# Patient Record
Sex: Female | Born: 1965 | Race: Black or African American | Hispanic: No | Marital: Married | Smoking: Never smoker
Health system: Southern US, Community
[De-identification: ages and names within clinical notes are randomized; demographics above are authoritative.]

## PROBLEM LIST (undated history)

## (undated) DIAGNOSIS — D219 Benign neoplasm of connective and other soft tissue, unspecified: Secondary | ICD-10-CM

## (undated) DIAGNOSIS — E559 Vitamin D deficiency, unspecified: Secondary | ICD-10-CM

## (undated) DIAGNOSIS — E669 Obesity, unspecified: Secondary | ICD-10-CM

## (undated) DIAGNOSIS — E039 Hypothyroidism, unspecified: Secondary | ICD-10-CM

## (undated) DIAGNOSIS — E049 Nontoxic goiter, unspecified: Secondary | ICD-10-CM

## (undated) DIAGNOSIS — G43909 Migraine, unspecified, not intractable, without status migrainosus: Secondary | ICD-10-CM

## (undated) DIAGNOSIS — R112 Nausea with vomiting, unspecified: Secondary | ICD-10-CM

## (undated) DIAGNOSIS — I10 Essential (primary) hypertension: Secondary | ICD-10-CM

## (undated) DIAGNOSIS — R51 Headache: Secondary | ICD-10-CM

## (undated) DIAGNOSIS — Z9889 Other specified postprocedural states: Secondary | ICD-10-CM

## (undated) HISTORY — DX: Nontoxic goiter, unspecified: E04.9

## (undated) HISTORY — PX: THYROIDECTOMY: SHX17

## (undated) HISTORY — PX: ABDOMINAL HYSTERECTOMY: SHX81

## (undated) HISTORY — DX: Migraine, unspecified, not intractable, without status migrainosus: G43.909

## (undated) HISTORY — DX: Essential (primary) hypertension: I10

## (undated) HISTORY — PX: DILATION AND CURETTAGE OF UTERUS: SHX78

## (undated) HISTORY — DX: Obesity, unspecified: E66.9

## (undated) HISTORY — DX: Benign neoplasm of connective and other soft tissue, unspecified: D21.9

## (undated) HISTORY — DX: Vitamin D deficiency, unspecified: E55.9

## (undated) HISTORY — DX: Headache: R51

---

## 1998-12-31 ENCOUNTER — Other Ambulatory Visit: Admission: RE | Admit: 1998-12-31 | Discharge: 1998-12-31 | Payer: Self-pay | Admitting: Obstetrics & Gynecology

## 2000-10-08 ENCOUNTER — Other Ambulatory Visit: Admission: RE | Admit: 2000-10-08 | Discharge: 2000-10-08 | Payer: Self-pay | Admitting: Obstetrics and Gynecology

## 2001-03-08 ENCOUNTER — Encounter: Admission: RE | Admit: 2001-03-08 | Discharge: 2001-03-08 | Payer: Self-pay | Admitting: Internal Medicine

## 2001-03-08 ENCOUNTER — Encounter: Payer: Self-pay | Admitting: Internal Medicine

## 2002-07-29 ENCOUNTER — Ambulatory Visit (HOSPITAL_COMMUNITY): Admission: RE | Admit: 2002-07-29 | Discharge: 2002-07-29 | Payer: Self-pay | Admitting: Obstetrics and Gynecology

## 2002-07-29 ENCOUNTER — Encounter (INDEPENDENT_AMBULATORY_CARE_PROVIDER_SITE_OTHER): Payer: Self-pay | Admitting: *Deleted

## 2002-08-08 ENCOUNTER — Other Ambulatory Visit: Admission: RE | Admit: 2002-08-08 | Discharge: 2002-08-08 | Payer: Self-pay | Admitting: Obstetrics and Gynecology

## 2003-10-18 ENCOUNTER — Other Ambulatory Visit: Admission: RE | Admit: 2003-10-18 | Discharge: 2003-10-18 | Payer: Self-pay | Admitting: Gynecology

## 2004-09-04 ENCOUNTER — Inpatient Hospital Stay (HOSPITAL_COMMUNITY): Admission: AD | Admit: 2004-09-04 | Discharge: 2004-09-04 | Payer: Self-pay | Admitting: Gynecology

## 2004-10-17 ENCOUNTER — Encounter (INDEPENDENT_AMBULATORY_CARE_PROVIDER_SITE_OTHER): Payer: Self-pay | Admitting: Specialist

## 2004-10-17 ENCOUNTER — Ambulatory Visit (HOSPITAL_BASED_OUTPATIENT_CLINIC_OR_DEPARTMENT_OTHER): Admission: RE | Admit: 2004-10-17 | Discharge: 2004-10-17 | Payer: Self-pay | Admitting: Gynecology

## 2004-10-17 ENCOUNTER — Ambulatory Visit (HOSPITAL_COMMUNITY): Admission: RE | Admit: 2004-10-17 | Discharge: 2004-10-17 | Payer: Self-pay | Admitting: Gynecology

## 2007-04-20 ENCOUNTER — Ambulatory Visit: Payer: Self-pay | Admitting: Internal Medicine

## 2007-04-20 LAB — CONVERTED CEMR LAB
AST: 17 units/L (ref 0–37)
Basophils Relative: 0.9 % (ref 0.0–1.0)
CO2: 31 meq/L (ref 19–32)
Creatinine, Ser: 0.7 mg/dL (ref 0.4–1.2)
Eosinophils Relative: 2 % (ref 0.0–5.0)
Glucose, Bld: 80 mg/dL (ref 70–99)
Lymphocytes Relative: 27.8 % (ref 12.0–46.0)
MCV: 83 fL (ref 78.0–100.0)
Monocytes Absolute: 0.7 10*3/uL (ref 0.2–0.7)
Monocytes Relative: 10.9 % (ref 3.0–11.0)
Neutrophils Relative %: 58.4 % (ref 43.0–77.0)
Potassium: 4 meq/L (ref 3.5–5.1)
RDW: 12.6 % (ref 11.5–14.6)
Sodium: 141 meq/L (ref 135–145)
Total Bilirubin: 0.7 mg/dL (ref 0.3–1.2)
Total Protein: 6.3 g/dL (ref 6.0–8.3)

## 2007-06-14 DIAGNOSIS — R51 Headache: Secondary | ICD-10-CM

## 2007-06-14 DIAGNOSIS — R519 Headache, unspecified: Secondary | ICD-10-CM | POA: Insufficient documentation

## 2007-06-14 DIAGNOSIS — I10 Essential (primary) hypertension: Secondary | ICD-10-CM

## 2007-06-14 HISTORY — DX: Headache: R51

## 2007-06-14 HISTORY — DX: Essential (primary) hypertension: I10

## 2008-02-02 ENCOUNTER — Encounter: Admission: RE | Admit: 2008-02-02 | Discharge: 2008-02-02 | Payer: Self-pay | Admitting: Internal Medicine

## 2008-05-11 ENCOUNTER — Telehealth: Payer: Self-pay | Admitting: Internal Medicine

## 2008-12-22 ENCOUNTER — Emergency Department (HOSPITAL_COMMUNITY): Admission: EM | Admit: 2008-12-22 | Discharge: 2008-12-22 | Payer: Self-pay | Admitting: Internal Medicine

## 2010-03-14 ENCOUNTER — Ambulatory Visit: Payer: Self-pay | Admitting: Internal Medicine

## 2010-03-14 DIAGNOSIS — E669 Obesity, unspecified: Secondary | ICD-10-CM

## 2010-03-14 HISTORY — DX: Obesity, unspecified: E66.9

## 2010-03-14 LAB — CONVERTED CEMR LAB
ALT: 18 units/L (ref 0–35)
BUN: 16 mg/dL (ref 6–23)
Basophils Relative: 0.3 % (ref 0.0–3.0)
Bilirubin, Direct: 0.2 mg/dL (ref 0.0–0.3)
CO2: 24 meq/L (ref 19–32)
Chloride: 106 meq/L (ref 96–112)
Cholesterol: 132 mg/dL (ref 0–200)
Creatinine, Ser: 0.7 mg/dL (ref 0.4–1.2)
Eosinophils Absolute: 0.2 10*3/uL (ref 0.0–0.7)
Eosinophils Relative: 1.9 % (ref 0.0–5.0)
HCT: 40.8 % (ref 36.0–46.0)
Lymphs Abs: 2.4 10*3/uL (ref 0.7–4.0)
MCHC: 34 g/dL (ref 30.0–36.0)
MCV: 82.9 fL (ref 78.0–100.0)
Monocytes Absolute: 0.7 10*3/uL (ref 0.1–1.0)
Neutrophils Relative %: 60.8 % (ref 43.0–77.0)
Platelets: 280 10*3/uL (ref 150.0–400.0)
Potassium: 5 meq/L (ref 3.5–5.1)
RBC: 4.93 M/uL (ref 3.87–5.11)
TSH: 1.45 microintl units/mL (ref 0.35–5.50)
Total Protein: 6.9 g/dL (ref 6.0–8.3)
Triglycerides: 68 mg/dL (ref 0.0–149.0)

## 2010-03-28 ENCOUNTER — Telehealth: Payer: Self-pay

## 2010-04-12 ENCOUNTER — Telehealth: Payer: Self-pay

## 2010-04-18 ENCOUNTER — Ambulatory Visit: Payer: Self-pay | Admitting: Internal Medicine

## 2010-11-10 ENCOUNTER — Encounter: Payer: Self-pay | Admitting: Internal Medicine

## 2010-11-21 NOTE — Assessment & Plan Note (Signed)
Summary: cpx-will fast//ccm   Vital Signs:  Patient profile:   45 year old female Height:      63.75 inches Weight:      239 pounds BMI:     41.50 Temp:     98.4 degrees F oral BP sitting:   120 / 80  (right arm) Cuff size:   regular  Vitals Entered By: Duard Brady LPN (Mar 14, 2010 8:54 AM) CC: cpx - has gyn , c/o migraines , wants to discuss wt loss Is Patient Diabetic? No   CC:  cpx - has gyn , c/o migraines , and wants to discuss wt loss.  History of Present Illness:  45 year old patient who is seen today for a health  maintenance examination.  She has a long history of treated hypertension.  She presently is well controlled on metoprolol 50 mg daily.  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 Her main concern is frequent migraine headaches.  These have intensified of late and has missed several days of work.  She has been on preventive medications in the past.  Presently, she is using anti-inflammatories, and Excedrin Migraine that no longer are providing much benefit  Preventive Screening-Counseling & Management  Alcohol-Tobacco     Smoking Status: never  Allergies (verified): No Known Drug Allergies  Past History:  Past Medical History: Headache-migraine Hypertension exogenous obesity  Past Surgical History: Reviewed history from 06/14/2007 and no changes required. G2 P2 A0 D&C x 2  Family History: Reviewed history and no changes required. father in good health mother history of hypertension, type 2 diabetes two brothers, one sister positive for hypertension and afor  Social History: Reviewed history and no changes required. works as a Lawyer at [employer] Status:  never  Review of Systems       The patient complains of weight gain and headaches.  The patient denies anorexia, fever, weight loss, vision loss, decreased hearing, hoarseness, chest pain, syncope, dyspnea on exertion,  peripheral edema, prolonged cough, hemoptysis, abdominal pain, melena, hematochezia, severe indigestion/heartburn, hematuria, incontinence, genital sores, muscle weakness, suspicious skin lesions, transient blindness, difficulty walking, depression, unusual weight change, abnormal bleeding, enlarged lymph nodes, angioedema, and breast masses.    Physical Exam  General:  overweight-appearing.  124/80overweight-appearing.   Head:  Normocephalic and atraumatic without obvious abnormalities. No apparent alopecia or balding. Eyes:  No corneal or conjunctival inflammation noted. EOMI. Perrla. Funduscopic exam benign, without hemorrhages, exudates or papilledema. Vision grossly normal. Ears:  External ear exam shows no significant lesions or deformities.  Otoscopic examination reveals clear canals, tympanic membranes are intact bilaterally without bulging, retraction, inflammation or discharge. Hearing is grossly normal bilaterally. Nose:  External nasal examination shows no deformity or inflammation. Nasal mucosa are pink and moist without lesions or exudates. Mouth:  Oral mucosa and oropharynx without lesions or exudates.  Teeth in good repair. Neck:  thyromegaly Chest Wall:  No deformities, masses, or tenderness noted. Lungs:  Normal respiratory effort, chest expands symmetrically. Lungs are clear to auscultation, no crackles or wheezes. Heart:  Normal rate and regular rhythm. S1 and S2 normal without gallop, murmur, click, rub or other extra sounds. Abdomen:  Bowel sounds positive,abdomen soft and non-tender without masses, organomegaly or hernias noted. Msk:  No deformity or scoliosis noted of thoracic or lumbar spine.   Pulses:  R and L carotid,radial,femoral,dorsalis pedis and posterior tibial pulses are full and equal bilaterally Extremities:  No clubbing, cyanosis, edema,  or deformity noted with normal full range of motion of all joints.   Neurologic:  alert & oriented X3, cranial nerves II-XII  intact, strength normal in all extremities, sensation intact to light touch, and gait normal.  alert & oriented X3, cranial nerves II-XII intact, strength normal in all extremities, sensation intact to light touch, and gait normal.   Skin:  Intact without suspicious lesions or rashes Cervical Nodes:  No lymphadenopathy noted Axillary Nodes:  No palpable lymphadenopathy Inguinal Nodes:  No significant adenopathy Psych:  Cognition and judgment appear intact. Alert and cooperative with normal attention span and concentration. No apparent delusions, illusions, hallucinations   Impression & Recommendations:  Problem # 1:  HYPERTENSION (ICD-401.9)  Her updated medication list for this problem includes:    Metoprolol Tartrate 50 Mg Tabs (Metoprolol tartrate) .Marland Kitchen... Take 1 tablet by mouth once a day  Her updated medication list for this problem includes:    Metoprolol Tartrate 50 Mg Tabs (Metoprolol tartrate) .Marland Kitchen... Take 1 tablet by mouth once a day  Problem # 2:  HEADACHE (ICD-784.0)  Her updated medication list for this problem includes:    Metoprolol Tartrate 50 Mg Tabs (Metoprolol tartrate) .Marland Kitchen... Take 1 tablet by mouth once a day    Sumatriptan Succinate 50 Mg Tabs (Sumatriptan succinate) ..... One tablet as needed for migraine headaches  Her updated medication list for this problem includes:    Metoprolol Tartrate 50 Mg Tabs (Metoprolol tartrate) .Marland Kitchen... Take 1 tablet by mouth once a day    Sumatriptan Succinate 50 Mg Tabs (Sumatriptan succinate) ..... One tablet as needed for migraine headaches  Problem # 3:  EXOGENOUS OBESITY (ICD-278.00)  Complete Medication List: 1)  Metoprolol Tartrate 50 Mg Tabs (Metoprolol tartrate) .... Take 1 tablet by mouth once a day 2)  Topiramate 50 Mg Tabs (Topiramate) .... One at bedtime for 7 days, then one twice daily for 7 days, then one in the morning and two at bedtime for 7 days 3)  Topiramate 100 Mg Tabs (Topiramate) .... One twice daily 4)   Sumatriptan Succinate 50 Mg Tabs (Sumatriptan succinate) .... One tablet as needed for migraine headaches  Other Orders: Venipuncture (16109) TLB-Lipid Panel (80061-LIPID) TLB-BMP (Basic Metabolic Panel-BMET) (80048-METABOL) TLB-CBC Platelet - w/Differential (85025-CBCD) TLB-Hepatic/Liver Function Pnl (80076-HEPATIC) TLB-TSH (Thyroid Stimulating Hormone) (60454-UJW)  Patient Instructions: 1)  Please schedule a follow-up appointment in 6 weeks 2)  Limit your Sodium (Salt) to less than 2 grams a day(slightly less than 1/2 a teaspoon) to prevent fluid retention, swelling, or worsening of symptoms. 3)  It is important that you exercise regularly at least 20 minutes 5 times a week. If you develop chest pain, have severe difficulty breathing, or feel very tired , stop exercising immediately and seek medical attention. 4)  You need to lose weight. Consider a lower calorie diet and regular exercise.  Prescriptions: SUMATRIPTAN SUCCINATE 50 MG TABS (SUMATRIPTAN SUCCINATE) one tablet as needed for migraine headaches  #6 x 6   Entered and Authorized by:   Gordy Savers  MD   Signed by:   Gordy Savers  MD on 03/14/2010   Method used:   Electronically to        [facility]* (retail)       [address]       [address] Shipping/mailing       [place], [place]  [address]       Ph: [phone]       Fax:  [phone]   RxID:   [id number] TOPIRAMATE 100 MG TABS (TOPIRAMATE) one twice daily  #180 x 2   Entered and Authorized by:   Gordy Savers  MD   Signed by:   Gordy Savers  MD on 03/14/2010   Method used:   Electronically to        [facility]* (retail)       [address]       [address] Shipping/mailing       [place], [place]  [address]       Ph: [phone]       Fax: [phone]   RxID:   [id number] TOPIRAMATE 50 MG TABS (TOPIRAMATE) one at bedtime for 7 days, then one twice daily for 7 days, then one in the  morning and two at bedtime for 7 days  #42 x 0   Entered and Authorized by:   Gordy Savers  MD   Signed by:   Gordy Savers  MD on 03/14/2010   Method used:   Electronically to        [facility]* (retail)       [address]       [address] Shipping/mailing       [place], [place]  [address]       Ph: [phone]       Fax: [phone]   RxID:   [id number] METOPROLOL TARTRATE 50 MG  TABS (METOPROLOL TARTRATE) Take 1 tablet by mouth once a day  #90 x 4   Entered and Authorized by:   Gordy Savers  MD   Signed by:   Gordy Savers  MD on 03/14/2010   Method used:   Electronically to        [facility]* (retail)       [address]       [address] Shipping/mailing       [place], [place]  [address]       Ph: [phone]       Fax: [phone]   RxID:   [id number]

## 2010-11-21 NOTE — Assessment & Plan Note (Signed)
Summary: 6 week fup//ccm   Vital Signs:  Patient profile:   45 year old female Weight:      240 pounds Temp:     97.7 degrees F oral BP sitting:   110 / 80  (right arm) Cuff size:   regular  Vitals Entered By: Duard Brady LPN (April 18, 2010 8:34 AM) CC: 6 wk rov - better Is Patient Diabetic? No   CC:  6 wk rov - better.  History of Present Illness: 45 year old patient who is seen today for follow-up of her migraine headaches.  she was placed on Topamax last visit and has up titrated her medication to 100 mg b.i.d. she has treated hypertension, which has been stable.  This is well controlled on metoprolol.  Headaches are much improved.  They occur just twice a week now rather than 4 to 5 times weekly and are much less severe  Preventive Screening-Counseling & Management  Alcohol-Tobacco     Smoking Status: never  Allergies (verified): No Known Drug Allergies  Past History:  Past Medical History: Reviewed history from 03/14/2010 and no changes required. Headache-migraine Hypertension exogenous obesity  Review of Systems       The patient complains of headaches.  The patient denies anorexia, fever, weight loss, weight gain, vision loss, decreased hearing, hoarseness, chest pain, syncope, dyspnea on exertion, peripheral edema, prolonged cough, hemoptysis, abdominal pain, melena, hematochezia, severe indigestion/heartburn, hematuria, incontinence, genital sores, muscle weakness, suspicious skin lesions, transient blindness, difficulty walking, depression, unusual weight change, abnormal bleeding, enlarged lymph nodes, angioedema, and breast masses.    Physical Exam  General:  overweight-appearing.  110/70overweight-appearing.   Neck:  No deformities, masses, or tenderness noted. Lungs:  Normal respiratory effort, chest expands symmetrically. Lungs are clear to auscultation, no crackles or wheezes. Heart:  Normal rate and regular rhythm. S1 and S2 normal without gallop,  murmur, click, rub or other extra sounds.   Impression & Recommendations:  Problem # 1:  HYPERTENSION (ICD-401.9)  Her updated medication list for this problem includes:    Metoprolol Tartrate 50 Mg Tabs (Metoprolol tartrate) .Marland Kitchen... Take 1 tablet by mouth once a day  Her updated medication list for this problem includes:    Metoprolol Tartrate 50 Mg Tabs (Metoprolol tartrate) .Marland Kitchen... Take 1 tablet by mouth once a day  Problem # 2:  HEADACHE (ICD-784.0)  Her updated medication list for this problem includes:    Metoprolol Tartrate 50 Mg Tabs (Metoprolol tartrate) .Marland Kitchen... Take 1 tablet by mouth once a day    Sumatriptan Succinate 50 Mg Tabs (Sumatriptan succinate) ..... One tablet as needed for migraine headaches  Her updated medication list for this problem includes:    Metoprolol Tartrate 50 Mg Tabs (Metoprolol tartrate) .Marland Kitchen... Take 1 tablet by mouth once a day    Sumatriptan Succinate 50 Mg Tabs (Sumatriptan succinate) ..... One tablet as needed for migraine headaches  Complete Medication List: 1)  Metoprolol Tartrate 50 Mg Tabs (Metoprolol tartrate) .... Take 1 tablet by mouth once a day 2)  Topiramate 50 Mg Tabs (Topiramate) .... One at bedtime for 7 days, then one twice daily for 7 days, then one in the morning and two at bedtime for 7 days 3)  Topiramate 100 Mg Tabs (Topiramate) .... One twice daily 4)  Sumatriptan Succinate 50 Mg Tabs (Sumatriptan succinate) .... One tablet as needed for migraine headaches  Patient Instructions: 1)  Please schedule a follow-up appointment in 3 months. 2)  Limit your Sodium (Salt) to less than 2  grams a day(slightly less than 1/2 a teaspoon) to prevent fluid retention, swelling, or worsening of symptoms. 3)  It is important that you exercise regularly at least 20 minutes 5 times a week. If you develop chest pain, have severe difficulty breathing, or feel very tired , stop exercising immediately and seek medical attention. 4)  You need to lose weight.  Consider a lower calorie diet and regular exercise.

## 2010-11-21 NOTE — Progress Notes (Signed)
Summary: ppwk ready for pick up  Phone Note Outgoing Call   Call placed by: Duard Brady LPN,  March 28, 8118 9:24 AM Call placed to: Patient Summary of Call: FMLA ppwk ready for pick up - ans mach. KIK Initial call taken by: Duard Brady LPN,  March 28, 1477 9:24 AM

## 2010-11-21 NOTE — Progress Notes (Signed)
Summary: lab results needed  Phone Note Call from Patient Call back at [phone]   Caller: Patient---voicemail Reason for Call: Talk to Nurse, Lab or Test Results Summary of Call: need lab results. Initial call taken by: Warnell Forester,  April 12, 2010 1:02 PM  Follow-up for Phone Call        Informed pt Dr Kirtland Bouchard out of the office, 5/26 labs no comment.  She will get a call early next with results when he returns Follow-up by: Sid Falcon LPN,  April 12, 2010 3:31 PM  Additional Follow-up for Phone Call Additional follow up Details #1::        called pt about labs - all WNL   KIK Additional Follow-up by: Duard Brady LPN,  April 15, 2010 9:08 AM

## 2011-01-30 LAB — CBC
Platelets: 280 10*3/uL (ref 150–400)
RBC: 5.51 MIL/uL — ABNORMAL HIGH (ref 3.87–5.11)
WBC: 9.1 10*3/uL (ref 4.0–10.5)

## 2011-01-30 LAB — BASIC METABOLIC PANEL
BUN: 10 mg/dL (ref 6–23)
Calcium: 9.3 mg/dL (ref 8.4–10.5)
Chloride: 102 mEq/L (ref 96–112)
Creatinine, Ser: 0.84 mg/dL (ref 0.4–1.2)
GFR calc Af Amer: >60 mL/min (ref >60)
GFR calc non Af Amer: >60 mL/min (ref >60)

## 2011-01-30 LAB — DIFFERENTIAL
Eosinophils Absolute: 0 10*3/uL (ref 0.0–0.7)
Lymphocytes Relative: 21 % (ref 12–46)
Lymphs Abs: 1.9 10*3/uL (ref 0.7–4.0)
Monocytes Relative: 6 % (ref 3–12)
Neutro Abs: 6.6 10*3/uL (ref 1.7–7.7)
Neutrophils Relative %: 72 % (ref 43–77)

## 2011-01-30 LAB — POCT CARDIAC MARKERS
Myoglobin, poc: 149 ng/mL (ref 12–200)
Troponin i, poc: <0.05 ng/mL (ref 0.00–0.09)

## 2011-01-30 LAB — D-DIMER, QUANTITATIVE: D-Dimer, Quant: 0.83 ug/mL-FEU — ABNORMAL HIGH (ref 0.00–0.48)

## 2011-01-30 LAB — PREGNANCY, URINE: Preg Test, Ur: NEGATIVE

## 2011-03-04 NOTE — Assessment & Plan Note (Signed)
[name]                            [facility] OFFICE NOTE   [name], [name]                     MRN:          [id number]  DATE:04/20/2007                            DOB:          [birth year]    A 45 year old [place] American female seen today to establish with our  practice.  She has a history of gestational hypertension, and over the  past few months has been on metoprolol for blood pressure control.  She  presented to a local Urgent Care, and was prescribed the medication.  She presents with a several day history of increasing pedal edema, but  this is actually improved, and has since resolved over the past 2 days.  She has history of migraine headache since 16, and hypertension for 12  years.  She is gravida 2 para 2 abortus zero.  Status post C-section x2.  She has also had D and C in 2003 and 2005.   REVIEW OF SYSTEMS:  Otherwise negative.  She is followed at the [facility].  Her only other concern is a couple month history of some  bloating, constipation predominantly, but some loose diarrheal stool.  She does have migraines once or twice a week, but seem well controlled  with Excedrin Migraine and coffee.   FAMILY HISTORY:  Family in his early 14s in good health.  Mother age 18,  and has hypertension and diabetes.  Two brothers with hypertension.  One  sister as well.   EXAMINATION:  Revealed an overweight black female.  Weight 236.  Blood  pressure 120/76.  SKIN:  Negative.  Fundi, ears, nose, and throat clear.  NECK:  Revealed some mild thyroid enlargement, otherwise negative.  CHEST:  Clear.  CARDIOVASCULAR:  Normal heart sounds.  No murmurs.  ABDOMEN:  Obese, soft, and non-tender.  No organomegaly.  Bowel sounds  normal.  EXTREMITIES:  Negative.  No significant edema.  Peripheral pulses  intact.   IMPRESSION:  1. Hypertension.  2. History of pedal edema.  3. Exogenous obesity.  4. Mild function gastrointestinal  symptoms.   DISPOSITION:  1. Metoprolol will be discontinued, and the patient will be placed on      hydrochlorothiazide.  Low-salt diet discussed.  Laboratory profile      reviewed.  2. Recheck in 3 months.     Gordy Savers, MD  Electronically Signed   PFK/MedQ  DD: 04/20/2007  DT: 04/20/2007  Job #: [id number]

## 2011-03-07 NOTE — H&P (Signed)
NAMEAYDEN, [name]              ACCOUNT NO.:  [id number]   MEDICAL RECORD NO.:  [id number]          PATIENT TYPE:  AMB   LOCATION:  [facility]                         FACILITY:  [facility]   PHYSICIAN:  Ivor Costa. Farrel Gobble, M.D. DATE OF BIRTH:  1966/08/13   DATE OF ADMISSION:  10/17/2004  DATE OF DISCHARGE:                                HISTORY & PHYSICAL   PRINCIPAL DIAGNOSIS:  Dysfunctional bleeding.   HISTORY OF PRESENT ILLNESS:  The patient is a 45 year old G3, P2 who had  been on birth control pills for a number of years.  She discontinued them  earlier this year and presented to our office in September complaining of a  menses that had lasted approximately four weeks.  She had been on the patch  but, as mentioned, had discontinued it.  A TSH and prolactin was done at  that time and it was normal.  Because of the patient's obesity, she returned  back to the office for an endometrial biopsy which showed simple hyperplasia  without atypia.  Based on biopsy, the patient was placed on progestin with  the idea that she would use backup contraception as she does not want to go  back on a birth control pill.  She was put on Provera 10 mg for 10 days per  month with the idea that she would return back for a repeat biopsy.  The  patient unfortunately continued to have dysfunctional bleeding.  She was  seen in the emergency room in the beginning of November for similar very  heavy bleeding and was placed on Aygestin 5 mg t.i.d.  While on the t.i.d.  dose, the patient did stop bleeding; however, as she began to do her taper  she started bleeding and when she went back to a birth control pill she  started bleeding even heavier.  Of note, the patient had a D&C in 2003 for  some more bleeding, the pathology is not available at the time of this  dictation.  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  The patient has been retreated twice  now and has continued on the Aygestin until the procedure.  She is  presenting now for [facility] hysteroscopy for the dysfunctional bleeding.   PAST OBGYN HISTORY:  She had been contracepting with birth control pills, as  mentioned above.  She has had two C-sections and 1 miscarriage.  She has had  normal Pap Smears.   PAST MEDICAL HISTORY:  Negative.   SURGICAL HISTORY:  C-sections as mentioned above.   ALLERGIES:  She has NO KNOWN DRUG ALLERGIES.   SOCIAL HISTORY:  She is married.  No alcohol, tobacco or caffeine.  She does  not exercise on a formal basis.   FAMILY HISTORY:  Negative for gynecologic cancers.   ASSESSMENT:  Dysfunctional bleeding that failed conservative management with  hormones.  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  The patient will present for a dilation and curettage hysteroscopy  on the  morning of October 17, 2004.    Trac  THL/MEDQ  D:  10/16/2004  T:  10/16/2004  Job:  [id number]

## 2011-03-07 NOTE — Op Note (Signed)
   NAME:  [name], [name]                        ACCOUNT NO.:  [id number]   MEDICAL RECORD NO.:  [id number]                   PATIENT TYPE:  AMB   LOCATION:  [facility]                                  FACILITY:  [facility]   PHYSICIAN:  Maxie Better, MD              DATE OF BIRTH:  March 22, 1966   DATE OF PROCEDURE:  07/29/2002  DATE OF DISCHARGE:                                 OPERATIVE REPORT   PREOPERATIVE DIAGNOSIS:  Dysfunctional uterine bleeding.   PROCEDURE:  Dilatation and curettage.   POSTOPERATIVE DIAGNOSIS:  Dysfunctional uterine bleeding.   ANESTHESIA:  MAC, paracervical block.   INDICATIONS FOR PROCEDURE:  This is a 45 year old para 2 female who has had  ongoing bleeding since August of 2003 which is unresponsive to hormonal  therapy who now presents for surgical management.  Risks and benefits of  procedure have been explained to the patient.  Consent was signed.  The  patient was transferred to the operating room.   PROCEDURE:  Under adequate monitored anesthesia, the patient was placed in  the dorsal lithotomy position.  She was sterilely prepped and draped in the  usual fashion.  The bladder was catheterized for a small amount of urine.  Examination under anesthesia revealed a normal size, anteverted uterus.  There was a palpable subserosal 1-cm fibroid in the lower uterine segment on  the left.  No adnexal masses could be appreciated.  Bivalve speculum was  placed in the vagina.  Nesacaine 1% 21 cc was injected paracervically.  The  anterior lip of the cervix was grasped with a single-tooth tenaculum.  The  patient had had dark red blood in the vault.  The cervical os was dilated up  to 27 Pratt dilator and a medium sized curette was then used to curette the  uterine cavity, which was noted to be smooth.  Moderate amount of tissue was  obtained.  The cavity was then curetted further and then all instruments  were then removed from the vagina.  Specimen labeled  endometrial curetting  was sent to pathology.  Estimated blood loss was minimal.  Complications  were none.  The patient tolerated the procedure well, was transferred to the  recovery room in stable condition.                                                Maxie Better, MD    [place]/MEDQ  D:  07/29/2002  T:  07/29/2002  Job:  [id number]

## 2011-03-07 NOTE — Op Note (Signed)
[name], [name]              ACCOUNT NO.:  [id number]   MEDICAL RECORD NO.:  [id number]          PATIENT TYPE:  AMB   LOCATION:  [facility]                         [facility]ACILITY[facility]   PHYSICIAN:  Ivor Costa. Farrel Gobble, M.D. DATE OF BIRTH:  1966/01/19   DATE OF PROCEDURE:  10/17/2004  DATE OF DISCHARGE:                                 OPERATIVE REPORT   PREOPERATIVE DIAGNOSES:  1.  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  2.  Dysfunctional bleeding.   POSTOPERATIVE DIAGNOSES:  1.  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  2.  Dysfunctional bleeding.   PROCEDURE:  D&C hysteroscopy with directed biopsy.   SURGEON:  Ivor Costa. Farrel Gobble, M.D.   ANESTHESIA:  General anesthesia.   ESTIMATED BLOOD LOSS:  None.   I&O DEFICIT:  30%.   SORBITOL SOLUTION:  Zero.   FINDINGS:  Anteverted and large, sounding 7 cm hysteroscopically the lining  was shaggy without any gross defects, polyps or synechiae appreciated.   PATHOLOGY:  Endometrial curettings and directed biopsies.   DESCRIPTION OF PROCEDURE:  The patient was taken to the operating room and  general anesthesia was induced.  She was placed in the dorsal lithotomy  position, prepped and draped in usual sterile fashion.  Bimanual examination  was performed.  Orientation of the uterus was confirmed.  Sterile weighted  speculum was placed in the vagina.  The cervix was visualized to [name] with  single-tooth tenaculum and the cervix was injected with 12 mL of dilute  Pitressin solution.  The cervix was then gently dilated up to 35 [place]  after which the operator's hysteroscope was advanced through the cervix  toward the fundus.  The findings were as mentioned above.  The hysteroscope  was removed and sharp curettage was performed.  This was sent as a separate  specimen.  The hysteroscope was then replaced.  There was an area of concern  at approximately 10 o'clock  anteriorly that was directly biopsied.  Then  biopsies of the posterior, anterior and left side wall were also taken as  well as across the fundus.  The instruments were then removed.  The patient  tolerated the procedure well.  Sponge, lap and needle counts correct x2.  She was transferred to the PACU in stable condition.     Trac   THL/MEDQ  D:  10/17/2004  T:  10/17/2004  Job:  [id number]

## 2011-03-07 NOTE — Consult Note (Signed)
[name], [name]              ACCOUNT NO.:  [id number]   MEDICAL RECORD NO.:  [id number]          PATIENT TYPE:  MAT   LOCATION:  [facility]                          FACILITY:  [facility]   PHYSICIAN:  Ivor Costa. Farrel Gobble, M.D. DATE OF BIRTH:  05/02/1966   DATE OF CONSULTATION:  09/04/2004  DATE OF DISCHARGE:                                   CONSULTATION   CHIEF COMPLAINT:  Dysfunctional bleeding.   HISTORY OF PRESENT ILLNESS:  The patient is a G3, P2 who was supposed to be  seen in the office today for dysfunctional bleeding, however, had very heavy  bleeding at work and as I was unavailable the patient elected to come to  triage. The patient states that she was feeling very dizzy, short of breath  and was saturating her clothing. She was essentially pouring out blood  from her uterus.  She has had dysfunctional bleeding for approximately three  months now. Prior to this, the patient had been on oral contraceptives and  failed to withdraw bleed and was not happy with that.  She stopped the oral  contraceptives and then began having dysfunctional bleeding. Because of the  bleeding abnormality that she has been having on and off for the past three  months, the endometrial biopsy was performed in the office last week which  came back with endometrial hyperplasia without atypia. The patient initially  was placed on oral contraceptives but was switched over to Provera 10 mg  q.d.  She has taken eight of these tablets and has failed to respond and was  suppose to come in for a consultation at this point.   PAST OB/GYN HISTORY:  Significant for a C section x2 and a SAB.   PAST GYNEC0LOGICAL HISTORY:  The patient has no history of fibroids and  prior to this had no history of dysfunctional bleeding.  She is currently  contracepting with condoms.  Of note, she had a negative pregnancy test  prior to her endometrial biopsy last week.   PAST MEDICAL HISTORY:  Negative.   PAST SURGICAL HISTORY:  C  sections and D&C.   ALLERGIES:  Negative.   PHYSICAL EXAMINATION:  GENERAL:  She is well appearing in no acute distress.  VITAL SIGNS:  Her blood pressure lying is 111/69, sitting is 115/72,  standing is 110/64.  Pulse was 81, 92 and 88 respectively.  ABDOMEN:  Obese, soft and nontender.  PELVIC:  On speculum exam, there is dark, nonclotted blood coming from the  cervix and in the vagina when wiped away and the patient was asked to give a  gentle Valsalva. There was a thin trickle coming out of the os. Bimanual  exam there is no cervical motion tenderness, the uterus is slightly  enlarged, mobile and nontender as are the adnexa. The patient underwent a  GYN ultrasound with limited report at this point. Her endometrium is 10 mm,  no fibroids are appreciated. The ovaries are negative. CBC is pending at the  time of this dictation.   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  The patient was  offered to either be observed overnight with oral  Aygestin or to return home with oral Aygestin and to followup in the office  with [name] at least by telephone call tomorrow to let us know how her bleeding  has responded. She elects for the latter. She will take Aygestin 5mg  t.i.d.  until her bleeding slow to stop followed by b.i.d. for three days followed  by q.d.  for one week. She is aware that she may start bleeding at the end of this.  She will then take either progestin or birth control pills for protection of  her uterus and rebiopsy in three months.  The patient's prescription was  called in to the [facility] on [address].     Trac   THL/MEDQ  D:  09/04/2004  T:  09/05/2004  Job:  [id number]

## 2011-04-16 ENCOUNTER — Other Ambulatory Visit: Payer: Self-pay | Admitting: Internal Medicine

## 2011-05-27 ENCOUNTER — Other Ambulatory Visit: Payer: Self-pay | Admitting: Internal Medicine

## 2011-05-28 ENCOUNTER — Other Ambulatory Visit: Payer: Self-pay

## 2011-05-28 MED ORDER — TOPIRAMATE 100 MG PO TABS: 100.0000 mg | ORAL_TABLET | Freq: Two times a day (BID) | ORAL | Status: DC

## 2011-05-28 NOTE — Telephone Encounter (Signed)
Faxed back to [employer]

## 2011-08-13 ENCOUNTER — Encounter: Payer: Self-pay | Admitting: Internal Medicine

## 2011-08-14 ENCOUNTER — Ambulatory Visit (INDEPENDENT_AMBULATORY_CARE_PROVIDER_SITE_OTHER): Payer: 59 | Admitting: Internal Medicine

## 2011-08-14 ENCOUNTER — Encounter: Payer: Self-pay | Admitting: Internal Medicine

## 2011-08-14 VITALS — BP 128/82 | Temp 98.1°F | Wt 242.0 lb

## 2011-08-14 DIAGNOSIS — R51 Headache: Secondary | ICD-10-CM

## 2011-08-14 DIAGNOSIS — Z0000 Encounter for general adult medical examination without abnormal findings: Secondary | ICD-10-CM

## 2011-08-14 DIAGNOSIS — Z23 Encounter for immunization: Secondary | ICD-10-CM

## 2011-08-14 DIAGNOSIS — I10 Essential (primary) hypertension: Secondary | ICD-10-CM

## 2011-08-14 DIAGNOSIS — E669 Obesity, unspecified: Secondary | ICD-10-CM

## 2011-08-14 NOTE — Progress Notes (Signed)
  Subjective:    Patient ID: [name], female    DOB: 26-Oct-1965, 45 y.o.   MRN: [id number]  HPI  45 year old patient who is seen today for followup of her hypertension. She has migraine headaches. She is requesting a change in medication. She feels the Imitrex is no longer quite as effective. She remains on Topamax for migraine prevention. She is also on metoprolol for blood pressure control. Blood pressure has been nicely controlled. She continues to have migraines she states that these occur once or twice per week. She is now working the day shift rather than a night shift at [employer]. She feels this has helped her migraine syndrome. She still misses 4-6 working days per year and requests FMLA form completion. She has a history of exogenous obesity and is requesting a dietary referral  Wt Readings from Last 3 Encounters:  08/14/11 242 lb (109.77 kg)  04/18/10 240 lb (108.863 kg)  03/14/10 239 lb (108.41 kg)    Review of Systems  Constitutional: Negative.   HENT: Negative for hearing loss, congestion, sore throat, rhinorrhea, dental problem, sinus pressure and tinnitus.   Eyes: Negative for pain, discharge and visual disturbance.  Respiratory: Negative for cough and shortness of breath.   Cardiovascular: Negative for chest pain, palpitations and leg swelling.  Gastrointestinal: Negative for nausea, vomiting, abdominal pain, diarrhea, constipation, blood in stool and abdominal distention.  Genitourinary: Negative for dysuria, urgency, frequency, hematuria, flank pain, vaginal bleeding, vaginal discharge, difficulty urinating, vaginal pain and pelvic pain.  Musculoskeletal: Negative for joint swelling, arthralgias and gait problem.  Skin: Negative for rash.  Neurological: Positive for headaches. Negative for dizziness, syncope, speech difficulty, weakness and numbness.  Hematological: Negative for adenopathy.  Psychiatric/Behavioral: Negative for behavioral problems,  dysphoric mood and agitation. The patient is not nervous/anxious.        Objective:   Physical Exam  Constitutional: She is oriented to person, place, and time. She appears well-developed and well-nourished.       Weight 242. Blood pressure 120/82  HENT:  Head: Normocephalic.  Right Ear: External ear normal.  Left Ear: External ear normal.  Mouth/Throat: Oropharynx is clear and moist.  Eyes: Conjunctivae and EOM are normal. Pupils are equal, round, and reactive to light.  Neck: Normal range of motion. Neck supple. No thyromegaly present.  Cardiovascular: Normal rate, regular rhythm, normal heart sounds and intact distal pulses.   Pulmonary/Chest: Effort normal and breath sounds normal.  Abdominal: Soft. Bowel sounds are normal. She exhibits no mass. There is no tenderness.  Musculoskeletal: Normal range of motion.  Lymphadenopathy:    She has no cervical adenopathy.  Neurological: She is alert and oriented to person, place, and time.  Skin: Skin is warm and dry. No rash noted.  Psychiatric: She has a normal mood and affect. Her behavior is normal.          Assessment & Plan:   Hypertension well controlled Exogenous obesity. Will refer to dietary Migraine headaches. Will give samples of alternative tryptans. We'll continue Topamax prophylaxis  Low salt diet encouraged weight loss exercise regimen discussed FMLA forms completed

## 2011-08-14 NOTE — Patient Instructions (Signed)

## 2011-08-26 ENCOUNTER — Telehealth: Payer: Self-pay | Admitting: Internal Medicine

## 2011-08-26 NOTE — Telephone Encounter (Signed)
Please advise 

## 2011-08-26 NOTE — Telephone Encounter (Signed)
Pt called and said that the sample meds given for migraines is not strong enough. Pt is req a different med to be called in to [facility].

## 2011-08-27 MED ORDER — ELETRIPTAN HYDROBROMIDE 40 MG PO TABS: 40.0000 mg | ORAL_TABLET | ORAL | Status: DC | PRN

## 2011-08-27 NOTE — Telephone Encounter (Signed)
Attempt to call - VM - [id number] if questions new med faxed [facility]

## 2011-08-27 NOTE — Telephone Encounter (Signed)
relpax 40 mg  #6  RF 6

## 2011-09-25 ENCOUNTER — Other Ambulatory Visit: Payer: Self-pay | Admitting: Internal Medicine

## 2011-12-15 ENCOUNTER — Ambulatory Visit (INDEPENDENT_AMBULATORY_CARE_PROVIDER_SITE_OTHER): Payer: 59 | Admitting: Internal Medicine

## 2011-12-15 ENCOUNTER — Encounter: Payer: Self-pay | Admitting: Internal Medicine

## 2011-12-15 DIAGNOSIS — J069 Acute upper respiratory infection, unspecified: Secondary | ICD-10-CM

## 2011-12-15 DIAGNOSIS — I10 Essential (primary) hypertension: Secondary | ICD-10-CM

## 2011-12-15 MED ORDER — HYDROCODONE-HOMATROPINE 5-1.5 MG/5ML PO SYRP: 5.0000 mL | ORAL_SOLUTION | Freq: Four times a day (QID) | ORAL | Status: AC | PRN

## 2011-12-15 NOTE — Progress Notes (Signed)
  Subjective:    Patient ID: [name], female    DOB: 1966/03/08, 46 y.o.   MRN: [id number]  HPI  46 year old patient who presents with a three-day history of low-grade fever myalgias with low back pain she has had headache nonproductive cough rhinorrhea and a general sense of unwellness. She has been using ibuprofen and Alka-Seltzer plus.    Review of Systems  Constitutional: Positive for fever, activity change, appetite change and fatigue.  HENT: Positive for rhinorrhea and sneezing. Negative for hearing loss, congestion, sore throat, dental problem, sinus pressure and tinnitus.   Eyes: Negative for pain, discharge and visual disturbance.  Respiratory: Positive for cough. Negative for shortness of breath.   Cardiovascular: Negative for chest pain, palpitations and leg swelling.  Gastrointestinal: Negative for nausea, vomiting, abdominal pain, diarrhea, constipation, blood in stool and abdominal distention.  Genitourinary: Negative for dysuria, urgency, frequency, hematuria, flank pain, vaginal bleeding, vaginal discharge, difficulty urinating, vaginal pain and pelvic pain.  Musculoskeletal: Positive for myalgias and back pain. Negative for joint swelling, arthralgias and gait problem.  Skin: Negative for rash.  Neurological: Negative for dizziness, syncope, speech difficulty, weakness, numbness and headaches.  Hematological: Negative for adenopathy.  Psychiatric/Behavioral: Negative for behavioral problems, dysphoric mood and agitation. The patient is not nervous/anxious.        Objective:   Physical Exam  Constitutional: She is oriented to person, place, and time. She appears well-developed and well-nourished.       Overweight. No distress. Temperature 99.5  HENT:  Head: Normocephalic.  Right Ear: External ear normal.  Left Ear: External ear normal.  Mouth/Throat: Oropharynx is clear and moist.  Eyes: Conjunctivae and EOM are normal. Pupils are equal, round, and reactive to  light.  Neck: Normal range of motion. Neck supple. No thyromegaly present.  Cardiovascular: Normal rate, regular rhythm, normal heart sounds and intact distal pulses.   Pulmonary/Chest: Effort normal and breath sounds normal.  Abdominal: Soft. Bowel sounds are normal. She exhibits no mass. There is no tenderness.  Musculoskeletal: Normal range of motion.  Lymphadenopathy:    She has no cervical adenopathy.  Neurological: She is alert and oriented to person, place, and time.  Skin: Skin is warm and dry. No rash noted.  Psychiatric: She has a normal mood and affect. Her behavior is normal.          Assessment & Plan:   Viral URI with cough. We'll continue symptomatic treatment. We'll write a note February 28. Hypertension stable.

## 2011-12-15 NOTE — Patient Instructions (Signed)
Get plenty of rest, Drink lots of  clear liquids, and use Tylenol or ibuprofen for fever and discomfort.    Limit your sodium (Salt) intake  Return to work on February 28 is improved

## 2011-12-17 ENCOUNTER — Telehealth: Payer: Self-pay | Admitting: *Deleted

## 2011-12-17 NOTE — Telephone Encounter (Signed)
Pt needs [facility] note filled out and will drop off tomorrow.  Also, needed RX for congestion, and we advised Mucinex.

## 2011-12-18 NOTE — Telephone Encounter (Signed)
ok 

## 2012-09-20 ENCOUNTER — Ambulatory Visit (INDEPENDENT_AMBULATORY_CARE_PROVIDER_SITE_OTHER): Payer: 59 | Admitting: Internal Medicine

## 2012-09-20 ENCOUNTER — Encounter: Payer: Self-pay | Admitting: Internal Medicine

## 2012-09-20 VITALS — BP 136/80 | HR 98 | Temp 98.0°F | Resp 18 | Wt 242.0 lb

## 2012-09-20 DIAGNOSIS — I10 Essential (primary) hypertension: Secondary | ICD-10-CM

## 2012-09-20 DIAGNOSIS — M549 Dorsalgia, unspecified: Secondary | ICD-10-CM

## 2012-09-20 DIAGNOSIS — E669 Obesity, unspecified: Secondary | ICD-10-CM

## 2012-09-20 NOTE — Patient Instructions (Signed)
You  may move around, but avoid painful motions and activities.  Apply heat  to the sore area for 15 to 20 minutes 3 or 4 times daily for the next two to 3 days.  Aleve  2 twice daily    BUPAP  One every 4 hours as needed
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
## 2012-11-16 ENCOUNTER — Ambulatory Visit (INDEPENDENT_AMBULATORY_CARE_PROVIDER_SITE_OTHER): Payer: 59 | Admitting: Family Medicine

## 2012-11-16 ENCOUNTER — Encounter: Payer: Self-pay | Admitting: Family Medicine

## 2012-11-16 VITALS — BP 118/80 | HR 80 | Temp 99.0°F | Wt 229.0 lb

## 2012-11-16 DIAGNOSIS — M545 Low back pain: Secondary | ICD-10-CM

## 2012-11-16 NOTE — Patient Instructions (Signed)
-  heat for 15 minutes twice daily  -ibuprofen 400-600mg  or tylenol up to 1000mg  2-3 times daily as needed for pain  -topical sports creams as needed for pain  -home exercises daily: 1) started with circled exercises for the first 2 weeks 2) in two weeks add the other exercises   -follow up with your doctor in 1 month or sooner if concerns
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
## 2012-11-25 ENCOUNTER — Telehealth: Payer: Self-pay | Admitting: Internal Medicine

## 2012-11-25 NOTE — Telephone Encounter (Signed)
Patient Information:  Caller Name: Xiomara  Phone: [phone]  Patient: Madora, Barletta  Gender: Female  DOB: 02-10-1966  Age: 47 Years  PCP: Eleonore Chiquito [facility])  Pregnant: No  Office Follow Up:  Does the office need to follow up with this patient?: Yes  Instructions For The Office: Requesting new med for Migraine headaches  RN Note:  Requesting new med for Migraines headache  Symptoms  Reason For Call & Symptoms: Has bee getting more Migraines lately and taking Relpax and not helping. Rx for preventative is expired and will need it refilled. Today headache started at 0900-11/25/12 and she is still having pain- more on R side near temple. Helps to put pressure on that side of head. Took Relpax and Ibuprofen and still has headache and feels Nauseous.  Reviewed Health History In EMR: Yes  Reviewed Medications In EMR: Yes  Reviewed Allergies In EMR: Yes  Reviewed Surgeries / Procedures: Yes  Date of Onset of Symptoms: 11/25/2012  Treatments Tried: Relpax and ibuprofen causing nausea  Treatments Tried Worked: No OB / GYN:  LMP: 11/17/2012  Guideline(s) Used:  Headache  Headache  Disposition Per Guideline:   Callback by PCP or Subspecialist within 1 Hour  Reason For Disposition Reached:   Severe headache and has had severe headaches before  Advice Given:  Migraine Medication:   If your doctor has prescribed specific medication for your migraine, take it as directed as soon as the migraine starts.  Rest:   Lie down in a dark, quiet place and try to relax. Close your eyes and imagine your entire body relaxing.  Apply Cold to the Area:   Apply a cold wet washcloth or cold pack to the forehead for 20 minutes.

## 2012-11-26 MED ORDER — ELETRIPTAN HYDROBROMIDE 40 MG PO TABS: 40.0000 mg | ORAL_TABLET | ORAL | Status: DC | PRN

## 2012-11-26 NOTE — Telephone Encounter (Signed)
Spoke to pt told her Dr. Amador Cunas is out of the office till 2/11 I can not change medication, need an appt to discuss. Pt verbalized understanding and stated she has an appt next week on 2/14. Told her okay I can call in a refill for Relpax so you have something till you see him. Pt said okay that would be fine. Rx refill for Relpax faxed to pharmacy.

## 2012-12-03 ENCOUNTER — Ambulatory Visit: Payer: 59 | Admitting: Internal Medicine

## 2013-02-18 ENCOUNTER — Telehealth: Payer: Self-pay | Admitting: Internal Medicine

## 2013-02-18 NOTE — Telephone Encounter (Signed)
Patient Information:  Caller Name: Cece  Phone: [phone]  Patient: Amber, Calhoun  Gender: Female  DOB: June 10, 1966  Age: 47 Years  PCP: Eleonore Chiquito [facility])  Pregnant: No  Office Follow Up:  Does the office need to follow up with this patient?: Yes  Instructions For The Office: Please review.  Wanting Rx for Metoprolol called in (self stopped months ago).   Wanting Rx for Topamax called in (Relpax with Motrin) for migraines.   Wanting help for weight loss.  RN Note:  Requesting Rx: Metoprolol for HBP, Topamax for migraines, Help with weight reduction. Would like call back from office so will know when Rx called in.  Can be reached at [phone].  Symptoms  Reason For Call & Symptoms: Needing BP medication - has been out of Metoprolol for months and now concermed so wanting to restart Rx.  Headaches have been more frequent, more intense for the last 3 weeks.  Has tried Relpax taken at same time as Motrin but only minimal relief.  Asking for Topamax prescription for her migraines.  Does not currently have migraine. Also trying to loose weight - has gone from 240 Lbs to 228 Lbs in many months  but feels she needs some help now since loss has slowed..  Asking if medication can be called in to help her loose weight.  Reviewed Health History In EMR: Yes  Reviewed Medications In EMR: Yes  Reviewed Allergies In EMR: Yes  Reviewed Surgeries / Procedures: Yes  Date of Onset of Symptoms: 01/28/2013  Treatments Tried: Motrin  Treatments Tried Worked: No OB / GYN:  LMP: 01/19/2013  Guideline(s) Used:  Headache  Disposition Per Guideline:   Home Care  Reason For Disposition Reached:   Similar to previously diagnosed migraine headaches  Advice Given:  Rest:   Lie down in a dark, quiet place and try to relax. Close your eyes and imagine your entire body relaxing.  Apply Cold to the Area:   Apply a cold wet washcloth or cold pack to the forehead for 20 minutes.  Call Back If:  Headache lasts longer than 24 hours  You become worse.  Patient Will Follow Care Advice:  YES

## 2013-02-21 NOTE — Telephone Encounter (Signed)
Left message for patient to call back to schedule.  °

## 2013-02-25 ENCOUNTER — Encounter: Payer: Self-pay | Admitting: Internal Medicine

## 2013-02-25 ENCOUNTER — Ambulatory Visit (INDEPENDENT_AMBULATORY_CARE_PROVIDER_SITE_OTHER): Payer: 59 | Admitting: Internal Medicine

## 2013-02-25 ENCOUNTER — Other Ambulatory Visit: Payer: Self-pay | Admitting: Internal Medicine

## 2013-02-25 VITALS — BP 120/70 | HR 72 | Temp 99.2°F | Resp 12 | Wt 232.0 lb

## 2013-02-25 DIAGNOSIS — E669 Obesity, unspecified: Secondary | ICD-10-CM

## 2013-02-25 DIAGNOSIS — I10 Essential (primary) hypertension: Secondary | ICD-10-CM

## 2013-02-25 DIAGNOSIS — R51 Headache: Secondary | ICD-10-CM

## 2013-02-25 MED ORDER — TOPIRAMATE 50 MG PO TABS: 50.0000 mg | ORAL_TABLET | Freq: Two times a day (BID) | ORAL | Status: DC

## 2013-02-25 MED ORDER — KETOROLAC TROMETHAMINE 60 MG/2ML IM SOLN
60.0000 mg | Freq: Once | INTRAMUSCULAR | Status: AC
  Administered 2013-02-25: 60 mg via INTRAMUSCULAR

## 2013-02-25 MED ORDER — SUMATRIPTAN SUCCINATE 100 MG PO TABS: 100.0000 mg | ORAL_TABLET | ORAL | Status: DC | PRN

## 2013-02-25 MED ORDER — TOPIRAMATE 25 MG PO TABS: ORAL_TABLET | ORAL | Status: DC

## 2013-02-25 NOTE — Progress Notes (Signed)
Subjective:    Patient ID: [name], female    DOB: [birth year], 47 y.o.   MRN: [id number]  HPI  47 year old patient who is seen today for followup. She has a 30 year history of migraine headaches. More recently she has been on Relpax which she feels has lost its effectiveness. For the past 2 months she has had frequent headaches lasting 2-3 days per week over the past 8 weeks. She has been on Topamax in the past with the very nice reduction in headaches. She is now out of this medication. She has a history of exogenous obesity and is requesting an anti obesity medication. Benefits of Topamax as a weight loss drug discussed. She's also seen today for followup of hypertension. This has been well-controlled on metoprolol.  Past Medical History  Diagnosis Date  . 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 Headache 06/14/2007  . HYPERTENSION 06/14/2007    History   Social History  . Marital Status: Married    Spouse Name: N/A    Number of Children: N/A  . Years of Education: N/A   Occupational History  . Not on file.   Social History Main Topics  . Smoking status: Never Smoker   . Smokeless tobacco: Never Used  . Alcohol Use: No  . Drug Use: No  . Sexually Active: Not on file   Other Topics Concern  . Not on file   Social History Narrative  . No narrative on file    Past Surgical History  Procedure Laterality Date  . Dilation and curettage of uterus      x2    Family History  Problem Relation Age of Onset  . Diabetes Mother   . Hypertension Mother     No Known Allergies  Current Outpatient Prescriptions on File Prior to Visit  Medication Sig Dispense Refill  . eletriptan (RELPAX) 40 MG tablet One tablet by mouth at onset of headache. May repeat in 2 hours if headache persists or recurs. may repeat in 2 hours if necessary  6 tablet  1  . metoprolol (LOPRESSOR) 50 MG tablet Take 1 tablet (50 mg total) by mouth daily.  90 tablet  3   No current facility-administered  medications on file prior to visit.    BP 120/70  Pulse 72  Temp(Src) 99.2 F (37.3 C) (Oral)  Resp 12  Wt 232 lb (105.235 kg)  BMI 40.15 kg/m2      Review of Systems  Constitutional: Positive for unexpected weight change.  HENT: Negative for hearing loss, congestion, sore throat, rhinorrhea, dental problem, sinus pressure and tinnitus.   Eyes: Negative for pain, discharge and visual disturbance.  Respiratory: Negative for cough and shortness of breath.   Cardiovascular: Negative for chest pain, palpitations and leg swelling.  Gastrointestinal: Negative for nausea, vomiting, abdominal pain, diarrhea, constipation, blood in stool and abdominal distention.  Genitourinary: Negative for dysuria, urgency, frequency, hematuria, flank pain, vaginal bleeding, vaginal discharge, difficulty urinating, vaginal pain and pelvic pain.  Musculoskeletal: Negative for joint swelling, arthralgias and gait problem.  Skin: Negative for rash.  Neurological: Positive for headaches. Negative for dizziness, syncope, speech difficulty, weakness and numbness.  Hematological: Negative for adenopathy.  Psychiatric/Behavioral: Negative for behavioral problems, dysphoric mood and agitation. The patient is not nervous/anxious.        Objective:   Physical Exam  Constitutional: She is oriented to person, place, and time. She appears well-developed and well-nourished.  HENT:  Head: Normocephalic.  Right Ear: External ear normal.  Left Ear: External ear normal.  Mouth/Throat: Oropharynx is clear and moist.  Eyes: Conjunctivae and EOM are normal. Pupils are equal, round, and reactive to light.  Neck: Normal range of motion. Neck supple. No thyromegaly present.  Cardiovascular: Normal rate, regular rhythm, normal heart sounds and intact distal pulses.   Pulmonary/Chest: Effort normal and breath sounds normal.  Abdominal: Soft. Bowel sounds are normal. She exhibits no mass. There is no tenderness.   Musculoskeletal: Normal range of motion.  Lymphadenopathy:    She has no cervical adenopathy.  Neurological: She is alert and oriented to person, place, and time.  Skin: Skin is warm and dry. No rash noted.  Psychiatric: She has a normal mood and affect. Her behavior is normal.          Assessment & Plan:   Hypertension. Well controlled will continue metoprolol Migraine headaches. Will resume Topamax. This was quite beneficial in the past. Will also give a prescription for Imitrex this has been helpful in the past as well Obesity. Weight loss exercise encouraged. She is aware that Topamax may assist with weight loss. We'll reassess in 2 months

## 2013-02-25 NOTE — Patient Instructions (Signed)
Limit your sodium (Salt) intake    It is important that you exercise regularly, at least 20 minutes 3 to 4 times per week.  If you develop chest pain or shortness of breath seek  medical attention.  You need to lose weight.  Consider a lower calorie diet and regular exercise. 

## 2013-03-16 ENCOUNTER — Encounter: Payer: Self-pay | Admitting: Family Medicine

## 2013-03-16 ENCOUNTER — Ambulatory Visit (INDEPENDENT_AMBULATORY_CARE_PROVIDER_SITE_OTHER): Payer: 59 | Admitting: Family Medicine

## 2013-03-16 VITALS — BP 120/80 | Temp 98.6°F | Wt 226.0 lb

## 2013-03-16 DIAGNOSIS — K5289 Other specified noninfective gastroenteritis and colitis: Secondary | ICD-10-CM

## 2013-03-16 DIAGNOSIS — K529 Noninfective gastroenteritis and colitis, unspecified: Secondary | ICD-10-CM

## 2013-03-16 MED ORDER — ONDANSETRON HCL 4 MG PO TABS: 4.0000 mg | ORAL_TABLET | Freq: Three times a day (TID) | ORAL | Status: DC | PRN

## 2013-03-16 NOTE — Patient Instructions (Addendum)
-  no dairy for 1 week  -zofran if needed per instructions for nausea  -imodium (loperamide) if needed for diarrhea  -follow up as needed

## 2013-03-16 NOTE — Progress Notes (Signed)
Chief Complaint  Patient presents with  . Nausea    vomiting, diarrhea, stomach pain; feeling better today     HPI:  Acute visit for nausea, vomiting and diarrhea: -started 3 days ago -symptoms: nausea, vomiting, diarrhea for 2 days - now feeling better -stills feels a little nauseous, and still having loose stools but improving -denies: fevers today, blood in stool, inability to drink fluids, abd pain, dysuria, vaginal symptoms ROS: See pertinent positives and negatives per HPI.  Past Medical History  Diagnosis Date  . 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 Headache(784.0) 06/14/2007  . HYPERTENSION 06/14/2007    Family History  Problem Relation Age of Onset  . Diabetes Mother   . Hypertension Mother     History   Social History  . Marital Status: Married    Spouse Name: N/A    Number of Children: N/A  . Years of Education: N/A   Social History Main Topics  . Smoking status: Never Smoker   . Smokeless tobacco: Never Used  . Alcohol Use: No  . Drug Use: No  . Sexually Active: None   Other Topics Concern  . None   Social History Narrative  . None    Current outpatient prescriptions:metoprolol (LOPRESSOR) 50 MG tablet, TAKE 1 TABLET BY MOUTH ONCE DAILY, Disp: 90 tablet, Rfl: 3;  SUMAtriptan (IMITREX) 100 MG tablet, Take 1 tablet (100 mg total) by mouth every 2 (two) hours as needed for migraine., Disp: 10 tablet, Rfl: 4;  topiramate (TOPAMAX) 50 MG tablet, Take 1 tablet (50 mg total) by mouth 2 (two) times daily., Disp: 120 tablet, Rfl: 2 ondansetron (ZOFRAN) 4 MG tablet, Take 1 tablet (4 mg total) by mouth every 8 (eight) hours as needed for nausea., Disp: 20 tablet, Rfl: 0;  topiramate (TOPAMAX) 25 MG tablet, 1 tablet at bedtime for one week; then one twice daily for 1 week; then 1 in the morning and 2 at bedtime for one week; then 50 mg twice daily, Disp: 50 tablet, Rfl: 0  EXAM:  Filed Vitals:   03/16/13 1055  BP: 120/80  Temp: 98.6 F (37 C)    Body mass index  is 39.11 kg/(m^2).  GENERAL: vitals reviewed and listed above, alert, oriented, appears well hydrated and in no acute distress  HEENT: atraumatic, conjunttiva clear, no obvious abnormalities on inspection of external nose and ears  NECK: no obvious masses on inspection  LUNGS: clear to auscultation bilaterally, no wheezes, rales or rhonchi, good air movement  CV: HRRR, no peripheral edema  ABD: BS+, soft, mild difuse TTP, no rebound or guarding  MS: moves all extremities without noticeable abnormality  PSYCH: pleasant and cooperative, no obvious depression or anxiety  ASSESSMENT AND PLAN:  Discussed the following assessment and plan:  Gastroenteritis - Plan: ondansetron (ZOFRAN) 4 MG tablet  -resolving -oral rehydration, supportive care, return precautions discussed -Patient advised to return or notify a doctor immediately if symptoms worsen or persist or new concerns arise.  There are no Patient Instructions on file for this visit.   Kriste Basque R.

## 2013-04-19 ENCOUNTER — Ambulatory Visit (INDEPENDENT_AMBULATORY_CARE_PROVIDER_SITE_OTHER): Payer: 59 | Admitting: Physician Assistant

## 2013-04-19 VITALS — BP 122/76 | HR 77 | Temp 97.5°F | Resp 18 | Ht 65.0 in | Wt 234.0 lb

## 2013-04-19 DIAGNOSIS — H6690 Otitis media, unspecified, unspecified ear: Secondary | ICD-10-CM

## 2013-04-19 DIAGNOSIS — H60392 Other infective otitis externa, left ear: Secondary | ICD-10-CM

## 2013-04-19 DIAGNOSIS — H60399 Other infective otitis externa, unspecified ear: Secondary | ICD-10-CM

## 2013-04-19 DIAGNOSIS — H6692 Otitis media, unspecified, left ear: Secondary | ICD-10-CM

## 2013-04-19 MED ORDER — TRAMADOL HCL 50 MG PO TABS: 50.0000 mg | ORAL_TABLET | Freq: Three times a day (TID) | ORAL | Status: DC | PRN

## 2013-04-19 MED ORDER — CIPROFLOXACIN-DEXAMETHASONE 0.3-0.1 % OT SUSP: 4.0000 [drp] | Freq: Two times a day (BID) | OTIC | Status: DC

## 2013-04-19 MED ORDER — AMOXICILLIN 500 MG PO TABS: 1000.0000 mg | ORAL_TABLET | Freq: Three times a day (TID) | ORAL | Status: DC

## 2013-04-19 NOTE — Patient Instructions (Addendum)
Begin using the Ciprodex drops every 12 hours.  Also begin taking the amoxicillin as directed.  Take with food to reduce stomach upset.  Be sure to finish the full course.  Ibuprofen 600-800mg  every 8 hours with food for pain relief.  If you need more pain relief on top of this, can use tramadol every 8 hours.  Please let me know if any symptoms are worsening or not improving.   Otitis Externa Otitis externa is a bacterial or fungal infection of the outer ear canal. This is the area from the eardrum to the outside of the ear. Otitis externa is sometimes called "swimmer's ear." CAUSES  Possible causes of infection include:  Swimming in dirty water.  Moisture remaining in the ear after swimming or bathing.  Mild injury (trauma) to the ear.  Objects stuck in the ear (foreign body).  Cuts or scrapes (abrasions) on the outside of the ear. SYMPTOMS  The first symptom of infection is often itching in the ear canal. Later signs and symptoms may include swelling and redness of the ear canal, ear pain, and yellowish-white fluid (pus) coming from the ear. The ear pain may be worse when pulling on the earlobe. DIAGNOSIS  Your caregiver will perform a physical exam. A sample of fluid may be taken from the ear and examined for bacteria or fungi. TREATMENT  Antibiotic ear drops are often given for 10 to 14 days. Treatment may also include pain medicine or corticosteroids to reduce itching and swelling. PREVENTION   Keep your ear dry. Use the corner of a towel to absorb water out of the ear canal after swimming or bathing.  Avoid scratching or putting objects inside your ear. This can damage the ear canal or remove the protective wax that lines the canal. This makes it easier for bacteria and fungi to grow.  Avoid swimming in lakes, polluted water, or poorly chlorinated pools.  You may use ear drops made of rubbing alcohol and vinegar after swimming. Combine equal parts of white vinegar and alcohol in  a bottle. Put 3 or 4 drops into each ear after swimming. HOME CARE INSTRUCTIONS   Apply antibiotic ear drops to the ear canal as prescribed by your caregiver.  Only take over-the-counter or prescription medicines for pain, discomfort, or fever as directed by your caregiver.  If you have diabetes, follow any additional treatment instructions from your caregiver.  Keep all follow-up appointments as directed by your caregiver. SEEK MEDICAL CARE IF:   You have a fever.  Your ear is still red, swollen, painful, or draining pus after 3 days.  Your redness, swelling, or pain gets worse.  You have a severe headache.  You have redness, swelling, pain, or tenderness in the area behind your ear. MAKE SURE YOU:   Understand these instructions.  Will watch your condition.  Will get help right away if you are not doing well or get worse. Document Released: 10/06/2005 Document Revised: 12/29/2011 Document Reviewed: 10/23/2011 [facility] Patient Information 2014 [place], [place].   Otitis Media, Adult A middle ear infection is an infection in the space behind the eardrum. The medical name for this is "otitis media." It may happen after a common cold. It is caused by a germ that starts growing in that space. You may feel swollen glands in your neck on the side of the ear infection. HOME CARE INSTRUCTIONS   Take your medicine as directed until it is gone, even if you feel better after the first few days.  Only take over-the-counter or prescription medicines for pain, discomfort, or fever as directed by your caregiver.  Occasional use of a nasal decongestant a couple times per day may help with discomfort and help the eustachian tube to drain better. Follow up with your caregiver in 10 to 14 days or as directed, to be certain that the infection has cleared. Not keeping the appointment could result in a chronic or permanent injury, pain, hearing loss and disability. If there is any problem keeping  the appointment, you must call back to this facility for assistance. SEEK IMMEDIATE MEDICAL CARE IF:   You are not getting better in 2 to 3 days.  You have pain that is not controlled with medication.  You feel worse instead of better.  You cannot use the medication as directed.  You develop swelling, redness or pain around the ear or stiffness in your neck. MAKE SURE YOU:   Understand these instructions.  Will watch your condition.  Will get help right away if you are not doing well or get worse. Document Released: 07/11/2004 Document Revised: 12/29/2011 Document Reviewed: 05/12/2008 [facility] Patient Information 2014 [place], [place].

## 2013-04-19 NOTE — Progress Notes (Signed)
  Subjective:    Patient ID: [name], female    DOB: 1966-07-07, 47 y.o.   MRN: [id number]  HPI   [name] is a very pleasant 47 yr old female with about 5 days of left ear pain.  Describes the hear as feeling "hot".  External ear quite tender.  Denies drainage from the ear.  Denies change in hearing.  Little HA but does have migraines.  Denies URI symptoms, fever, or chills.  No prior ear problems.  No possible FB.  Wonders if she could have an abscessed tooth though she does not have tooth or jaw pain.  No prior TMJ issues.  No popping, clicking, clenching, grinding.     Review of Systems  Constitutional: Negative for fever and chills.  HENT: Positive for ear pain. Negative for hearing loss, congestion, rhinorrhea and ear discharge.   Respiratory: Negative for cough, wheezing and stridor.   Cardiovascular: Negative.   Gastrointestinal: Negative.   Musculoskeletal: Negative.   Skin: Negative.   Neurological: Positive for headaches.       Objective:   Physical Exam  Vitals reviewed. Constitutional: She is oriented to person, place, and time. She appears well-developed and well-nourished. No distress.  HENT:  Head: Normocephalic and atraumatic.  Right Ear: Hearing, tympanic membrane, external ear and ear canal normal.  Left Ear: Hearing normal. There is swelling and tenderness. Tympanic membrane is erythematous.  Mouth/Throat: Uvula is midline, oropharynx is clear and moist and mucous membranes are normal. Normal dentition. No dental abscesses.  Left external ear extremely tender to palpation; TTP over tragus; difficult to complete otoscopic exam due to pain; left EAC erythematous and slightly swollen; TM flat and erythematous  Eyes: Conjunctivae are normal. No scleral icterus.  Neck: Neck supple.  Cardiovascular: Normal rate, regular rhythm and normal heart sounds.   Pulmonary/Chest: Effort normal and breath sounds normal. She has no wheezes. She has no rales.   Lymphadenopathy:    She has no cervical adenopathy.  Neurological: She is alert and oriented to person, place, and time.  Skin: Skin is warm and dry.  Psychiatric: She has a normal mood and affect. Her behavior is normal.        Assessment & Plan:  Otitis, externa, infective, left - Plan: ciprofloxacin-dexamethasone (CIPRODEX) otic suspension, traMADol (ULTRAM) 50 MG tablet  Otitis media, left - Plan: amoxicillin (AMOXIL) 500 MG tablet   [name] is a very pleasant 47 yr old female with significant left ear pain.  On exam external ear is quite tender.  No mastoid tenderness or erythema.  Dentition is in good repair with no evidence infection.  It was difficult to perform otoscopic exam due to pain.  The canal is erythematous and slightly swollen.  Suspect OE given the level of tenderness.  TM appears flat and erythematous, difficult to appreciate fully given poor exam.  Will treat for OE and OM with Ciprodex drops and PO amox.  Ibuprofen q8h for pain.  Norco q8h prn breakthrough pain.  If worsening or not improving, pt to RTC.

## 2013-04-28 ENCOUNTER — Ambulatory Visit: Payer: 59 | Admitting: Internal Medicine

## 2013-06-28 ENCOUNTER — Encounter: Payer: Self-pay | Admitting: Family Medicine

## 2013-06-28 ENCOUNTER — Ambulatory Visit (INDEPENDENT_AMBULATORY_CARE_PROVIDER_SITE_OTHER): Payer: 59 | Admitting: Family Medicine

## 2013-06-28 VITALS — BP 128/80 | Temp 98.3°F | Wt 240.0 lb

## 2013-06-28 DIAGNOSIS — R300 Dysuria: Secondary | ICD-10-CM

## 2013-06-28 DIAGNOSIS — E669 Obesity, unspecified: Secondary | ICD-10-CM

## 2013-06-28 DIAGNOSIS — Z23 Encounter for immunization: Secondary | ICD-10-CM

## 2013-06-28 LAB — POCT URINALYSIS DIPSTICK
Bilirubin, UA: NEGATIVE
Blood, UA: NEGATIVE
Nitrite, UA: NEGATIVE
Protein, UA: NEGATIVE
pH, UA: 5

## 2013-06-28 MED ORDER — CIPROFLOXACIN HCL 250 MG PO TABS: 250.0000 mg | ORAL_TABLET | Freq: Two times a day (BID) | ORAL | Status: DC

## 2013-06-28 NOTE — Addendum Note (Signed)
Addended by: Azucena Freed on: 06/28/2013 10:22 AM   Modules accepted: Orders

## 2013-06-28 NOTE — Progress Notes (Signed)
No chief complaint on file.   HPI:  Acute visit for  1) Dysuria: -started 3-4 days ago -symptoms: frequency, urgency, burning with urination -denies: fevers, chills, flank pain, NVD, abnormal vaginal discharge, due for period in a few days -hx of UTIs in the past -no treatments  2) weight loss: -options for weight loss  ROS: See pertinent positives and negatives per HPI.  Past Medical History  Diagnosis Date  . 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 Headache(784.0) 06/14/2007  . HYPERTENSION 06/14/2007    Past Surgical History  Procedure Laterality Date  . Dilation and curettage of uterus      x2    Family History  Problem Relation Age of Onset  . Diabetes Mother   . Hypertension Mother     History   Social History  . Marital Status: Married    Spouse Name: N/A    Number of Children: N/A  . Years of Education: N/A   Social History Main Topics  . Smoking status: Never Smoker   . Smokeless tobacco: Never Used  . Alcohol Use: No  . Drug Use: No  . Sexual Activity: None   Other Topics Concern  . None   Social History Narrative  . None    Current outpatient prescriptions:metoprolol (LOPRESSOR) 50 MG tablet, TAKE 1 TABLET BY MOUTH ONCE DAILY, Disp: 90 tablet, Rfl: 3;  ondansetron (ZOFRAN) 4 MG tablet, Take 1 tablet (4 mg total) by mouth every 8 (eight) hours as needed for nausea., Disp: 20 tablet, Rfl: 0;  SUMAtriptan (IMITREX) 100 MG tablet, Take 1 tablet (100 mg total) by mouth every 2 (two) hours as needed for migraine., Disp: 10 tablet, Rfl: 4 topiramate (TOPAMAX) 25 MG tablet, 1 tablet at bedtime for one week; then one twice daily for 1 week; then 1 in the morning and 2 at bedtime for one week; then 50 mg twice daily, Disp: 50 tablet, Rfl: 0;  topiramate (TOPAMAX) 50 MG tablet, Take 1 tablet (50 mg total) by mouth 2 (two) times daily., Disp: 120 tablet, Rfl: 2 traMADol (ULTRAM) 50 MG tablet, Take 1 tablet (50 mg total) by mouth every 8 (eight) hours as needed for  pain., Disp: 20 tablet, Rfl: 0;  ciprofloxacin (CIPRO) 250 MG tablet, Take 1 tablet (250 mg total) by mouth 2 (two) times daily., Disp: 6 tablet, Rfl: 0;  ciprofloxacin-dexamethasone (CIPRODEX) otic suspension, Place 4 drops into the left ear 2 (two) times daily., Disp: 7.5 mL, Rfl: 0  EXAM:  Filed Vitals:   06/28/13 1001  BP: 128/80  Temp: 98.3 F (36.8 C)    Body mass index is 39.94 kg/(m^2).  GENERAL: vitals reviewed and listed above, alert, oriented, appears well hydrated and in no acute distress  HEENT: atraumatic, conjunttiva clear, no obvious abnormalities on inspection of external nose and ears  NECK: no obvious masses on inspection  LUNGS: clear to auscultation bilaterally, no wheezes, rales or rhonchi, good air movement  CV: HRRR, no peripheral edema  MS: moves all extremities without noticeable abnormality  PSYCH: pleasant and cooperative, no obvious depression or anxiety  ASSESSMENT AND PLAN:  Discussed the following assessment and plan:  Dysuria Plan: POCT urinalysis dipstick, Culture, Urine, ciprofloxacin (CIPRO) 250 MG tablet -pyuria, tx empirically per pt wishes, culture pedning  Need for prophylactic vaccination and inoculation against influenza -  Given  EXOGENOUS OBESITY -advised diet and exercise -advised of [facility] bariatric program -advised to discuss other options with PCP  -Patient advised to return or notify a doctor  immediately if symptoms worsen or persist or new concerns arise.  Patient Instructions  --As we discussed, we have prescribed a new medication for you at this appointment. We discussed the common and serious potential adverse effects of this medication and you can review these and more with the pharmacist when you pick up your medication.  Please follow the instructions for use carefully and notify us immediately if you have any problems taking this medication.  -plenty of water, cranberry and blueberry juice are good  too  -can use azo for symptoms as directed  -follow up as needed      [name] R.

## 2013-06-28 NOTE — Patient Instructions (Signed)
--  As we discussed, we have prescribed a new medication for you at this appointment. We discussed the common and serious potential adverse effects of this medication and you can review these and more with the pharmacist when you pick up your medication.  Please follow the instructions for use carefully and notify us immediately if you have any problems taking this medication.  -plenty of water, cranberry and blueberry juice are good too  -can use azo for symptoms as directed  -follow up as needed

## 2013-07-01 LAB — URINE CULTURE

## 2013-07-05 NOTE — Progress Notes (Signed)
Quick Note:  Called and spoke with pt and pt is aware. Pt states she is feeling better. ______ 

## 2013-11-10 ENCOUNTER — Ambulatory Visit (INDEPENDENT_AMBULATORY_CARE_PROVIDER_SITE_OTHER): Payer: 59 | Admitting: Internal Medicine

## 2013-11-10 ENCOUNTER — Encounter: Payer: Self-pay | Admitting: Internal Medicine

## 2013-11-10 VITALS — BP 136/80 | HR 95 | Temp 98.5°F | Resp 20 | Ht 65.0 in | Wt 254.0 lb

## 2013-11-10 DIAGNOSIS — R209 Unspecified disturbances of skin sensation: Secondary | ICD-10-CM

## 2013-11-10 DIAGNOSIS — I10 Essential (primary) hypertension: Secondary | ICD-10-CM

## 2013-11-10 DIAGNOSIS — E669 Obesity, unspecified: Secondary | ICD-10-CM

## 2013-11-10 DIAGNOSIS — R202 Paresthesia of skin: Secondary | ICD-10-CM

## 2013-11-10 LAB — CBC WITH DIFFERENTIAL/PLATELET
BASOS ABS: 0 10*3/uL (ref 0.0–0.1)
Basophils Relative: 0.3 % (ref 0.0–3.0)
EOS ABS: 0.3 10*3/uL (ref 0.0–0.7)
Eosinophils Relative: 3.1 % (ref 0.0–5.0)
HCT: 40 % (ref 36.0–46.0)
Hemoglobin: 13.5 g/dL (ref 12.0–15.0)
LYMPHS PCT: 25.3 % (ref 12.0–46.0)
Lymphs Abs: 2.1 10*3/uL (ref 0.7–4.0)
MCHC: 33.8 g/dL (ref 30.0–36.0)
MCV: 81.8 fl (ref 78.0–100.0)
MONO ABS: 0.7 10*3/uL (ref 0.1–1.0)
Monocytes Relative: 8 % (ref 3.0–12.0)
NEUTROS PCT: 63.3 % (ref 43.0–77.0)
Neutro Abs: 5.2 10*3/uL (ref 1.4–7.7)
PLATELETS: 258 10*3/uL (ref 150.0–400.0)
RBC: 4.88 Mil/uL (ref 3.87–5.11)
RDW: 13.2 % (ref 11.5–14.6)
WBC: 8.3 10*3/uL (ref 4.5–10.5)

## 2013-11-10 LAB — TSH: TSH: 1.51 u[IU]/mL (ref 0.35–5.50)

## 2013-11-10 LAB — COMPREHENSIVE METABOLIC PANEL
ALBUMIN: 3.5 g/dL (ref 3.5–5.2)
ALT: 14 U/L (ref 0–35)
AST: 21 U/L (ref 0–37)
Alkaline Phosphatase: 92 U/L (ref 39–117)
BUN: 12 mg/dL (ref 6–23)
CALCIUM: 9.1 mg/dL (ref 8.4–10.5)
CHLORIDE: 103 meq/L (ref 96–112)
CO2: 30 mEq/L (ref 19–32)
Creatinine, Ser: 0.9 mg/dL (ref 0.4–1.2)
GFR: 91.15 mL/min (ref >60.00)
Glucose, Bld: 105 mg/dL — ABNORMAL HIGH (ref 70–99)
POTASSIUM: 3.7 meq/L (ref 3.5–5.1)
SODIUM: 139 meq/L (ref 135–145)
TOTAL PROTEIN: 7.2 g/dL (ref 6.0–8.3)
Total Bilirubin: 0.7 mg/dL (ref 0.3–1.2)

## 2013-11-10 LAB — LIPID PANEL
CHOLESTEROL: 149 mg/dL (ref 0–200)
HDL: 36.6 mg/dL — ABNORMAL LOW (ref >39.00)
LDL CALC: 77 mg/dL (ref 0–99)
Total CHOL/HDL Ratio: 4
Triglycerides: 175 mg/dL — ABNORMAL HIGH (ref 0.0–149.0)
VLDL: 35 mg/dL (ref 0.0–40.0)

## 2013-11-10 LAB — VITAMIN B12: VITAMIN B 12: 606 pg/mL (ref 211–911)

## 2013-11-10 NOTE — Progress Notes (Signed)
Pre-visit discussion using our clinic review tool. No additional management support is needed unless otherwise documented below in the visit note.  

## 2013-11-10 NOTE — Progress Notes (Signed)
   Subjective:    Patient ID: [name], female    DOB: [birth year], 48 y.o.   MRN: [id number]  HPI 48 year old patient who has a history of obesity untreated hypertension. She complains of increasing tingling involving primarily the right lower arm it extends from the elbow to the hand she also has some left hand symptoms. She states she is left handed but uses her right hand predominantly. For the past 4 weeks she has had some mild tingling involving both feet. Symptoms are paroxysmal and often occur at night. She is concerned about the excessive weight gain     Wt Readings from Last 3 Encounters:  11/10/13 254 lb (115.214 kg)  06/28/13 240 lb (108.863 kg)  04/19/13 234 lb (106.142 kg)    Review of Systems  Constitutional: Positive for unexpected weight change.  HENT: Negative for congestion, dental problem, hearing loss, rhinorrhea, sinus pressure, sore throat and tinnitus.   Eyes: Negative for pain, discharge and visual disturbance.  Respiratory: Negative for cough and shortness of breath.   Cardiovascular: Negative for chest pain, palpitations and leg swelling.  Gastrointestinal: Negative for nausea, vomiting, abdominal pain, diarrhea, constipation, blood in stool and abdominal distention.  Genitourinary: Negative for dysuria, urgency, frequency, hematuria, flank pain, vaginal bleeding, vaginal discharge, difficulty urinating, vaginal pain and pelvic pain.  Musculoskeletal: Negative for arthralgias, gait problem and joint swelling.  Skin: Negative for rash.  Neurological: Positive for numbness. Negative for dizziness, syncope, speech difficulty, weakness and headaches.  Hematological: Negative for adenopathy.  Psychiatric/Behavioral: Negative for behavioral problems, dysphoric mood and agitation. The patient is not nervous/anxious.        Objective:   Physical Exam  Constitutional: She appears well-developed. No distress.  Obese. Weight 254  Neurological:  Negative Tinel's  bilaterally Normal grip strength Biceps and triceps reflexes somewhat suppressed but symmetrical  Normal ankle extension and flexion The right Achilles reflex was brisk. The left may have been slightly diminished           Assessment & Plan:  Probable mild carpal tunnel syndrome. We'll encourage weight loss and observe. We'll check lab including TSH blood sugar and B12 Obesity. Will set up for dietary consult

## 2013-11-10 NOTE — Patient Instructions (Signed)
Limit your sodium (Salt) intake    It is important that you exercise regularly, at least 20 minutes 3 to 4 times per week.  If you develop chest pain or shortness of breath seek  medical attention.  You need to lose weight.  Consider a lower calorie diet and regular exercise.Carpal Tunnel Syndrome The carpal tunnel is a narrow area located on the palm side of your wrist. The tunnel is formed by the wrist bones and ligaments. Nerves, blood vessels, and tendons pass through the carpal tunnel. Repeated wrist motion or certain diseases may cause swelling within the tunnel. This swelling pinches the main nerve in the wrist (median nerve) and causes the painful hand and arm condition called carpal tunnel syndrome. CAUSES   Repeated wrist motions.  Wrist injuries.  Certain diseases like arthritis, diabetes, alcoholism, hyperthyroidism, and kidney failure.  Obesity.  Pregnancy. SYMPTOMS   A "pins and needles" feeling in your fingers or hand.  Tingling or numbness in your fingers or hand.  An aching feeling in your entire arm.  Wrist pain that goes up your arm to your shoulder.  Pain that goes down into your palm or fingers.  A weak feeling in your hands. DIAGNOSIS  Your caregiver will take your history and perform a physical exam. An electromyography test may be needed. This test measures electrical signals sent out by the muscles. The electrical signals are usually slowed by carpal tunnel syndrome. You may also need X-rays. TREATMENT  Carpal tunnel syndrome may clear up by itself. Your caregiver may recommend a wrist splint or medicine such as a nonsteroidal anti-inflammatory medicine. Cortisone injections may help. Sometimes, surgery may be needed to free the pinched nerve.  HOME CARE INSTRUCTIONS   Take all medicine as directed by your caregiver. Only take over-the-counter or prescription medicines for pain, discomfort, or fever as directed by your caregiver.  If you were given a  splint to keep your wrist from bending, wear it as directed. It is important to wear the splint at night. Wear the splint for as long as you have pain or numbness in your hand, arm, or wrist. This may take 1 to 2 months.  Rest your wrist from any activity that may be causing your pain. If your symptoms are work-related, you may need to talk to your employer about changing to a job that does not require using your wrist.  Put ice on your wrist after long periods of wrist activity.  Put ice in a plastic bag.  Place a towel between your skin and the bag.  Leave the ice on for 15-20 minutes, 03-04 times a day.  Keep all follow-up visits as directed by your caregiver. This includes any orthopedic referrals, physical therapy, and rehabilitation. Any delay in getting necessary care could result in a delay or failure of your condition to heal. SEEK IMMEDIATE MEDICAL CARE IF:   You have new, unexplained symptoms.  Your symptoms get worse and are not helped or controlled with medicines. MAKE SURE YOU:   Understand these instructions.  Will watch your condition.  Will get help right away if you are not doing well or get worse. Document Released: 10/03/2000 Document Revised: 12/29/2011 Document Reviewed: 08/22/2011 [facility] Patient Information 2014 [place], [place].

## 2013-11-23 ENCOUNTER — Telehealth: Payer: Self-pay | Admitting: Internal Medicine

## 2013-11-23 NOTE — Telephone Encounter (Signed)
Relevant patient education assigned to patient using Emmi. ° °

## 2013-11-28 ENCOUNTER — Other Ambulatory Visit (INDEPENDENT_AMBULATORY_CARE_PROVIDER_SITE_OTHER): Payer: 59

## 2013-11-28 DIAGNOSIS — Z0000 Encounter for general adult medical examination without abnormal findings: Secondary | ICD-10-CM

## 2013-11-28 LAB — CBC WITH DIFFERENTIAL/PLATELET
BASOS ABS: 0 10*3/uL (ref 0.0–0.1)
Basophils Relative: 0.5 % (ref 0.0–3.0)
EOS PCT: 1.7 % (ref 0.0–5.0)
Eosinophils Absolute: 0.1 10*3/uL (ref 0.0–0.7)
HEMATOCRIT: 42.1 % (ref 36.0–46.0)
HEMOGLOBIN: 14 g/dL (ref 12.0–15.0)
LYMPHS ABS: 1.7 10*3/uL (ref 0.7–4.0)
LYMPHS PCT: 22.4 % (ref 12.0–46.0)
MCHC: 33.3 g/dL (ref 30.0–36.0)
MCV: 83.6 fl (ref 78.0–100.0)
MONOS PCT: 8.1 % (ref 3.0–12.0)
Monocytes Absolute: 0.6 10*3/uL (ref 0.1–1.0)
NEUTROS ABS: 5 10*3/uL (ref 1.4–7.7)
Neutrophils Relative %: 67.3 % (ref 43.0–77.0)
PLATELETS: 264 10*3/uL (ref 150.0–400.0)
RBC: 5.03 Mil/uL (ref 3.87–5.11)
RDW: 13.4 % (ref 11.5–14.6)
WBC: 7.5 10*3/uL (ref 4.5–10.5)

## 2013-11-28 LAB — LIPID PANEL
Cholesterol: 128 mg/dL (ref 0–200)
HDL: 38.9 mg/dL — AB (ref >39.00)
LDL Cholesterol: 70 mg/dL (ref 0–99)
TRIGLYCERIDES: 96 mg/dL (ref 0.0–149.0)
Total CHOL/HDL Ratio: 3
VLDL: 19.2 mg/dL (ref 0.0–40.0)

## 2013-11-28 LAB — BASIC METABOLIC PANEL
BUN: 11 mg/dL (ref 6–23)
CALCIUM: 8.8 mg/dL (ref 8.4–10.5)
CO2: 22 mEq/L (ref 19–32)
Chloride: 111 mEq/L (ref 96–112)
Creatinine, Ser: 0.8 mg/dL (ref 0.4–1.2)
GFR: 103.53 mL/min (ref >60.00)
Glucose, Bld: 94 mg/dL (ref 70–99)
POTASSIUM: 4 meq/L (ref 3.5–5.1)
SODIUM: 142 meq/L (ref 135–145)

## 2013-11-28 LAB — POCT URINALYSIS DIPSTICK
BILIRUBIN UA: NEGATIVE
GLUCOSE UA: NEGATIVE
Ketones, UA: NEGATIVE
Leukocytes, UA: NEGATIVE
NITRITE UA: NEGATIVE
PH UA: 6
Protein, UA: NEGATIVE
RBC UA: NEGATIVE
SPEC GRAV UA: 1.02
Urobilinogen, UA: 0.2

## 2013-11-28 LAB — HEPATIC FUNCTION PANEL
ALK PHOS: 79 U/L (ref 39–117)
ALT: 13 U/L (ref 0–35)
AST: 18 U/L (ref 0–37)
Albumin: 3.7 g/dL (ref 3.5–5.2)
BILIRUBIN TOTAL: 0.5 mg/dL (ref 0.3–1.2)
Bilirubin, Direct: 0 mg/dL (ref 0.0–0.3)
Total Protein: 7.2 g/dL (ref 6.0–8.3)

## 2013-11-28 LAB — TSH: TSH: 0.92 u[IU]/mL (ref 0.35–5.50)

## 2013-11-30 ENCOUNTER — Encounter: Payer: Self-pay | Admitting: Internal Medicine

## 2013-11-30 ENCOUNTER — Ambulatory Visit (INDEPENDENT_AMBULATORY_CARE_PROVIDER_SITE_OTHER): Payer: 59 | Admitting: Internal Medicine

## 2013-11-30 VITALS — BP 146/80 | HR 97 | Temp 98.2°F | Resp 20 | Ht 65.0 in | Wt 250.0 lb

## 2013-11-30 DIAGNOSIS — I10 Essential (primary) hypertension: Secondary | ICD-10-CM

## 2013-11-30 DIAGNOSIS — R51 Headache: Secondary | ICD-10-CM

## 2013-11-30 MED ORDER — MEPERIDINE HCL 50 MG/ML IJ SOLN
50.0000 mg | Freq: Once | INTRAMUSCULAR | Status: AC
  Administered 2013-11-30: 50 mg via INTRAMUSCULAR

## 2013-11-30 MED ORDER — PROMETHAZINE HCL 50 MG/ML IJ SOLN
50.0000 mg | Freq: Once | INTRAMUSCULAR | Status: AC
  Administered 2013-11-30: 50 mg via INTRAMUSCULAR

## 2013-11-30 NOTE — Patient Instructions (Signed)
Call or return to clinic prn if these symptoms worsen or fail to improve as anticipated.  Migraine Headache A migraine headache is an intense, throbbing pain on one or both sides of your head. A migraine can last for 30 minutes to several hours. CAUSES  The exact cause of a migraine headache is not always known. However, a migraine may be caused when nerves in the brain become irritated and release chemicals that cause inflammation. This causes pain. Certain things may also trigger migraines, such as:  Alcohol.  Smoking.  Stress.  Menstruation.  Aged cheeses.  Foods or drinks that contain nitrates, glutamate, aspartame, or tyramine.  Lack of sleep.  Chocolate.  Caffeine.  Hunger.  Physical exertion.  Fatigue.  Medicines used to treat chest pain (nitroglycerine), birth control pills, estrogen, and some blood pressure medicines. SIGNS AND SYMPTOMS  Pain on one or both sides of your head.  Pulsating or throbbing pain.  Severe pain that prevents daily activities.  Pain that is aggravated by any physical activity.  Nausea, vomiting, or both.  Dizziness.  Pain with exposure to bright lights, loud noises, or activity.  General sensitivity to bright lights, loud noises, or smells. Before you get a migraine, you may get warning signs that a migraine is coming (aura). An aura may include:  Seeing flashing lights.  Seeing bright spots, halos, or zig-zag lines.  Having tunnel vision or blurred vision.  Having feelings of numbness or tingling.  Having trouble talking.  Having muscle weakness. DIAGNOSIS  A migraine headache is often diagnosed based on:  Symptoms.  Physical exam.  A CT scan or MRI of your head. These imaging tests cannot diagnose migraines, but they can help rule out other causes of headaches. TREATMENT Medicines may be given for pain and nausea. Medicines can also be given to help prevent recurrent migraines.  HOME CARE INSTRUCTIONS  Only  take over-the-counter or prescription medicines for pain or discomfort as directed by your health care provider. The use of long-term narcotics is not recommended.  Lie down in a dark, quiet room when you have a migraine.  Keep a journal to find out what may trigger your migraine headaches. For example, write down:  What you eat and drink.  How much sleep you get.  Any change to your diet or medicines.  Limit alcohol consumption.  Quit smoking if you smoke.  Get 7 9 hours of sleep, or as recommended by your health care provider.  Limit stress.  Keep lights dim if bright lights bother you and make your migraines worse. SEEK IMMEDIATE MEDICAL CARE IF:   Your migraine becomes severe.  You have a fever.  You have a stiff neck.  You have vision loss.  You have muscular weakness or loss of muscle control.  You start losing your balance or have trouble walking.  You feel faint or pass out.  You have severe symptoms that are different from your first symptoms. MAKE SURE YOU:   Understand these instructions.  Will watch your condition.  Will get help right away if you are not doing well or get worse. Document Released: 10/06/2005 Document Revised: 07/27/2013 Document Reviewed: 06/13/2013 [facility] Patient Information 2014 [place].

## 2013-11-30 NOTE — Progress Notes (Signed)
Pre-visit discussion using our clinic review tool. No additional management support is needed unless otherwise documented below in the visit note.  

## 2013-11-30 NOTE — Addendum Note (Signed)
Addended by: Marian Sorrow on: 11/30/2013 11:16 AM   Modules accepted: Orders

## 2013-11-30 NOTE — Progress Notes (Signed)
Subjective:    Patient ID: [name], female    DOB: Jan 05, 1966, 48 y.o.   MRN: [id number]  HPI 48 years old patient who has a history of migraine headaches. She remains on metoprolol as well as Topamax. Yesterday with when she awoke she had the onset of right frontal headaches associated with nausea and photophobia. She took a single Imitrex without much benefit and some ibuprofen and Aleve. Headache improved but did not resolve. She did go to work last night for her 12:00 shift and was able to work through the night. Today her headaches have intensified. She has taken no more Imitrex.  She continues to be bothered by nausea and light sensitivity.  Past Medical History  Diagnosis Date  . 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 Headache(784.0) 06/14/2007  . HYPERTENSION 06/14/2007    History   Social History  . Marital Status: Married    Spouse Name: N/A    Number of Children: N/A  . Years of Education: N/A   Occupational History  . Not on file.   Social History Main Topics  . Smoking status: Never Smoker   . Smokeless tobacco: Never Used  . Alcohol Use: No  . Drug Use: No  . Sexual Activity: Not on file   Other Topics Concern  . Not on file   Social History Narrative  . No narrative on file    Past Surgical History  Procedure Laterality Date  . Dilation and curettage of uterus      x2    Family History  Problem Relation Age of Onset  . Diabetes Mother   . Hypertension Mother     No Known Allergies  Current Outpatient Prescriptions on File Prior to Visit  Medication Sig Dispense Refill  . metoprolol (LOPRESSOR) 50 MG tablet TAKE 1 TABLET BY MOUTH ONCE DAILY  90 tablet  3  . SUMAtriptan (IMITREX) 100 MG tablet Take 1 tablet (100 mg total) by mouth every 2 (two) hours as needed for migraine.  10 tablet  4  . topiramate (TOPAMAX) 50 MG tablet Take 1 tablet (50 mg total) by mouth 2 (two) times daily.  120 tablet  2   No current facility-administered medications on  file prior to visit.    BP 146/80  Pulse 97  Temp(Src) 98.2 F (36.8 C) (Oral)  Resp 20  Ht 5\' 5"  (1.651 m)  Wt 250 lb (113.399 kg)  BMI 41.60 kg/m2  SpO2 98%  LMP 11/17/2013       Review of Systems  Constitutional: Negative.   HENT: Negative for congestion, dental problem, hearing loss, rhinorrhea, sinus pressure, sore throat and tinnitus.   Eyes: Positive for photophobia. Negative for pain, discharge and visual disturbance.  Respiratory: Negative for cough and shortness of breath.   Cardiovascular: Negative for chest pain, palpitations and leg swelling.  Gastrointestinal: Positive for nausea. Negative for vomiting, abdominal pain, diarrhea, constipation, blood in stool and abdominal distention.  Genitourinary: Negative for dysuria, urgency, frequency, hematuria, flank pain, vaginal bleeding, vaginal discharge, difficulty urinating, vaginal pain and pelvic pain.  Musculoskeletal: Negative for arthralgias, gait problem and joint swelling.  Skin: Negative for rash.  Neurological: Positive for headaches. Negative for dizziness, syncope, speech difficulty, weakness and numbness.  Hematological: Negative for adenopathy.  Psychiatric/Behavioral: Negative for behavioral problems, dysphoric mood and agitation. The patient is not nervous/anxious.        Objective:   Physical Exam  Constitutional: She is oriented to person, place, and time. She appears  well-developed and well-nourished. No distress.  Uncomfortable sitting in a darkened room but in no acute distress. Alert and appropriate  HENT:  Head: Normocephalic.  Right Ear: External ear normal.  Left Ear: External ear normal.  Mouth/Throat: Oropharynx is clear and moist.  Eyes: Conjunctivae and EOM are normal. Pupils are equal, round, and reactive to light.  Neck: Normal range of motion. Neck supple. No thyromegaly present.  Cardiovascular: Normal rate, regular rhythm, normal heart sounds and intact distal pulses.     Pulmonary/Chest: Effort normal and breath sounds normal.  Abdominal: She exhibits no mass. There is no tenderness.  Lymphadenopathy:    She has no cervical adenopathy.  Neurological: She is alert and oriented to person, place, and time. No cranial nerve deficit. Coordination normal.  Skin: Skin is warm and dry. No rash noted.  Psychiatric: She has a normal mood and affect. Her behavior is normal.          Assessment & Plan:   Migraine headache. The patient is given a single Relpax and also samples. She was treated with the Inderal and Phenergan. 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 We'll continue present prophylactic regimen. Hypertension stable

## 2013-12-01 ENCOUNTER — Telehealth: Payer: Self-pay | Admitting: Internal Medicine

## 2013-12-01 NOTE — Telephone Encounter (Signed)
Relevant patient education assigned to patient using Emmi. ° °

## 2013-12-08 ENCOUNTER — Encounter: Payer: 59 | Admitting: Internal Medicine

## 2013-12-09 ENCOUNTER — Encounter: Payer: Self-pay | Admitting: Internal Medicine

## 2013-12-09 ENCOUNTER — Ambulatory Visit (INDEPENDENT_AMBULATORY_CARE_PROVIDER_SITE_OTHER): Payer: 59 | Admitting: Internal Medicine

## 2013-12-09 VITALS — BP 122/80 | HR 87 | Temp 98.7°F | Resp 20 | Ht 63.5 in | Wt 251.0 lb

## 2013-12-09 DIAGNOSIS — E669 Obesity, unspecified: Secondary | ICD-10-CM

## 2013-12-09 DIAGNOSIS — Z0000 Encounter for general adult medical examination without abnormal findings: Secondary | ICD-10-CM

## 2013-12-09 DIAGNOSIS — I10 Essential (primary) hypertension: Secondary | ICD-10-CM

## 2013-12-09 MED ORDER — FUROSEMIDE 20 MG PO TABS: 20.0000 mg | ORAL_TABLET | Freq: Every day | ORAL | Status: DC

## 2013-12-09 MED ORDER — PHENTERMINE HCL 37.5 MG PO CAPS: 37.5000 mg | ORAL_CAPSULE | ORAL | Status: DC

## 2013-12-09 MED ORDER — SUMATRIPTAN SUCCINATE 100 MG PO TABS: 100.0000 mg | ORAL_TABLET | ORAL | Status: DC | PRN

## 2013-12-09 NOTE — Progress Notes (Signed)
Subjective:    Patient ID: [name], female    DOB: 30-May-1966, 48 y.o.   MRN: [id number]  HPI  48 year old patient who is seen today for a preventive health examination.  Medical problems include exogenous obesity, and a history of migraine headaches.  She also has mild treated, hypertension. Her headaches have been stable.  Complaints today include inability lose weight, as well as frequent pedal edema.  She is requesting a diet pill. 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  Very little exercise. Family history both parents have diabetes.  2 brothers and one sister are well  Past Medical History  Diagnosis Date  . 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 Headache(784.0) 06/14/2007  . HYPERTENSION 06/14/2007    History   Social History  . Marital Status: Married    Spouse Name: N/A    Number of Children: N/A  . Years of Education: N/A   Occupational History  . Not on file.   Social History Main Topics  . Smoking status: Never Smoker   . Smokeless tobacco: Never Used  . Alcohol Use: No  . Drug Use: No  . Sexual Activity: Not on file   Other Topics Concern  . Not on file   Social History Narrative  . No narrative on file    Past Surgical History  Procedure Laterality Date  . Dilation and curettage of uterus      x2    Family History  Problem Relation Age of Onset  . Diabetes Mother   . Hypertension Mother     No Known Allergies  Current Outpatient Prescriptions on File Prior to Visit  Medication Sig Dispense Refill  . metoprolol (LOPRESSOR) 50 MG tablet TAKE 1 TABLET BY MOUTH ONCE DAILY  90 tablet  3  . topiramate (TOPAMAX) 50 MG tablet Take 1 tablet (50 mg total) by mouth 2 (two) times daily.  120 tablet  2   No current facility-administered medications on file prior to visit.    BP 122/80  Pulse 87  Temp(Src) 98.7 F (37.1 C) (Oral)  Resp 20  Ht 5' 3.5" (1.613 m)  Wt 251 lb (113.853 kg)  BMI 43.76 kg/m2  SpO2 98%  LMP  12/02/2013       Review of Systems  Constitutional: Negative for fever, appetite change, fatigue and unexpected weight change.  HENT: Negative for congestion, dental problem, ear pain, hearing loss, mouth sores, nosebleeds, sinus pressure, sore throat, tinnitus, trouble swallowing and voice change.   Eyes: Negative for photophobia, pain, redness and visual disturbance.  Respiratory: Negative for cough, chest tightness and shortness of breath.   Cardiovascular: Positive for leg swelling. Negative for chest pain and palpitations.  Gastrointestinal: Negative for nausea, vomiting, abdominal pain, diarrhea, constipation, blood in stool, abdominal distention and rectal pain.  Genitourinary: Negative for dysuria, urgency, frequency, hematuria, flank pain, vaginal bleeding, vaginal discharge, difficulty urinating, genital sores, vaginal pain, menstrual problem and pelvic pain.  Musculoskeletal: Negative for arthralgias, back pain and neck stiffness.  Skin: Negative for rash.  Neurological: Negative for dizziness, syncope, speech difficulty, weakness, light-headedness, numbness and headaches.  Hematological: Negative for adenopathy. Does not bruise/bleed easily.  Psychiatric/Behavioral: Negative for suicidal ideas, behavioral problems, self-injury, dysphoric mood and agitation. The patient is not nervous/anxious.        Objective:   Physical Exam  Constitutional: She is oriented to person, place, and time. She appears well-developed and well-nourished.  HENT:  Head: Normocephalic and atraumatic.  Right Ear: External ear  normal.  Left Ear: External ear normal.  Mouth/Throat: Oropharynx is clear and moist.  Eyes: Conjunctivae and EOM are normal.  Neck: Normal range of motion. Neck supple. No JVD present. No thyromegaly present.  Cardiovascular: Normal rate, regular rhythm, normal heart sounds and intact distal pulses.   No murmur heard. Pulmonary/Chest: Effort normal and breath sounds normal.  She has no wheezes. She has no rales.  Abdominal: Soft. Bowel sounds are normal. She exhibits no distension and no mass. There is no tenderness. There is no rebound and no guarding.  Musculoskeletal: Normal range of motion. She exhibits no edema and no tenderness.  Neurological: She is alert and oriented to person, place, and time. She has normal reflexes. No cranial nerve deficit. She exhibits normal muscle tone. Coordination normal.  Skin: Skin is warm and dry. No rash noted.  Psychiatric: She has a normal mood and affect. Her behavior is normal.          Assessment & Plan:   Preventive health examination Hypertension well controlled Exogenous obesity Migraine headaches  Exercise weight loss, low salt diet.  All encouraged. We'll place on short-term phentermine at her request Dietary referral offered  Recheck 6 months Continue home blood pressure monitoring

## 2013-12-09 NOTE — Patient Instructions (Signed)
Limit your sodium (Salt) intake    It is important that you exercise regularly, at least 20 minutes 3 to 4 times per week.  If you develop chest pain or shortness of breath seek  medical attention.  You need to lose weight.  Consider a lower calorie diet and regular exercise.DASH Diet The DASH diet stands for "Dietary Approaches to Stop Hypertension." It is a healthy eating plan that has been shown to reduce high blood pressure (hypertension) in as little as 14 days, while also possibly providing other significant health benefits. These other health benefits include reducing the risk of breast cancer after menopause and reducing the risk of type 2 diabetes, heart disease, colon cancer, and stroke. Health benefits also include weight loss and slowing kidney failure in patients with chronic kidney disease.  DIET GUIDELINES  Limit salt (sodium). Your diet should contain less than 1500 mg of sodium daily.  Limit refined or processed carbohydrates. Your diet should include mostly whole grains. Desserts and added sugars should be used sparingly.  Include small amounts of heart-healthy fats. These types of fats include nuts, oils, and tub margarine. Limit saturated and trans fats. These fats have been shown to be harmful in the body. CHOOSING FOODS  The following food groups are based on a 2000 calorie diet. See your Registered Dietitian for individual calorie needs. Grains and Grain Products (6 to 8 servings daily)  Eat More Often: Whole-wheat bread, brown rice, whole-grain or wheat pasta, quinoa, popcorn without added fat or salt (air popped).  Eat Less Often: White bread, white pasta, white rice, cornbread. Vegetables (4 to 5 servings daily)  Eat More Often: Fresh, frozen, and canned vegetables. Vegetables may be raw, steamed, roasted, or grilled with a minimal amount of fat.  Eat Less Often/Avoid: Creamed or fried vegetables. Vegetables in a cheese sauce. Fruit (4 to 5 servings daily)  Eat  More Often: All fresh, canned (in natural juice), or frozen fruits. Dried fruits without added sugar. One hundred percent fruit juice ( cup [237 mL] daily).  Eat Less Often: Dried fruits with added sugar. Canned fruit in light or heavy syrup. [employer], Fish, and Poultry (2 servings or less daily. One serving is 3 to 4 oz [85-114 g]).  Eat More Often: Ninety percent or leaner ground beef, tenderloin, sirloin. Round cuts of beef, chicken breast, [place] breast. All fish. Grill, bake, or broil your meat. Nothing should be fried.  Eat Less Often/Avoid: Fatty cuts of meat, [place], or chicken leg, thigh, or wing. Fried cuts of meat or fish. Dairy (2 to 3 servings)  Eat More Often: Low-fat or fat-free milk, low-fat plain or light yogurt, reduced-fat or part-skim cheese.  Eat Less Often/Avoid: Milk (whole, 2%).Whole milk yogurt. Full-fat cheeses. Nuts, Seeds, and Legumes (4 to 5 servings per week)  Eat More Often: All without added salt.  Eat Less Often/Avoid: Salted nuts and seeds, canned beans with added salt. Fats and Sweets (limited)  Eat More Often: Vegetable oils, tub margarines without trans fats, sugar-free gelatin. Mayonnaise and salad dressings.  Eat Less Often/Avoid: Coconut oils, palm oils, butter, stick margarine, cream, half and half, cookies, candy, pie. FOR MORE INFORMATION The Dash Diet Eating Plan: www.dashdiet.org Document Released: 09/25/2011 Document Revised: 12/29/2011 Document Reviewed: 09/25/2011 [facility] Patient Information 2014 [place], [place].

## 2013-12-09 NOTE — Progress Notes (Signed)
Pre-visit discussion using our clinic review tool. No additional management support is needed unless otherwise documented below in the visit note.  

## 2014-02-25 ENCOUNTER — Encounter: Payer: Self-pay | Admitting: Internal Medicine

## 2014-02-28 NOTE — Telephone Encounter (Signed)
Spoke to pt told her paperwork for Exercise Program was done and faxed and someone will contact her regarding program. Pt verbalized understanding.

## 2014-03-01 ENCOUNTER — Other Ambulatory Visit: Payer: Self-pay | Admitting: Internal Medicine

## 2014-04-12 ENCOUNTER — Ambulatory Visit (INDEPENDENT_AMBULATORY_CARE_PROVIDER_SITE_OTHER): Payer: 59 | Admitting: Family Medicine

## 2014-04-12 ENCOUNTER — Telehealth: Payer: Self-pay | Admitting: Internal Medicine

## 2014-04-12 VITALS — BP 130/90 | HR 89 | Temp 97.7°F | Resp 16 | Ht 64.0 in | Wt 234.8 lb

## 2014-04-12 DIAGNOSIS — E041 Nontoxic single thyroid nodule: Secondary | ICD-10-CM

## 2014-04-12 DIAGNOSIS — G43111 Migraine with aura, intractable, with status migrainosus: Secondary | ICD-10-CM

## 2014-04-12 MED ORDER — HYDROCODONE-ACETAMINOPHEN 5-325 MG PO TABS: 1.0000 | ORAL_TABLET | Freq: Four times a day (QID) | ORAL | Status: DC | PRN

## 2014-04-12 MED ORDER — KETOROLAC TROMETHAMINE 60 MG/2ML IM SOLN
60.0000 mg | Freq: Once | INTRAMUSCULAR | Status: AC
  Administered 2014-04-12: 60 mg via INTRAMUSCULAR

## 2014-04-12 MED ORDER — PROMETHAZINE HCL 12.5 MG PO TABS: 12.5000 mg | ORAL_TABLET | Freq: Three times a day (TID) | ORAL | Status: DC | PRN

## 2014-04-12 NOTE — Progress Notes (Signed)
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  She has been nauseated and having some diarrhea  Has not taken phentermine in two weeks  Last took furosemide before her trip to father in [place] in [place] last weekend.  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  She usually takes sedating medicines but she has to work Midwife.  She called out last night from work  Objective:  NAD Neck supple, 1.5 cm firm well circumscribed thyroid nodule midline Fundi:  Normal Neuro:  Normal CN III-XII, motor Chest:  Clear Heart:  Reg, no murmur Ext: no edema  Intractable migraine with aura with status migrainosus - Plan: ketorolac (TORADOL) injection 60 mg, promethazine (PHENERGAN) 12.5 MG tablet, HYDROcodone-acetaminophen (NORCO) 5-325 MG per tablet  Thyroid nodule - Plan: US Soft Tissue Head/Neck  Signed, Robyn Haber, MD

## 2014-04-12 NOTE — Telephone Encounter (Signed)
Patient Information:  Caller Name: Caryl Pina  Phone: [phone]  Patient: [name], [name]  Gender: Female  DOB: 01-12-1966  Age: 48 Years  PCP: Bluford Kaufmann (Family Practice > 27yrs old)  Pregnant: No  Office Follow Up:  Does the office need to follow up with this patient?: Yes  Instructions For The Office: No appt available with PCP or at [facility] office . Patient is going to [facility] for treatment.  RN Note:  Patient would like to come in for injection for pain.  No opening with PCP or at [facility] office today.  Patient states she is going to go to [facility] for treatment.  Symptoms  Reason For Call & Symptoms: Caryl Pina Daughter is calling about her mother having migraines. She wants to come in for shot.  Onset 2 days ago , Monday 04/10/14.  Constant. Patient to the phone- Tylenol, Ibuprofen and Rx wtih no relief.  Headache located right side above eye down to back of head, no vomiting, +nausea and diarrhea. Afebrile. Describled "as very painful"  Reviewed Health History In EMR: Yes  Reviewed Medications In EMR: Yes  Reviewed Allergies In EMR: Yes  Reviewed Surgeries / Procedures: Yes  Date of Onset of Symptoms: 04/10/2014  Treatments Tried: Relpax, ibuprofen , tylenol  Treatments Tried Worked: No OB / GYN:  LMP: 04/12/2014  Guideline(s) Used:  Headache  Disposition Per Guideline:   See Today in Office  Reason For Disposition Reached:   Patient wants to be seen  Advice Given:  Call Back If:  You become worse.  Patient Will Follow Care Advice:  YES

## 2014-04-12 NOTE — Patient Instructions (Signed)

## 2014-04-13 NOTE — Telephone Encounter (Signed)
FYI

## 2014-04-14 ENCOUNTER — Ambulatory Visit
Admission: RE | Admit: 2014-04-14 | Discharge: 2014-04-14 | Disposition: A | Discharge status: Home / Self Care | Payer: 59 | Source: Ambulatory Visit | Attending: Family Medicine | Admitting: Family Medicine

## 2014-04-14 DIAGNOSIS — E041 Nontoxic single thyroid nodule: Secondary | ICD-10-CM

## 2014-04-15 ENCOUNTER — Telehealth: Payer: Self-pay

## 2014-04-15 ENCOUNTER — Other Ambulatory Visit: Payer: Self-pay | Admitting: Family Medicine

## 2014-04-15 ENCOUNTER — Other Ambulatory Visit (INDEPENDENT_AMBULATORY_CARE_PROVIDER_SITE_OTHER): Payer: 59 | Admitting: Family Medicine

## 2014-04-15 DIAGNOSIS — D351 Benign neoplasm of parathyroid gland: Secondary | ICD-10-CM

## 2014-04-15 LAB — TSH: TSH: 1.79 u[IU]/mL (ref 0.350–4.500)

## 2014-04-15 NOTE — Progress Notes (Signed)
Patient ID: [name] MRN: [id number], DOB: [birth year], 48 y.o. Date of Encounter: 04/15/2014, 12:50 PM  Primary Physician: Nyoka Cowden, MD  Chief Complaint: No chief complaint on file.    HPI: 48 y.o. year old female with history below presents for a recheck of a nodule to her neck. [name] questions if the nodules affecting her thyroid or parathyroid could affect her metabolism. The pt reports pain to her bones as well as irritated bowels.    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 Authorizing Provider  furosemide (LASIX) 20 MG tablet Take 1 tablet (20 mg total) by mouth daily. 12/09/13   Marletta Lor, MD  HYDROcodone-acetaminophen (NORCO) 5-325 MG per tablet Take 1 tablet by mouth every 6 (six) hours as needed for moderate pain. 04/12/14   Robyn Haber, MD  metoprolol (LOPRESSOR) 50 MG tablet TAKE 1 TABLET BY MOUTH ONCE DAILY 03/01/14   Marletta Lor, MD  phentermine 37.5 MG capsule Take 1 capsule (37.5 mg total) by mouth every morning. 12/09/13   Marletta Lor, MD  promethazine (PHENERGAN) 12.5 MG tablet Take 1 tablet (12.5 mg total) by mouth every 8 (eight) hours as needed for nausea or vomiting. 04/12/14   Robyn Haber, MD  SUMAtriptan (IMITREX) 100 MG tablet Take 1 tablet (100 mg total) by mouth every 2 (two) hours as needed for migraine. 12/09/13   Marletta Lor, MD  topiramate (TOPAMAX) 50 MG tablet Take 1 tablet (50 mg total) by mouth 2 (two) times daily. 02/25/13   Marletta Lor, MD    Allergies: No Known Allergies  History   Social History   Marital Status: Married    Spouse Name: N/A    Number of Children: N/A   Years of Education: N/A   Occupational History   Not on file.   Social History Main Topics   Smoking status: Never Smoker     Smokeless tobacco: Never Used   Alcohol Use: No   Drug Use: No   Sexual Activity: Not on file   Other Topics Concern   Not on file   Social History Narrative   No narrative on file     Review of Systems: Constitutional: negative for chills, fever, night sweats, weight changes, or fatigue  HEENT: negative for vision changes, hearing loss, congestion, rhinorrhea, ST, epistaxis, or sinus pressure Cardiovascular: negative for chest pain or palpitations Respiratory: negative for hemoptysis, wheezing, shortness of breath, or cough Abdominal: negative for abdominal pain, nausea, vomiting, diarrhea, or constipation Dermatological: negative for rash Neurologic: negative for headache, dizziness, or syncope All other systems reviewed and are otherwise negative with the exception to those above and in the HPI.   Physical Exam: Last menstrual period 04/12/2014., There is no weight on file to calculate BMI. General: Well developed, well nourished, in no acute distress. Head: Normocephalic, atraumatic, eyes without discharge, sclera non-icteric, nares are without discharge. Bilateral auditory canals clear, TM's are without perforation, pearly grey and translucent with reflective cone of light bilaterally. Oral cavity moist, posterior pharynx without exudate, erythema, peritonsillar abscess, or post nasal drip.  Neck: Supple. Midline thyroid located mass Full ROM. No lymphadenopathy. Lungs: Clear bilaterally to auscultation without wheezes, rales, or rhonchi. Breathing is unlabored. Heart: RRR with S1 S2. No murmurs, rubs, or gallops appreciated. Abdomen: Soft, non-tender,  non-distended with normoactive bowel sounds. No hepatomegaly. No rebound/guarding. No obvious abdominal masses. Msk:  Strength and tone normal for age. Extremities/Skin: Warm and dry. No clubbing or cyanosis. No edema. No rashes or suspicious lesions. Neuro: Alert and oriented X 3. Moves all extremities spontaneously.  Gait is normal. CNII-XII grossly in tact. Psych:  Responds to questions appropriately with a normal affect.   Labs: I reviewed the recent ultrasound showing multinodular goiter and a parathyroid adenoma. We will follow through with the test with the radiologist recommended and then decide whether to get an endocrinology or a surgical consult.   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

## 2014-04-15 NOTE — Telephone Encounter (Signed)
See US results 

## 2014-04-15 NOTE — Telephone Encounter (Signed)
Pt calling for ultrasound results. Saw dr Joseph Art. Pt anxious for results. Please call asap.    bf

## 2014-04-17 ENCOUNTER — Other Ambulatory Visit: Payer: Self-pay | Admitting: Family Medicine

## 2014-04-17 DIAGNOSIS — D351 Benign neoplasm of parathyroid gland: Secondary | ICD-10-CM

## 2014-04-17 LAB — PTH, INTACT AND CALCIUM
Calcium: 9.2 mg/dL (ref 8.4–10.5)
PTH: 56.4 pg/mL (ref 14.0–72.0)

## 2014-04-18 ENCOUNTER — Telehealth: Payer: Self-pay

## 2014-04-18 NOTE — Telephone Encounter (Signed)
Ses labs

## 2014-04-18 NOTE — Telephone Encounter (Signed)
PATIENT IS RETURNING A CALL FROM THE LAB. SHE SAID SOMEONE CALLED HER ABOUT 30 MINUTES AGO. BEST PHONE [phone] (CELL)  [place]

## 2014-05-24 ENCOUNTER — Telehealth: Payer: Self-pay | Admitting: Internal Medicine

## 2014-05-24 NOTE — Telephone Encounter (Signed)
Pt states she picked up fmla papers and would like DR K to change the frequency of how often she has migraines . Dr Raliegh Ip states migraines about one every 3 months, pt states its more often than that. Pt would /needs to get that changed asap.and a cb asap/

## 2014-05-25 NOTE — Telephone Encounter (Signed)
Pt is following up would like to know if dr.k will correct the fmla paperwork

## 2014-05-25 NOTE — Telephone Encounter (Signed)
Spoke to pt, told her discussed with Dr. Raliegh Ip and he will change FMLA paperwork for you. Told pt to bring back forms again and print the sheet he needs to correct and ask for me when you come and I will make sure it gets done. Pt verbalized understanding.

## 2014-05-26 NOTE — Telephone Encounter (Signed)
Pt came by office and form corrected by Dr. Raliegh Ip and original given to pt and copy made for chart.

## 2014-07-21 ENCOUNTER — Other Ambulatory Visit: Payer: Self-pay | Admitting: Internal Medicine

## 2014-08-07 ENCOUNTER — Other Ambulatory Visit: Payer: Self-pay | Admitting: Internal Medicine

## 2014-08-08 NOTE — Telephone Encounter (Signed)
ok 

## 2014-08-08 NOTE — Telephone Encounter (Signed)
Okay to refill? 

## 2014-08-08 NOTE — Telephone Encounter (Signed)
Rx called in to pharmacy. 

## 2014-10-30 ENCOUNTER — Encounter: Payer: Self-pay | Admitting: Family Medicine

## 2014-10-30 ENCOUNTER — Ambulatory Visit (INDEPENDENT_AMBULATORY_CARE_PROVIDER_SITE_OTHER): Payer: 59 | Admitting: Family Medicine

## 2014-10-30 VITALS — BP 118/82 | HR 80 | Temp 99.1°F | Ht 64.0 in | Wt 236.7 lb

## 2014-10-30 DIAGNOSIS — J069 Acute upper respiratory infection, unspecified: Secondary | ICD-10-CM

## 2014-10-30 MED ORDER — HYDROCODONE-HOMATROPINE 5-1.5 MG/5ML PO SYRP: 5.0000 mL | ORAL_SOLUTION | Freq: Three times a day (TID) | ORAL | Status: DC | PRN

## 2014-10-30 NOTE — Progress Notes (Signed)
HPI:  URI: -started: 5 days ago -symptoms:nasal congestion, sore throat, cough, fever around 100, body aches -denies: SOB, NVD, ear, tooth pain -has tried: musinex, nyquil, dayquil -sick contacts/travel/risks: denies flu exposure, tick exposure or or Ebola risks -family members with same symptoms  ROS: See pertinent positives and negatives per HPI.  Past Medical History  Diagnosis Date  . 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 Headache(784.0) 06/14/2007  . HYPERTENSION 06/14/2007    Past Surgical History  Procedure Laterality Date  . Dilation and curettage of uterus      x2  . Cesarean section      Family History  Problem Relation Age of Onset  . Diabetes Mother   . Hypertension Mother     History   Social History  . Marital Status: Married    Spouse Name: N/A    Number of Children: N/A  . Years of Education: N/A   Social History Main Topics  . Smoking status: Never Smoker   . Smokeless tobacco: Never Used  . Alcohol Use: No  . Drug Use: No  . Sexual Activity: None   Other Topics Concern  . None   Social History Narrative    Current outpatient prescriptions: furosemide (LASIX) 20 MG tablet, Take 1 tablet (20 mg total) by mouth daily., Disp: 30 tablet, Rfl: 3;  HYDROcodone-acetaminophen (NORCO) 5-325 MG per tablet, Take 1 tablet by mouth every 6 (six) hours as needed for moderate pain., Disp: 30 tablet, Rfl: 0;  metoprolol (LOPRESSOR) 50 MG tablet, TAKE 1 TABLET BY MOUTH ONCE DAILY, Disp: 90 tablet, Rfl: 1 promethazine (PHENERGAN) 12.5 MG tablet, Take 1 tablet (12.5 mg total) by mouth every 8 (eight) hours as needed for nausea or vomiting., Disp: 20 tablet, Rfl: 0;  SUMAtriptan (IMITREX) 100 MG tablet, Take 1 tablet (100 mg total) by mouth every 2 (two) hours as needed for migraine., Disp: 10 tablet, Rfl: 5;  topiramate (TOPAMAX) 50 MG tablet, TAKE 1 TABLET BY MOUTH TWICE DAILY, Disp: 120 tablet, Rfl: 0 HYDROcodone-homatropine (HYCODAN) 5-1.5 MG/5ML syrup, Take 5 mLs  by mouth every 8 (eight) hours as needed for cough., Disp: 120 mL, Rfl: 0  EXAM:  Filed Vitals:   10/30/14 1535  BP: 118/82  Pulse: 80  Temp: 99.1 F (37.3 C)    Body mass index is 40.61 kg/(m^2).  GENERAL: vitals reviewed and listed above, alert, oriented, appears well hydrated and in no acute distress  HEENT: atraumatic, conjunttiva clear, no obvious abnormalities on inspection of external nose and ears, normal appearance of ear canals and TMs, clear nasal congestion, mild post oropharyngeal erythema with PND, no tonsillar edema or exudate, no sinus TTP  NECK: no obvious masses on inspection  LUNGS: clear to auscultation bilaterally, no wheezes, rales or rhonchi, good air movement  CV: HRRR, no peripheral edema  MS: moves all extremities without noticeable abnormality  PSYCH: pleasant and cooperative, no obvious depression or anxiety  ASSESSMENT AND PLAN:  Discussed the following assessment and plan:  Acute upper respiratory infection - Plan: HYDROcodone-homatropine (HYCODAN) 5-1.5 MG/5ML syrup  -given HPI and exam findings today, a serious infection or illness is unlikely. We discussed potential etiologies, with VURI being most likely, and advised supportive care and monitoring. We discussed treatment side effects, likely course, antibiotic misuse, transmission, and signs of developing a serious illness. -perhaps mild flu but out of treatment window for tamiflu being very helpful and she opted not to test -of course, we advised to return or notify a doctor immediately if  symptoms worsen or persist or new concerns arise.    Patient Instructions  INSTRUCTIONS FOR UPPER RESPIRATORY INFECTION:  -plenty of rest and fluids  -nasal saline wash 2-3 times daily (use prepackaged nasal saline or bottled/distilled water if making your own)   -can use tylenol or ibuprofen as directed for aches and sorethroat  -in the winter time, using a humidifier at night is helpful (please  follow cleaning instructions)  -if you are taking a cough medication - use only as directed, may also try a teaspoon of honey to coat the throat and throat lozenges  -for sore throat, salt water gargles can help  -follow up if you have fevers, facial pain, tooth pain, difficulty breathing or are worsening or not getting better in 5-7 days      Braylynn Lewing R.

## 2014-10-30 NOTE — Patient Instructions (Signed)
INSTRUCTIONS FOR UPPER RESPIRATORY INFECTION:  -plenty of rest and fluids  -nasal saline wash 2-3 times daily (use prepackaged nasal saline or bottled/distilled water if making your own)   -can use tylenol or ibuprofen as directed for aches and sorethroat  -in the winter time, using a humidifier at night is helpful (please follow cleaning instructions)  -if you are taking a cough medication - use only as directed, may also try a teaspoon of honey to coat the throat and throat lozenges  -for sore throat, salt water gargles can help  -follow up if you have fevers, facial pain, tooth pain, difficulty breathing or are worsening or not getting better in 5-7 days  

## 2014-10-30 NOTE — Progress Notes (Signed)
Pre visit review using our clinic review tool, if applicable. No additional management support is needed unless otherwise documented below in the visit note. 

## 2014-12-07 ENCOUNTER — Ambulatory Visit: Payer: 59 | Admitting: Internal Medicine

## 2014-12-08 ENCOUNTER — Encounter: Payer: Self-pay | Admitting: Internal Medicine

## 2014-12-08 ENCOUNTER — Ambulatory Visit (INDEPENDENT_AMBULATORY_CARE_PROVIDER_SITE_OTHER): Payer: 59 | Admitting: Internal Medicine

## 2014-12-08 VITALS — BP 120/80 | HR 122 | Temp 98.5°F | Wt 242.0 lb

## 2014-12-08 DIAGNOSIS — I10 Essential (primary) hypertension: Secondary | ICD-10-CM

## 2014-12-08 DIAGNOSIS — B9789 Other viral agents as the cause of diseases classified elsewhere: Secondary | ICD-10-CM

## 2014-12-08 DIAGNOSIS — E669 Obesity, unspecified: Secondary | ICD-10-CM

## 2014-12-08 DIAGNOSIS — Z8669 Personal history of other diseases of the nervous system and sense organs: Secondary | ICD-10-CM

## 2014-12-08 DIAGNOSIS — E042 Nontoxic multinodular goiter: Secondary | ICD-10-CM

## 2014-12-08 DIAGNOSIS — J069 Acute upper respiratory infection, unspecified: Secondary | ICD-10-CM

## 2014-12-08 MED ORDER — HYDROCODONE-HOMATROPINE 5-1.5 MG/5ML PO SYRP: 5.0000 mL | ORAL_SOLUTION | Freq: Three times a day (TID) | ORAL | Status: DC | PRN

## 2014-12-08 NOTE — Patient Instructions (Signed)
Acute bronchitis symptoms are generally not helped by antibiotics.  Take over-the-counter expectorants and cough medications such as  Mucinex DM.  Call if there is no improvement in 5 to 7 days or if  you develop worsening cough, fever, or new symptoms, such as shortness of breath or chest pain.  TREATMENT  Acute bronchitis usually goes away in a couple weeks. Oftentimes, no medical treatment is necessary. Medicines are sometimes given for relief of fever or cough. Antibiotic medicines are usually not needed but may be prescribed in certain situations. In some cases, an inhaler may be recommended to help reduce shortness of breath and control the cough. A cool mist vaporizer may also be used to help thin bronchial secretions and make it easier to clear the chest.  HOME CARE INSTRUCTIONS  Get plenty of rest.  Drink enough fluids to keep your urine clear or pale yellow (unless you have a medical condition that requires fluid restriction). Increasing fluids may help thin your respiratory secretions (sputum) and reduce chest congestion, and it will prevent dehydration.  Take medicines only as directed by your health care provider.  If you were prescribed an antibiotic medicine, finish it all even if you start to feel better.  Avoid smoking and secondhand smoke. Exposure to cigarette smoke or irritating chemicals will make bronchitis worse. If you are a smoker, consider using nicotine gum or skin patches to help control withdrawal symptoms. Quitting smoking will help your lungs heal faster.  Reduce the chances of another bout of acute bronchitis by washing your hands frequently, avoiding people with cold symptoms, and trying not to touch your hands to your mouth, nose, or eyes.

## 2014-12-08 NOTE — Progress Notes (Signed)
Pre visit review using our clinic review tool, if applicable. No additional management support is needed unless otherwise documented below in the visit note. 

## 2014-12-08 NOTE — Progress Notes (Signed)
   Subjective:    Patient ID: [name], female    DOB: [birth year], 49 y.o.   MRN: [id number]  HPI 49 year old patient who states that she has been ill since January 4.  She has seen one week later and treated symptomatically for a viral URI.  Her chief complaint is nonproductive cough.  She otherwise feels fairly well.  She does have a daughter who works at a daycare center who has been intermittently sick. She has a history of a multinodular goiter and ultrasound was checked in June of last year.  She states that she has a choking sensation, especially when trying to sleep supine. There is been no recent fever, shortness of breath  She has a history migraine headaches and has been on Topamax prophylactically.  She still has migraines monthly.  She is requesting FMLA form completion    Review of Systems  Constitutional: Negative.   HENT: Negative for congestion, dental problem, hearing loss, rhinorrhea, sinus pressure, sore throat and tinnitus.   Eyes: Negative for pain, discharge and visual disturbance.  Respiratory: Positive for cough. Negative for shortness of breath.   Cardiovascular: Negative for chest pain, palpitations and leg swelling.  Gastrointestinal: Negative for nausea, vomiting, abdominal pain, diarrhea, constipation, blood in stool and abdominal distention.  Genitourinary: Negative for dysuria, urgency, frequency, hematuria, flank pain, vaginal bleeding, vaginal discharge, difficulty urinating, vaginal pain and pelvic pain.  Musculoskeletal: Negative for joint swelling, arthralgias and gait problem.  Skin: Negative for rash.  Neurological: Negative for dizziness, syncope, speech difficulty, weakness, numbness and headaches.  Hematological: Negative for adenopathy.  Psychiatric/Behavioral: Negative for behavioral problems, dysphoric mood and agitation. The patient is not nervous/anxious.        Objective:   Physical Exam  Constitutional: She is oriented to person,  place, and time. She appears well-developed and well-nourished.  HENT:  Head: Normocephalic.  Right Ear: External ear normal.  Left Ear: External ear normal.  Mouth/Throat: Oropharynx is clear and moist.  Eyes: Conjunctivae and EOM are normal. Pupils are equal, round, and reactive to light.  Neck: Normal range of motion. Neck supple. Thyromegaly present.  Moderate thyromegaly  Cardiovascular: Normal rate, regular rhythm, normal heart sounds and intact distal pulses.   Pulmonary/Chest: Effort normal and breath sounds normal. No respiratory distress. She has no wheezes. She has no rales.  Abdominal: Soft. Bowel sounds are normal. She exhibits no mass. There is no tenderness.  Musculoskeletal: Normal range of motion.  Lymphadenopathy:    She has no cervical adenopathy.  Neurological: She is alert and oriented to person, place, and time.  Skin: Skin is warm and dry. No rash noted.  Psychiatric: She has a normal mood and affect. Her behavior is normal.          Assessment & Plan:   Refractory viral URI.  Will continue symptomatic treatment Goiter/euthyroid.  Discussed at length.  Doubtful daughter is causing symptoms.  Migraine headaches.  Continues to have migraines and states she misses work on the average 2 times per month.  FMLA forms completed.  Will continue on Topamax prophylactically.  Recheck 3 months

## 2014-12-13 ENCOUNTER — Telehealth: Payer: Self-pay | Admitting: Internal Medicine

## 2014-12-13 NOTE — Telephone Encounter (Signed)
Pt called to say that she gave FMLA paper work to Dr Raliegh Ip on Friday 12/08/14 . She called today and asked if paperwork was ready and is requesting a call back.

## 2014-12-14 NOTE — Telephone Encounter (Signed)
Spoke to pt, told her FMLA paperwork is ready for pickup, will be at the front desk. Pt verbalized understanding.

## 2015-01-10 ENCOUNTER — Ambulatory Visit (INDEPENDENT_AMBULATORY_CARE_PROVIDER_SITE_OTHER): Payer: 59

## 2015-01-10 ENCOUNTER — Telehealth: Payer: Self-pay | Admitting: *Deleted

## 2015-01-10 ENCOUNTER — Ambulatory Visit (HOSPITAL_COMMUNITY)
Admission: RE | Admit: 2015-01-10 | Discharge: 2015-01-10 | Disposition: A | Discharge status: Home / Self Care | Payer: 59 | Source: Ambulatory Visit | Attending: Family Medicine | Admitting: Family Medicine

## 2015-01-10 ENCOUNTER — Ambulatory Visit (INDEPENDENT_AMBULATORY_CARE_PROVIDER_SITE_OTHER): Payer: 59 | Admitting: Family Medicine

## 2015-01-10 VITALS — BP 144/88 | HR 106 | Temp 98.5°F | Resp 17 | Ht 63.5 in | Wt 242.0 lb

## 2015-01-10 DIAGNOSIS — R079 Chest pain, unspecified: Secondary | ICD-10-CM | POA: Insufficient documentation

## 2015-01-10 DIAGNOSIS — R05 Cough: Secondary | ICD-10-CM | POA: Diagnosis not present

## 2015-01-10 DIAGNOSIS — R059 Cough, unspecified: Secondary | ICD-10-CM

## 2015-01-10 DIAGNOSIS — R000 Tachycardia, unspecified: Secondary | ICD-10-CM | POA: Diagnosis not present

## 2015-01-10 DIAGNOSIS — R0602 Shortness of breath: Secondary | ICD-10-CM

## 2015-01-10 DIAGNOSIS — R778 Other specified abnormalities of plasma proteins: Secondary | ICD-10-CM | POA: Diagnosis not present

## 2015-01-10 LAB — POCT CBC
Granulocyte percent: 62.9 %G (ref 37–80)
HCT, POC: 43.9 % (ref 37.7–47.9)
HEMOGLOBIN: 13.4 g/dL (ref 12.2–16.2)
Lymph, poc: 2.5 (ref 0.6–3.4)
MCH, POC: 25.6 pg — AB (ref 27–31.2)
MCHC: 30.7 g/dL — AB (ref 31.8–35.4)
MCV: 83.6 fL (ref 80–97)
MID (cbc): 0.4 (ref 0–0.9)
MPV: 7.3 fL (ref 0–99.8)
POC GRANULOCYTE: 4.9 (ref 2–6.9)
POC LYMPH PERCENT: 32 %L (ref 10–50)
POC MID %: 5.1 % (ref 0–12)
Platelet Count, POC: 302 10*3/uL (ref 142–424)
RBC: 5.25 M/uL (ref 4.04–5.48)
RDW, POC: 14.7 %
WBC: 7.8 10*3/uL (ref 4.6–10.2)

## 2015-01-10 LAB — BASIC METABOLIC PANEL
BUN: 9 mg/dL (ref 6–23)
CHLORIDE: 107 meq/L (ref 96–112)
CO2: 22 mEq/L (ref 19–32)
Calcium: 9 mg/dL (ref 8.4–10.5)
Creat: 0.68 mg/dL (ref 0.50–1.10)
Glucose, Bld: 104 mg/dL — ABNORMAL HIGH (ref 70–99)
POTASSIUM: 4 meq/L (ref 3.5–5.3)
SODIUM: 138 meq/L (ref 135–145)

## 2015-01-10 LAB — POCT URINE PREGNANCY: PREG TEST UR: NEGATIVE

## 2015-01-10 LAB — D-DIMER, QUANTITATIVE: D-Dimer, Quant: 0.55 ug/mL-FEU — ABNORMAL HIGH (ref 0.00–0.48)

## 2015-01-10 LAB — TROPONIN I: Troponin I: <0.01 ng/mL (ref <0.06)

## 2015-01-10 MED ORDER — IOHEXOL 350 MG/ML SOLN
100.0000 mL | Freq: Once | INTRAVENOUS | Status: AC | PRN
  Administered 2015-01-10: 100 mL via INTRAVENOUS

## 2015-01-10 MED ORDER — PREDNISONE 20 MG PO TABS: ORAL_TABLET | ORAL | Status: DC

## 2015-01-10 MED ORDER — AZITHROMYCIN 250 MG PO TABS: ORAL_TABLET | ORAL | Status: DC

## 2015-01-10 NOTE — Patient Instructions (Signed)
I will give you a call asap with your labs. If your D dimer is negative we can feel assured that you do not have a blood clot in your lung Assuming we do not find any other cause for your symptoms we will have you take the prednisone as directed as well as the antibiotic (azithromycin).

## 2015-01-10 NOTE — Progress Notes (Addendum)
[facility] [address], [place] [address] [phone]  Date:  01/10/2015   Name:  DECEMBER HEDTKE   DOB:  [birth year]   MRN:  [id number]  PCP:  Nyoka Cowden, MD    Chief Complaint: Chest Pain; Back Pain; and Cough   History of Present Illness:  [name] is a 49 y.o. very pleasant female patient who presents with the following:  She had the flu in January.  She saw her doctor in February and was reassured, dx with viral bronchitis.  She has not noted a fever. She is still coughing, and notes that her pulse has been running high, about 120.  She works at [employer] and checks her pulse sometimes at work.  She is coughing up some mucus.   She has used some mucinex DM for her sx She is not coughing up any blood.   Notes that she feels more tired than usual, gets winded more easily with walking up stairs.   She has no history of DVT or PE.   She is not on any estrogen, non smoker.  No unsual calf pains but she does tend to get some leg cramps sometime. She has chest pain only with cough, not with exertion or rest.  She feels that this is MSK in origin  Patient Active Problem List   Diagnosis Date Noted  . Multinodular goiter 12/08/2014  . Hx of migraine headaches 12/08/2014  . Obesity 03/14/2010  . Essential hypertension 06/14/2007  . HEADACHE 06/14/2007    Past Medical History  Diagnosis Date  . 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 Headache(784.0) 06/14/2007  . HYPERTENSION 06/14/2007    Past Surgical History  Procedure Laterality Date  . Dilation and curettage of uterus      x2  . Cesarean section      History  Substance Use Topics  . Smoking status: Never Smoker   . Smokeless tobacco: Never Used  . Alcohol Use: No    Family History  Problem Relation Age of Onset  . Diabetes Mother   . Hypertension Mother     No Known Allergies  Medication list has been reviewed and updated.  Current Outpatient Prescriptions on File Prior  to Visit  Medication Sig Dispense Refill  . HYDROcodone-acetaminophen (NORCO) 5-325 MG per tablet Take 1 tablet by mouth every 6 (six) hours as needed for moderate pain. 30 tablet 0  . HYDROcodone-homatropine (HYCODAN) 5-1.5 MG/5ML syrup Take 5 mLs by mouth every 8 (eight) hours as needed for cough. 120 mL 0  . metoprolol (LOPRESSOR) 50 MG tablet TAKE 1 TABLET BY MOUTH ONCE DAILY 90 tablet 1  . promethazine (PHENERGAN) 12.5 MG tablet Take 1 tablet (12.5 mg total) by mouth every 8 (eight) hours as needed for nausea or vomiting. 20 tablet 0  . SUMAtriptan (IMITREX) 100 MG tablet Take 1 tablet (100 mg total) by mouth every 2 (two) hours as needed for migraine. 10 tablet 5  . topiramate (TOPAMAX) 50 MG tablet TAKE 1 TABLET BY MOUTH TWICE DAILY 120 tablet 0   No current facility-administered medications on file prior to visit.    Review of Systems:  As per HPI- otherwise negative.   Physical Examination: Filed Vitals:   01/10/15 1322  BP: 144/88  Pulse: 106  Temp: 98.5 F (36.9 C)  Resp: 17   Filed Vitals:   01/10/15 1322  Height: 5' 3.5" (1.613 m)  Weight: 242 lb (109.77 kg)   Body mass index  is 42.19 kg/(m^2). Ideal Body Weight: Weight in (lb) to have BMI = 25: 143.1  GEN: WDWN, NAD, Non-toxic, A & O x 3, obese, looks well HEENT: Atraumatic, Normocephalic. Neck supple. No masses, No LAD.  Bilateral TM wnl, oropharynx normal.  PEERL,EOMI.   Ears and Nose: No external deformity. CV: RRR, No M/G/R. No JVD. No thrill. No extra heart sounds.  Rate around 100 BPM at my exam PULM: CTA B, no wheezes, crackles, rhonchi. No retractions. No resp. distress. No accessory muscle use. ABD: S, NT, ND EXTR: No c/c/e NEURO Normal gait.  PSYCH: Normally interactive. Conversant. Not depressed or anxious appearing.  Calm demeanor.  No calf swelling or tenderness  Easily able to reproduce her CP by pressing on her chest wall   [facility] reading (PRIMARY) by  Dr. Lorelei Pont. CXR: negative  CHEST 2  VIEW  COMPARISON: PA and lateral chest of December 22, 2008  FINDINGS: The lungs are adequately inflated. There is no focal infiltrate. The heart and pulmonary vascularity are normal. The trachea is midline. The bony thorax is unremarkable.  IMPRESSION: There is no active cardiopulmonary disease.  Results for orders placed or performed in visit on 01/10/15  D-dimer, quantitative  Result Value Ref Range   D-Dimer, Quant 0.55 (H) 0.00 - 0.48 ug/mL-FEU  Troponin I  Result Value Ref Range   Troponin I  <0.06 ng/mL  POCT urine pregnancy  Result Value Ref Range   Preg Test, Ur Negative   POCT CBC  Result Value Ref Range   WBC 7.8 4.6 - 10.2 K/uL   Lymph, poc 2.5 0.6 - 3.4   POC LYMPH PERCENT 32.0 10 - 50 %L   MID (cbc) 0.4 0 - 0.9   POC MID % 5.1 0 - 12 %M   POC Granulocyte 4.9 2 - 6.9   Granulocyte percent 62.9 37 - 80 %G   RBC 5.25 4.04 - 5.48 M/uL   Hemoglobin 13.4 12.2 - 16.2 g/dL   HCT, POC 43.9 37.7 - 47.9 %   MCV 83.6 80 - 97 fL   MCH, POC 25.6 (A) 27 - 31.2 pg   MCHC 30.7 (A) 31.8 - 35.4 g/dL   RDW, POC 14.7 %   Platelet Count, POC 302 142 - 424 K/uL   MPV 7.3 0 - 99.8 fL    Assessment and Plan: Cough - Plan: DG Chest 2 View, POCT urine pregnancy, D-dimer, quantitative, POCT CBC, predniSONE (DELTASONE) 20 MG tablet, azithromycin (ZITHROMAX) 250 MG tablet, Basic metabolic panel, CT Angio Chest PE W/Cm &/Or Wo Cm  Tachycardia - Plan: DG Chest 2 View, D-dimer, quantitative, Troponin I, CT Angio Chest PE W/Cm &/Or Wo Cm  SOB (shortness of breath) - Plan: D-dimer, quantitative, CT Angio Chest PE W/Cm &/Or Wo Cm  Discussed with pt in detail.  Her sx are likely due to persistent bronchospasm and congestion, and at this time is reasonable to use abx and steroids.  However her sx also raise concern of PE.  Will proceed with D dimer and then to CT angio is positive.  Will also check a troponin.    Meds ordered this encounter  Medications  . predniSONE (DELTASONE) 20 MG  tablet    Sig: Take 2 pills a day for 3 days    Dispense:  6 tablet    Refill:  0  . azithromycin (ZITHROMAX) 250 MG tablet    Sig: Use as a zpack    Dispense:  6 tablet    Refill:  0   Signed Lamar Blinks, MD  5:15 pm. Received positive D dimer- will send for a CT angiogram now.    Received CT angio at approx 9pm.  Called pt and gave negative result.  She is relieved and will proceed with plan for abx and prednisone. She will let me know if not feeling better in the next few days.  Her troponin is expected back soon per [facility] labs.  I will watch for this result.  CT ANGIOGRAPHY CHEST WITH CONTRAST  TECHNIQUE: Multidetector CT imaging of the chest was performed using the standard protocol during bolus administration of intravenous contrast. Multiplanar CT image reconstructions and MIPs were obtained to evaluate the vascular anatomy.  CONTRAST: 129mL OMNIPAQUE IOHEXOL 350 MG/ML SOLN  COMPARISON: 12/22/2008  FINDINGS: There are streak artifacts from patient's large body habitus. The study is of excellent technical quality. No pulmonary embolus is noted.  Images of the thoracic inlet are unremarkable. Sagittal images of the spine shows mild degenerative changes thoracic spine. Central airways are patent. There is no mediastinal hematoma or adenopathy. No aortic aneurysm. Heart size is within normal limits. The visualized upper abdomen is unremarkable.  Images of the lung parenchyma shows no acute infiltrate or pleural effusion. No pulmonary edema. No pneumothorax. No focal consolidation.  Review of the MIP images confirms the above findings.  IMPRESSION: 1. No pulmonary embolus. 2. No acute infiltrate or pulmonary edema.  Called [facility] at 10pm. The stat troponin is still not resulted.  I am not sure why this stat lab has not been done during the promised 4 hour turnaround.  I received the other stat lab (d dimer) hours ago. The staff member Shirlean Mylar stated that she  would contact the lab and get this test done, and will contact me if positive.

## 2015-01-16 ENCOUNTER — Other Ambulatory Visit: Payer: Self-pay | Admitting: Internal Medicine

## 2015-01-18 ENCOUNTER — Telehealth: Payer: Self-pay | Admitting: Internal Medicine

## 2015-01-18 NOTE — Telephone Encounter (Addendum)
Pt states her FMLA was filled out incorrectly,  and the way it is written is pt getsmigraines one every 3 months.  Pt states [employer] now does our FMLA  it has never been an issue before. Pt would like dr Raliegh Ip to redo  Pt will drop paperwork for him to redo.

## 2015-01-22 NOTE — Telephone Encounter (Signed)
Pt dropped of paperwork to redo. Will discuss with Dr.K.

## 2015-01-23 ENCOUNTER — Telehealth: Payer: Self-pay | Admitting: Internal Medicine

## 2015-01-23 DIAGNOSIS — G43111 Migraine with aura, intractable, with status migrainosus: Secondary | ICD-10-CM

## 2015-01-23 NOTE — Telephone Encounter (Signed)
Please advise if okay to order Phenergan and Hydrocodone for migraines was last prescribed by Urgent care provider in June.

## 2015-01-23 NOTE — Telephone Encounter (Signed)
Left message on voicemail to call office.  

## 2015-01-23 NOTE — Telephone Encounter (Signed)
Pt needs refill on phenergan call [facility]. Pt also would like new rx hydrocodone for migraine pain

## 2015-01-23 NOTE — Telephone Encounter (Signed)
Ok  #30 each

## 2015-01-24 MED ORDER — PROMETHAZINE HCL 12.5 MG PO TABS: 12.5000 mg | ORAL_TABLET | Freq: Three times a day (TID) | ORAL | Status: DC | PRN

## 2015-01-24 MED ORDER — HYDROCODONE-ACETAMINOPHEN 5-325 MG PO TABS: 1.0000 | ORAL_TABLET | Freq: Four times a day (QID) | ORAL | Status: DC | PRN

## 2015-01-24 NOTE — Telephone Encounter (Signed)
Spoke to pt, asked her what needed to be changed on FMLA papers? Pt stated the section where it says how many days out and how often. Asked her how often getting Migraines? Pt stated 1-2 times a month and out 1-2 days. Told pt okay will have Dr.K change on FMLA papers and they will be ready this afternoon along with your prescriptions. Pt verbalized understanding.

## 2015-01-24 NOTE — Telephone Encounter (Signed)
Pt notified Rx ready for pickup. Rx printed and signed.  

## 2015-04-25 NOTE — Telephone Encounter (Signed)
Error

## 2015-06-15 ENCOUNTER — Telehealth: Payer: Self-pay | Admitting: Internal Medicine

## 2015-06-15 NOTE — Telephone Encounter (Signed)
Pt's FMLA papers have expirered and her company is requesting new paperwork, [employer]. Pt is needing this paperwork ASAP. If she will get to [name] by Monday, pt would like a time frame when she could get back.

## 2015-06-15 NOTE — Telephone Encounter (Signed)
[name], pt needs an appt was suppose to come back in 3 months, last seen 11/2014. Have pt bring paperwork to appt and block 30 minutes.

## 2015-06-15 NOTE — Telephone Encounter (Signed)
Pt aware she needs to be seen,  Pt has made appt for 8:45 Monday am

## 2015-06-18 ENCOUNTER — Encounter: Payer: Self-pay | Admitting: Internal Medicine

## 2015-06-18 ENCOUNTER — Ambulatory Visit (INDEPENDENT_AMBULATORY_CARE_PROVIDER_SITE_OTHER): Payer: 59 | Admitting: Internal Medicine

## 2015-06-18 VITALS — BP 138/80 | HR 87 | Temp 98.6°F | Resp 20 | Ht 63.5 in | Wt 246.0 lb

## 2015-06-18 DIAGNOSIS — I10 Essential (primary) hypertension: Secondary | ICD-10-CM | POA: Diagnosis not present

## 2015-06-18 DIAGNOSIS — E042 Nontoxic multinodular goiter: Secondary | ICD-10-CM

## 2015-06-18 DIAGNOSIS — Z8669 Personal history of other diseases of the nervous system and sense organs: Secondary | ICD-10-CM | POA: Diagnosis not present

## 2015-06-18 LAB — LIPID PANEL
CHOLESTEROL: 132 mg/dL (ref 0–200)
HDL: 33.2 mg/dL — ABNORMAL LOW (ref >39.00)
LDL Cholesterol: 71 mg/dL (ref 0–99)
NONHDL: 99.18
Total CHOL/HDL Ratio: 4
Triglycerides: 139 mg/dL (ref 0.0–149.0)
VLDL: 27.8 mg/dL (ref 0.0–40.0)

## 2015-06-18 LAB — COMPREHENSIVE METABOLIC PANEL
ALK PHOS: 79 U/L (ref 39–117)
ALT: 10 U/L (ref 0–35)
AST: 14 U/L (ref 0–37)
Albumin: 3.7 g/dL (ref 3.5–5.2)
BUN: 9 mg/dL (ref 6–23)
CO2: 29 mEq/L (ref 19–32)
CREATININE: 0.83 mg/dL (ref 0.40–1.20)
Calcium: 8.7 mg/dL (ref 8.4–10.5)
Chloride: 107 mEq/L (ref 96–112)
GFR: 94.31 mL/min (ref >60.00)
GLUCOSE: 108 mg/dL — AB (ref 70–99)
POTASSIUM: 4.3 meq/L (ref 3.5–5.1)
SODIUM: 141 meq/L (ref 135–145)
TOTAL PROTEIN: 7 g/dL (ref 6.0–8.3)
Total Bilirubin: 0.3 mg/dL (ref 0.2–1.2)

## 2015-06-18 LAB — TSH: TSH: 0.87 u[IU]/mL (ref 0.35–4.50)

## 2015-06-18 LAB — CBC WITH DIFFERENTIAL/PLATELET
BASOS PCT: 0.6 % (ref 0.0–3.0)
Basophils Absolute: 0 10*3/uL (ref 0.0–0.1)
EOS ABS: 0.2 10*3/uL (ref 0.0–0.7)
Eosinophils Relative: 2.7 % (ref 0.0–5.0)
HCT: 40.6 % (ref 36.0–46.0)
Hemoglobin: 13.6 g/dL (ref 12.0–15.0)
LYMPHS ABS: 1.7 10*3/uL (ref 0.7–4.0)
Lymphocytes Relative: 25.7 % (ref 12.0–46.0)
MCHC: 33.5 g/dL (ref 30.0–36.0)
MCV: 79.8 fl (ref 78.0–100.0)
MONO ABS: 0.5 10*3/uL (ref 0.1–1.0)
Monocytes Relative: 8.1 % (ref 3.0–12.0)
NEUTROS PCT: 62.9 % (ref 43.0–77.0)
Neutro Abs: 4.2 10*3/uL (ref 1.4–7.7)
Platelets: 286 10*3/uL (ref 150.0–400.0)
RBC: 5.09 Mil/uL (ref 3.87–5.11)
RDW: 14.4 % (ref 11.5–15.5)
WBC: 6.7 10*3/uL (ref 4.0–10.5)

## 2015-06-18 NOTE — Progress Notes (Signed)
Pre visit review using our clinic review tool, if applicable. No additional management support is needed unless otherwise documented below in the visit note. 

## 2015-06-18 NOTE — Patient Instructions (Signed)
Limit your sodium (Salt) intake  Please check your blood pressure on a regular basis.  If it is consistently greater than 150/90, please make an office appointment.  You need to lose weight.  Consider a lower calorie diet and regular exercise.  Return in 6 months for follow-up  

## 2015-06-18 NOTE — Progress Notes (Signed)
Subjective:    Patient ID: [name], female    DOB: Jan 17, 1966, 49 y.o.   MRN: [id number]  HPI  49 year old patient who is seen today in follow-up.  She has a history of essential hypertension.  She also has a history of migraine headaches and is seen today for FMLA form completion.  She continues to have headaches, 0-3 times per week and is out of work to 0-3 times per month.  She is on Topamax prophylaxis and does use Phenergan, Vicodin and Imitrex for acute headaches. She has obesity, multinodular goiter and essential hypertension. Migraines are frequently related to menstrual cycle.  Wt Readings from Last 3 Encounters:  06/18/15 246 lb (111.585 kg)  01/10/15 242 lb (109.77 kg)  12/08/14 242 lb (109.77 kg)    Past Medical History  Diagnosis Date  . 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 Headache(784.0) 06/14/2007  . HYPERTENSION 06/14/2007    Social History   Social History  . Marital Status: Married    Spouse Name: N/A  . Number of Children: N/A  . Years of Education: N/A   Occupational History  . Not on file.   Social History Main Topics  . Smoking status: Never Smoker   . Smokeless tobacco: Never Used  . Alcohol Use: No  . Drug Use: No  . Sexual Activity: Not on file   Other Topics Concern  . Not on file   Social History Narrative    Past Surgical History  Procedure Laterality Date  . Dilation and curettage of uterus      x2  . Cesarean section      Family History  Problem Relation Age of Onset  . Diabetes Mother   . Hypertension Mother     No Known Allergies  Current Outpatient Prescriptions on File Prior to Visit  Medication Sig Dispense Refill  . HYDROcodone-acetaminophen (NORCO) 5-325 MG per tablet Take 1 tablet by mouth every 6 (six) hours as needed for moderate pain. 30 tablet 0  . metoprolol (LOPRESSOR) 50 MG tablet TAKE 1 TABLET BY MOUTH ONCE DAILY 90 tablet 1  . promethazine (PHENERGAN) 12.5 MG tablet Take 1 tablet (12.5 mg total) by  mouth every 8 (eight) hours as needed for nausea or vomiting. 30 tablet 0  . SUMAtriptan (IMITREX) 100 MG tablet TAKE 1 TABLET BY MOUTH EVERY 2 HOURS AS NEEDED FOR MIGRAINE 10 tablet 3  . topiramate (TOPAMAX) 50 MG tablet TAKE 1 TABLET BY MOUTH TWICE DAILY 120 tablet 3   No current facility-administered medications on file prior to visit.    BP 138/80 mmHg  Pulse 87  Temp(Src) 98.6 F (37 C) (Oral)  Resp 20  Ht 5' 3.5" (1.613 m)  Wt 246 lb (111.585 kg)  BMI 42.89 kg/m2  SpO2 98%      Review of Systems  Constitutional: Negative.   HENT: Negative for congestion, dental problem, hearing loss, rhinorrhea, sinus pressure, sore throat and tinnitus.   Eyes: Negative for pain, discharge and visual disturbance.  Respiratory: Negative for cough and shortness of breath.   Cardiovascular: Negative for chest pain, palpitations and leg swelling.  Gastrointestinal: Negative for nausea, vomiting, abdominal pain, diarrhea, constipation, blood in stool and abdominal distention.  Genitourinary: Negative for dysuria, urgency, frequency, hematuria, flank pain, vaginal bleeding, vaginal discharge, difficulty urinating, vaginal pain and pelvic pain.  Musculoskeletal: Negative for joint swelling, arthralgias and gait problem.  Skin: Negative for rash.  Neurological: Positive for headaches. Negative for dizziness, syncope, speech difficulty, weakness and  numbness.  Hematological: Negative for adenopathy.  Psychiatric/Behavioral: Negative for behavioral problems, dysphoric mood and agitation. The patient is not nervous/anxious.        Objective:   Physical Exam  Constitutional: She is oriented to person, place, and time. She appears well-developed and well-nourished.  Obese  HENT:  Head: Normocephalic.  Right Ear: External ear normal.  Left Ear: External ear normal.  Mouth/Throat: Oropharynx is clear and moist.  Eyes: Conjunctivae and EOM are normal. Pupils are equal, round, and reactive to  light.  Neck: Normal range of motion. Neck supple. Thyromegaly present.  Cardiovascular: Normal rate, regular rhythm, normal heart sounds and intact distal pulses.   Pulmonary/Chest: Effort normal and breath sounds normal.  Abdominal: Soft. Bowel sounds are normal. She exhibits no mass. There is no tenderness.  Musculoskeletal: Normal range of motion.  Lymphadenopathy:    She has no cervical adenopathy.  Neurological: She is alert and oriented to person, place, and time.  Skin: Skin is warm and dry. No rash noted.  Psychiatric: She has a normal mood and affect. Her behavior is normal.          Assessment & Plan:   Essential hypertension, controlled Chronic migraine headaches.  FMLA forms completed Obesity.  Weight loss encouraged Multinodular goiter, stable  Recheck 6 months

## 2015-06-21 ENCOUNTER — Other Ambulatory Visit: Payer: Self-pay | Admitting: Internal Medicine

## 2015-06-21 DIAGNOSIS — G43111 Migraine with aura, intractable, with status migrainosus: Secondary | ICD-10-CM

## 2015-06-21 MED ORDER — HYDROCODONE-ACETAMINOPHEN 5-325 MG PO TABS: 1.0000 | ORAL_TABLET | Freq: Four times a day (QID) | ORAL | Status: DC | PRN

## 2015-06-21 NOTE — Telephone Encounter (Signed)
Pt notified Rx ready for pickup. Rx printed and signed.  

## 2015-06-21 NOTE — Telephone Encounter (Signed)
ok 

## 2015-06-21 NOTE — Telephone Encounter (Signed)
Okay to refill Hydrocodone.  

## 2015-06-23 ENCOUNTER — Encounter: Payer: Self-pay | Admitting: Internal Medicine

## 2015-07-03 ENCOUNTER — Institutional Professional Consult (permissible substitution): Payer: 59 | Admitting: Physician Assistant

## 2015-07-23 ENCOUNTER — Encounter: Payer: 59 | Admitting: Obstetrics & Gynecology

## 2015-08-01 ENCOUNTER — Ambulatory Visit (INDEPENDENT_AMBULATORY_CARE_PROVIDER_SITE_OTHER): Payer: 59 | Admitting: Obstetrics & Gynecology

## 2015-08-01 ENCOUNTER — Other Ambulatory Visit: Payer: Self-pay

## 2015-08-01 ENCOUNTER — Encounter: Payer: Self-pay | Admitting: Obstetrics & Gynecology

## 2015-08-01 VITALS — BP 154/98 | HR 105 | Resp 18 | Ht 64.0 in | Wt 251.0 lb

## 2015-08-01 DIAGNOSIS — Z0000 Encounter for general adult medical examination without abnormal findings: Secondary | ICD-10-CM

## 2015-08-01 DIAGNOSIS — Z1151 Encounter for screening for human papillomavirus (HPV): Secondary | ICD-10-CM | POA: Diagnosis not present

## 2015-08-01 DIAGNOSIS — N920 Excessive and frequent menstruation with regular cycle: Secondary | ICD-10-CM

## 2015-08-01 DIAGNOSIS — Z1231 Encounter for screening mammogram for malignant neoplasm of breast: Secondary | ICD-10-CM

## 2015-08-01 DIAGNOSIS — Z124 Encounter for screening for malignant neoplasm of cervix: Secondary | ICD-10-CM

## 2015-08-01 DIAGNOSIS — Z01419 Encounter for gynecological examination (general) (routine) without abnormal findings: Secondary | ICD-10-CM | POA: Diagnosis not present

## 2015-08-01 NOTE — Progress Notes (Signed)
Subjective:    [name] is a 49 y.o. M AA P2 (31 and 19 yo kids) and she is raising a 85 yo niece female who presents for an annual exam. The patient has no complaints today. She had an 8 day period recently, ususally 5 days, always heavy. Also complains of 20 pound weight gain in the last year. She has a goiter.The patient is sexually active. GYN screening history: last pap: was normal. The patient wears seatbelts: yes. The patient participates in regular exercise: no. Has the patient ever been transfused or tattooed?: no. The patient reports that there is not domestic violence in her life.   Menstrual History: OB History    Gravida Para Term Preterm AB TAB SAB Ectopic Multiple Living   3    1  1   2       Menarche age: 4  Patient's last menstrual period was 07/20/2015.    The following portions of the patient's history were reviewed and updated as appropriate: allergies, current medications, past family history, past medical history, past social history, past surgical history and problem list.  Review of Systems Pertinent items noted in HPI and remainder of comprehensive ROS otherwise negative.  Married for 22 years, no contraception, denies dyspareunia, works at [employer], had flu vaccine   Objective:    BP 154/98 mmHg  Pulse 105  Resp 18  Ht 5\' 4"  (1.626 m)  Wt 251 lb (113.853 kg)  BMI 43.06 kg/m2  LMP 07/20/2015  General Appearance:    Alert, cooperative, no distress, appears stated age  Head:    Normocephalic, without obvious abnormality, atraumatic  Eyes:    PERRL, conjunctiva/corneas clear, EOM's intact, fundi    benign, both eyes  Ears:    Normal TM's and external ear canals, both ears  Nose:   Nares normal, septum midline, mucosa normal, no drainage    or sinus tenderness  Throat:   Lips, mucosa, and tongue normal; teeth and gums normal  Neck:   Supple, symmetrical, trachea midline, no adenopathy;    thyroid:  no enlargement/tenderness/nodules; no carotid  bruit or JVD  Back:     Symmetric, no curvature, ROM normal, no CVA tenderness  Lungs:     Clear to auscultation bilaterally, respirations unlabored  Chest Wall:    No tenderness or deformity   Heart:    Regular rate and rhythm, S1 and S2 normal, no murmur, rub   or gallop  Breast Exam:    No tenderness, masses, or nipple abnormality  Abdomen:     Soft, non-tender, bowel sounds active all four quadrants,    no masses, no organomegaly  Genitalia:    Normal female without lesion, discharge or tenderness, cervix very anteverted, no masses palpable     Extremities:   Extremities normal, atraumatic, no cyanosis or edema  Pulses:   2+ and symmetric all extremities  Skin:   Skin color, texture, turgor normal, no rashes or lesions  Lymph nodes:   Cervical, supraclavicular, and axillary nodes normal  Neurologic:   CNII-XII intact, normal strength, sensation and reflexes    throughout  .    Assessment:    Healthy female exam.   Menorrhagia Goiter Contraception- She is aware that she is potentially fertile.  Plan:     Breast self exam technique reviewed and patient encouraged to perform self-exam monthly. Mammogram. Thin prep Pap smear. with cotesting U/s of goiter U/s for menorrhagia

## 2015-08-03 LAB — CYTOLOGY - PAP

## 2015-08-06 ENCOUNTER — Ambulatory Visit
Admission: RE | Admit: 2015-08-06 | Discharge: 2015-08-06 | Disposition: A | Discharge status: Home / Self Care | Payer: 59 | Source: Ambulatory Visit | Attending: Obstetrics & Gynecology | Admitting: Obstetrics & Gynecology

## 2015-08-06 DIAGNOSIS — Z0000 Encounter for general adult medical examination without abnormal findings: Secondary | ICD-10-CM

## 2015-08-08 ENCOUNTER — Ambulatory Visit (HOSPITAL_COMMUNITY)
Admission: RE | Admit: 2015-08-08 | Discharge: 2015-08-08 | Disposition: A | Discharge status: Home / Self Care | Payer: 59 | Source: Ambulatory Visit | Attending: Obstetrics & Gynecology | Admitting: Obstetrics & Gynecology

## 2015-08-08 ENCOUNTER — Encounter: Payer: Self-pay | Admitting: Internal Medicine

## 2015-08-08 DIAGNOSIS — D252 Subserosal leiomyoma of uterus: Secondary | ICD-10-CM | POA: Insufficient documentation

## 2015-08-08 DIAGNOSIS — N920 Excessive and frequent menstruation with regular cycle: Secondary | ICD-10-CM | POA: Diagnosis not present

## 2015-08-08 DIAGNOSIS — N852 Hypertrophy of uterus: Secondary | ICD-10-CM | POA: Diagnosis not present

## 2015-08-10 ENCOUNTER — Telehealth: Payer: Self-pay | Admitting: *Deleted

## 2015-08-10 ENCOUNTER — Ambulatory Visit
Admission: RE | Admit: 2015-08-10 | Discharge: 2015-08-10 | Disposition: A | Discharge status: Home / Self Care | Payer: 59 | Source: Ambulatory Visit

## 2015-08-10 DIAGNOSIS — Z1231 Encounter for screening mammogram for malignant neoplasm of breast: Secondary | ICD-10-CM

## 2015-08-10 MED ORDER — DICLOFENAC SODIUM 75 MG PO TBEC: 75.0000 mg | DELAYED_RELEASE_TABLET | Freq: Two times a day (BID) | ORAL | Status: DC

## 2015-08-10 NOTE — Telephone Encounter (Signed)
-----   Message from Francia Greaves sent at 08/10/2015  9:10 AM EDT ----- Regarding: Questions about Appt Contact: [phone] Patient had a pelvic and vaginal ultrasound, having a lot of abdominal pain, cramping, nausea. Wants to know is this normal. Been taking Ibuprofen, not helping, wants to know if there is anything else she could take or do that might help.  [phone] number to reach her

## 2015-08-10 NOTE — Telephone Encounter (Signed)
Spoke to pt, informed her of [name] results. States she is having some abdominal cramping.  Sent Diclofenac to pharmacy per protocol.  Also recommended follow up with Dr Hulan Fray due to her experiencing these symptoms and noting uterine fibroids on [name].  Pt scheduled appt for 08-28-15 with Dr Hulan Fray to discuss options.

## 2015-08-17 ENCOUNTER — Inpatient Hospital Stay
Admission: RE | Admit: 2015-08-17 | Discharge: 2015-08-17 | Disposition: A | Discharge status: Home / Self Care | Payer: 59 | Source: Ambulatory Visit

## 2015-08-28 ENCOUNTER — Encounter: Payer: Self-pay | Admitting: Obstetrics & Gynecology

## 2015-08-28 ENCOUNTER — Ambulatory Visit (INDEPENDENT_AMBULATORY_CARE_PROVIDER_SITE_OTHER): Payer: 59 | Admitting: Obstetrics & Gynecology

## 2015-08-28 VITALS — BP 149/92 | HR 102 | Wt 248.0 lb

## 2015-08-28 DIAGNOSIS — D251 Intramural leiomyoma of uterus: Secondary | ICD-10-CM

## 2015-08-28 DIAGNOSIS — N920 Excessive and frequent menstruation with regular cycle: Secondary | ICD-10-CM | POA: Diagnosis not present

## 2015-08-28 DIAGNOSIS — E042 Nontoxic multinodular goiter: Secondary | ICD-10-CM

## 2015-08-28 DIAGNOSIS — E049 Nontoxic goiter, unspecified: Secondary | ICD-10-CM

## 2015-08-28 NOTE — Progress Notes (Signed)
   Subjective:    Patient ID: [name], female    DOB: 1966/04/21, 49 y.o.   MRN: [id number]  HPI 49 yo lady here to discuss her u/s results. I ordered a thyroid u/s as she thinks that her goiter has grown. I also ordered a gyn u/s as she is having DUB/menorrhagia.   Review of Systems     Objective:   Physical Exam [id number] Breathing, conversing, and ambulating normally Abd- benign, benign  Goiter on [name] is slightly larger Gyn u/s shows multiple fibroids       Assessment & Plan:  Goiter- refer to endocrinologist Fibroids- offered depo provera, Mirena, hysterecomy She will consider options and let me know

## 2015-09-12 ENCOUNTER — Encounter: Payer: Self-pay | Admitting: Endocrinology

## 2015-09-12 ENCOUNTER — Ambulatory Visit (INDEPENDENT_AMBULATORY_CARE_PROVIDER_SITE_OTHER): Payer: 59 | Admitting: Endocrinology

## 2015-09-12 VITALS — BP 128/88 | HR 111 | Temp 99.0°F | Resp 16 | Ht 64.0 in | Wt 246.6 lb

## 2015-09-12 DIAGNOSIS — E042 Nontoxic multinodular goiter: Secondary | ICD-10-CM | POA: Diagnosis not present

## 2015-09-12 NOTE — Progress Notes (Signed)
Patient ID: [name], female   DOB: 1966-08-25, 49 y.o.   MRN: [id number]           Reason for Appointment: Goiter, new consultation    History of Present Illness:   The patient's thyroid enlargement was first discovered in 03/2014 incidentally when the patient was at the urgent care center Her ultrasound showed a multinodular goiter and she did follow-up with her PCP  In the last few months patient feels more discomfort in her neck and she is more aware of her thyroid swelling.  She has more discomfort when she has a cold.  She is concerned about the discomfort and results of the swelling which she thinks may be getting bigger  She has had no difficulty with swallowing  Does not feel like she has any choking sensation in her neck or pressure in any position or when lying down.  Lab Results  Component Value Date   TSH 0.87 06/18/2015   TSH 1.790 04/15/2014   TSH 0.92 11/28/2013    She has had an ultrasound exam in 10/16 showed multiple nodules with the dominant solid nodule in the left isthmus measuring 2.8 cm, previously 2.6.  No suspicious features mentioned on the ultrasound and no lymphadenopathy seen  Other nodules were the same or minimally changed  Thyroid biopsy has not been done     Medication List       This list is accurate as of: 09/12/15 11:13 AM.  Always use your most recent med list.               diclofenac 75 MG EC tablet  Commonly known as:  VOLTAREN  Take 1 tablet (75 mg total) by mouth 2 (two) times daily.     HYDROcodone-acetaminophen 5-325 MG tablet  Commonly known as:  NORCO  Take 1 tablet by mouth every 6 (six) hours as needed for moderate pain.     metoprolol 50 MG tablet  Commonly known as:  LOPRESSOR  TAKE 1 TABLET BY MOUTH ONCE DAILY     promethazine 12.5 MG tablet  Commonly known as:  PHENERGAN  Take 1 tablet (12.5 mg total) by mouth every 8 (eight) hours as needed for nausea or vomiting.     SUMAtriptan 100 MG tablet  Commonly  known as:  IMITREX  TAKE 1 TABLET BY MOUTH EVERY 2 HOURS AS NEEDED FOR MIGRAINE     topiramate 50 MG tablet  Commonly known as:  TOPAMAX  TAKE 1 TABLET BY MOUTH TWICE DAILY        Allergies: No Known Allergies  Past Medical History  Diagnosis Date  . 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 Headache(784.0) 06/14/2007  . HYPERTENSION 06/14/2007    Past Surgical History  Procedure Laterality Date  . Dilation and curettage of uterus      x2  . Cesarean section      Family History  Problem Relation Age of Onset  . Diabetes Mother   . Hypertension Mother     Social History:  reports that she has never smoked. She has never used smokeless tobacco. She reports that she does not drink alcohol or use illicit drugs.   Review of Systems:  Review of Systems  Constitutional: Positive for malaise/fatigue.       She has had long-standing fatigue  HENT:       No difficulty swallowing  Respiratory: Negative for shortness of breath.   Cardiovascular: Negative for palpitations.  Gastrointestinal: Negative for nausea.  Neurological:  Negative for tremors.            Examination:   BP 128/88 mmHg  Pulse 111  Temp(Src) 99 F (37.2 C)  Resp 16  Ht 5\' 4"  (1.626 m)  Wt 246 lb 9.6 oz (111.857 kg)  BMI 42.31 kg/m2  SpO2 96%   General Appearance: pleasant, mildly generalized obesity present  Eyes: No abnormal prominence or eyelid swelling.          Neck: The thyroid is enlarged bilaterally.  She has a 3 cm soft a firm nodule in the midline which is mildly tender.  Lateral lobes are felt with difficulty but does not show any palpable nodules mind large about twice normal and rubbery in texture Neck circumference is  40 cm   over the thyroid There is no stridor. Pemberton sign is negative  There is no lymphadenopathy in the neck .    Cardiovascular: Normal  heart sounds, no murmur Respiratory:  Lungs clear Neurological: REFLEXES: at biceps are normal.  Skin: no rash         Assessment/Plan:  Multinodular goiter, probably long-standing She has a number of nodules throughout the thyroid but the largest one is in the isthmus which is visible and palpable She is mildly tender on the dominant nodule over the isthmus  She is having local discomfort but no objective signs of tracheal pressure or history of dysphagia Since her TSH is only 0.87 she is not a candidate for a trial of thyroid suppression, most likely will not be able to get adequate dose.  Also she is reluctant to take any medications  Discussed to the patient that the only options for treatment are surgery or observation only  She is not having enough local discomfort or pressure symptoms to warrant surgery at this time and she agrees to come back in 6 months for follow-up    [facility] 09/12/2015

## 2015-10-16 ENCOUNTER — Ambulatory Visit (INDEPENDENT_AMBULATORY_CARE_PROVIDER_SITE_OTHER): Payer: 59 | Admitting: Physician Assistant

## 2015-10-16 VITALS — BP 136/86 | HR 120 | Temp 98.8°F | Resp 18 | Ht 65.25 in | Wt 246.6 lb

## 2015-10-16 DIAGNOSIS — Z8669 Personal history of other diseases of the nervous system and sense organs: Secondary | ICD-10-CM | POA: Diagnosis not present

## 2015-10-16 DIAGNOSIS — G44219 Episodic tension-type headache, not intractable: Secondary | ICD-10-CM

## 2015-10-16 MED ORDER — CYCLOBENZAPRINE HCL 10 MG PO TABS: 5.0000 mg | ORAL_TABLET | Freq: Three times a day (TID) | ORAL | Status: DC | PRN

## 2015-10-16 MED ORDER — ACETAMINOPHEN 500 MG PO TABS
1000.0000 mg | ORAL_TABLET | Freq: Four times a day (QID) | ORAL | Status: DC | PRN
Start: 2015-10-16 — End: 2017-10-10

## 2015-10-16 MED ORDER — RIZATRIPTAN BENZOATE 5 MG PO TABS: 5.0000 mg | ORAL_TABLET | ORAL | Status: DC | PRN

## 2015-10-16 MED ORDER — KETOROLAC TROMETHAMINE 60 MG/2ML IM SOLN
60.0000 mg | Freq: Once | INTRAMUSCULAR | Status: AC
  Administered 2015-10-16: 60 mg via INTRAMUSCULAR

## 2015-10-16 NOTE — Progress Notes (Signed)
10/17/2015 12:15 AM   DOB: April 04, 1966 / MRN: [id number]  SUBJECTIVE:  [name] is a 49 y.o. female presenting for HA.  She has had migraines since 49 years of age.  Reports that she has had a migraine for the last 6 days and has never had a HA for this long.  Reports the HA is left sided, and is dull and constant in nature. She does report some nausea and diaphoresis, neck pain.   She denies vision changes.  She denies weakness. She has never seen a neurologist.    She has No Known Allergies.   She  has a past medical history of EXOGENOUS OBESITY (03/14/2010); [id number]) (06/14/2007); and HYPERTENSION (06/14/2007).    She  reports that she has never smoked. She has never used smokeless tobacco. She reports that she does not drink alcohol or use illicit drugs. She  reports that she currently engages in sexual activity. She reports using the following method of birth control/protection: None. The patient  has past surgical history that includes Dilation and curettage of uterus and Cesarean section.  Her family history includes Diabetes in her mother; Hypertension in her mother.  Review of Systems  Constitutional: Negative for fever and chills.  Eyes: Negative for blurred vision.  Respiratory: Negative for cough and shortness of breath.   Cardiovascular: Negative for chest pain.  Gastrointestinal: Negative for nausea and abdominal pain.  Genitourinary: Negative for dysuria, urgency and frequency.  Musculoskeletal: Negative for myalgias.  Skin: Negative for rash.  Neurological: Positive for headaches (occipital and frontal, non pulsatile). Negative for dizziness and tingling.  Psychiatric/Behavioral: Negative for depression. The patient is not nervous/anxious.     Problem list and medications reviewed and updated by myself where necessary, and exist elsewhere in the encounter.   OBJECTIVE:  BP 136/86 mmHg  Pulse 120  Temp(Src) 98.8 F (37.1 C) (Oral)  Resp 18  Ht 5' 5.25"  (1.657 m)  Wt 246 lb 9.6 oz (111.857 kg)  BMI 40.74 kg/m2  SpO2 97%  LMP 09/16/2015  Physical Exam  Constitutional: She is oriented to person, place, and time. She appears well-nourished. No distress.  Eyes: EOM are normal. Pupils are equal, round, and reactive to light.  Cardiovascular: Normal rate.   Pulmonary/Chest: Effort normal.  Abdominal: She exhibits no distension.  Neurological: She is alert and oriented to person, place, and time. She has normal reflexes. She displays no atrophy and no tremor. No cranial nerve deficit or sensory deficit. She exhibits normal muscle tone. She displays no seizure activity. Coordination and gait normal. GCS eye subscore is 4. GCS verbal subscore is 5. GCS motor subscore is 6.  RAM, [place], [place] intact to challenge.   Skin: Skin is dry. She is not diaphoretic.  Psychiatric: She has a normal mood and affect. Her speech is normal and behavior is normal. Judgment and thought content normal. Cognition and memory are normal.  Vitals reviewed.   No results found for this or any previous visit (from the past 48 hour(s)).  ASSESSMENT AND PLAN  Seren was seen today for migraine.  Diagnoses and all orders for this visit:  Episodic tension-type headache, not intractable: She has been suffering with HA for quite some time now and has never been seen by neuro.  Will send her for an evaluation and hopefully this will help.  Her HA today is most likely tension, as she denies pulsatility, nausea, and unilateral HA today.   -  cyclobenzaprine (FLEXERIL) 10 MG tablet; Take 0.5-1 tablets (5-10 mg total) by mouth 3 (three) times daily as needed for muscle spasms (May cause drowsiness. Do no operate heavy machinery while taking.). -     acetaminophen (TYLENOL) 500 MG tablet; Take 2 tablets (1,000 mg total) by mouth every 6 (six) hours as needed for headache. -     ketorolac (TORADOL) injection 60 mg; Inject 2 mLs (60 mg total) into the muscle once.  History  of migraine -     rizatriptan (MAXALT) 5 MG tablet; Take 1 tablet (5 mg total) by mouth as needed for migraine. May repeat in 2 hours if needed -     Ambulatory referral to Neurology    The patient was advised to call or return to clinic if she does not see an improvement in symptoms or to seek the care of the closest emergency department if she worsens with the above plan.   Philis Fendt, MHS, PA-C [facility] 10/17/2015 12:15 AM

## 2015-10-17 ENCOUNTER — Ambulatory Visit (INDEPENDENT_AMBULATORY_CARE_PROVIDER_SITE_OTHER): Payer: 59 | Admitting: Neurology

## 2015-10-17 ENCOUNTER — Encounter: Payer: Self-pay | Admitting: Neurology

## 2015-10-17 VITALS — BP 136/80 | HR 116 | Ht 63.0 in | Wt 248.0 lb

## 2015-10-17 DIAGNOSIS — G43019 Migraine without aura, intractable, without status migrainosus: Secondary | ICD-10-CM | POA: Diagnosis not present

## 2015-10-17 MED ORDER — NAPROXEN 500 MG PO TABS
ORAL_TABLET | ORAL | Status: DC
Start: 2015-10-17 — End: 2016-09-24

## 2015-10-17 MED ORDER — TOPIRAMATE ER 100 MG PO CAP24: 100.0000 mg | ORAL_CAPSULE | Freq: Every day | ORAL | Status: DC

## 2015-10-17 NOTE — Progress Notes (Signed)
NEUROLOGY CONSULTATION NOTE  [name] MRN: [id number] DOB: 1966/02/22  Referring provider: Philis Fendt, PA-C Primary care provider: Bluford Kaufmann, MD  Reason for consult:  migraine  HISTORY OF PRESENT ILLNESS: [name] is a 49 year old left-handed female with hypertension who presents for migraine.  History obtained by patient, her daughter, and PCP note.  Onset:  She has had migraines since age 88, however they have steadily gotten worse over the past year and have been daily since last week. Location:  Varies (either side, holocephalic).  Over the past week, she has had left sided neck pain Quality:  pounding Intensity:  10/10 Aura:  no Prodrome:  no Associated symptoms:  Nausea, photophobia, sometimes vomiting Duration:  Several hours to 2 days Frequency:  Usually 2-3 times a month but daily over past week. Triggers/exacerbating factors:  unknown Relieving factors:  Sleep, medication Activity:  Cannot function  Past abortive medication:  Tylenol, sumatriptan pill and NS, Relpax Past preventative medication:  Topiramate 50mg  twice daily (have taken it for past 2 years but stopped last week due to feeling it has been less effective and she continues to have paresthesias) Other past therapy:  none  Current abortive medication:  Usually ibuprofen 800mg , caffeine or hydrocodone.  Just prescribed sumatriptan 100mg  and Flexeril (has not taken yet).  Takes promethazine 12.5mg  for nausea. Antihypertensive medications:  metoprolol 50mg  Antidepressant medications:  none Anticonvulsant medications:  none Vitamins/Herbal/Supplements:  none Other medication:  Diclofenac 75mg  as needed for fibroid pain  Caffeine:  Coffee usually only for headache treatment Alcohol:  no Smoker:  no Diet:  No strict diet.  Drinks water Exercise:  no Depression/stress:  Increased stress and irritability over the past year since she gained custody of her 82 year old niece.  She has  lived with them since last January.  She has behavioral issues.   Sleep hygiene:  Variable.  Usually sleeps 4 to 6 hours.  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  She has not had rested sleep lately Family history of headache:  No  She has had daily headache for past week.  She received Toradol shot, which aborted severe headache but still has a dull lingering headache.  PAST MEDICAL HISTORY: Past Medical History  Diagnosis Date  . 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 Headache(784.0) 06/14/2007  . HYPERTENSION 06/14/2007    PAST SURGICAL HISTORY: Past Surgical History  Procedure Laterality Date  . Dilation and curettage of uterus      x2  . Cesarean section      MEDICATIONS: Current Outpatient Prescriptions on File Prior to Visit  Medication Sig Dispense Refill  . acetaminophen (TYLENOL) 500 MG tablet Take 2 tablets (1,000 mg total) by mouth every 6 (six) hours as needed for headache. 30 tablet 0  . cyclobenzaprine (FLEXERIL) 10 MG tablet Take 0.5-1 tablets (5-10 mg total) by mouth 3 (three) times daily as needed for muscle spasms (May cause drowsiness. Do no operate heavy machinery while taking.). 30 tablet 0  . diclofenac (VOLTAREN) 75 MG EC tablet Take 1 tablet (75 mg total) by mouth 2 (two) times daily. 60 tablet 0  . HYDROcodone-acetaminophen (NORCO) 5-325 MG per tablet Take 1 tablet by mouth every 6 (six) hours as needed for moderate pain. 30 tablet 0  . metoprolol (LOPRESSOR) 50 MG tablet TAKE 1 TABLET BY MOUTH ONCE DAILY 90 tablet 1  . promethazine (PHENERGAN) 12.5 MG tablet Take 1 tablet (12.5 mg total) by mouth every 8 (eight) hours as  needed for nausea or vomiting. 30 tablet 0  . SUMAtriptan (IMITREX) 100 MG tablet TAKE 1 TABLET BY MOUTH EVERY 2 HOURS AS NEEDED FOR MIGRAINE 10 tablet 3   No current facility-administered medications on file prior to visit.    ALLERGIES: No Known Allergies  FAMILY HISTORY: Family History  Problem Relation Age of Onset  . Diabetes Mother    . Hypertension Mother     SOCIAL HISTORY: Social History   Social History  . Marital Status: Married    Spouse Name: N/A  . Number of Children: N/A  . Years of Education: N/A   Occupational History  . Not on file.   Social History Main Topics  . Smoking status: Never Smoker   . Smokeless tobacco: Never Used  . Alcohol Use: No  . Drug Use: No  . Sexual Activity: Yes    Birth Control/ Protection: None   Other Topics Concern  . Not on file   Social History Narrative    REVIEW OF SYSTEMS: Constitutional: No fevers, chills, or sweats, no generalized fatigue, change in appetite Eyes: No visual changes, double vision, eye pain Ear, nose and throat: No hearing loss, ear pain, nasal congestion, sore throat Cardiovascular: No chest pain, palpitations Respiratory:  No shortness of breath at rest or with exertion, wheezes GastrointestinaI: No nausea, vomiting, diarrhea, abdominal pain, fecal incontinence Genitourinary:  No dysuria, urinary retention or frequency Musculoskeletal:  Neck pain Integumentary: No rash, pruritus, skin lesions Neurological: as above Psychiatric: stress Endocrine: No palpitations, fatigue, diaphoresis, mood swings, change in appetite, change in weight, increased thirst Hematologic/Lymphatic:  No anemia, purpura, petechiae. Allergic/Immunologic: no itchy/runny eyes, nasal congestion, recent allergic reactions, rashes  PHYSICAL EXAM: Filed Vitals:   10/17/15 1009  BP: 136/80  Pulse: 116   General: No acute distress.  Patient appears well-groomed.  Head:  Normocephalic/atraumatic Eyes:  fundi unremarkable, without vessel changes, exudates, hemorrhages or papilledema. Neck: supple, left sided tenderness, full range of motion Back: No paraspinal tenderness Heart: regular rate and rhythm Lungs: Clear to auscultation bilaterally. Vascular: No carotid bruits. Neurological Exam: Mental status: alert and oriented to person, place, and time, recent and  remote memory intact, fund of knowledge intact, attention and concentration intact, speech fluent and not dysarthric, language intact. Cranial nerves: CN I: not tested CN II: pupils equal, round and reactive to light, visual fields intact, fundi unremarkable, without vessel changes, exudates, hemorrhages or papilledema. CN III, IV, VI:  full range of motion, no nystagmus, no ptosis CN V: facial sensation intact CN VII: upper and lower face symmetric CN VIII: hearing intact CN IX, X: gag intact, uvula midline CN XI: sternocleidomastoid and trapezius muscles intact CN XII: tongue midline Bulk & Tone: normal, no fasciculations. Motor:  5/5 throughout  Sensation: temperature and vibration sensation intact. Deep Tendon Reflexes:  2+ throughout, toes downgoing.  Finger to nose testing:  Without dysmetria.  Heel to shin:  Without dysmetria.  Gait:  Normal station and stride.  Able to turn and tandem walk. Romberg negative.  IMPRESSION: Migraine without aura.  Worsening over the past several months, likely triggered by stress at home in caring for her 37 year old niece with behavioral issues.  PLAN: 1.  We discussed preventative medications.  She does not wish to try an antidepressant or beta blocker.  I suggested Trokendi XR 100mg , which may not have the paresthesia side effect.  She responded well to topiramate until recently.  She would like to work on lifestyle modification before considering  a different preventative medication. 2.  Sumatriptan 100mg  with naproxen 500mg  for abortive therapy 3.  Lifestyle modification (diary, medication overuse, proper sleep hygiene, routine exercise, improved diet and weight loss.  Advised to consider seeing a counselor or therapist to help her cope with home stressors). 4.  If headaches become severe and intractable again, consider prednisone taper 5.  Follow up in 3 months but she is to call in 4 weeks with update)  Thank you for allowing me to take part  in the care of this patient.  45 minutes spent face to face with patient, over 50% spent discussing management.  Metta Clines, DO  CC:  Amber Kaufmann, MD  Amber Fendt, PA-C

## 2015-10-17 NOTE — Patient Instructions (Signed)
Migraine Recommendations: 1.  Start Trokendi XR 10mg  at bedtime.  It is the extended release Topamax which may not cause the numbness.  Call in 4 weeks with update and we can adjust dose if needed. 2.  Take sumatriptan 100mg  with naproxen 500mg  at earliest onset of headache.  May repeat dose once in 2 hours if needed.  Do not exceed two doses in 24 hours. 3.  Limit use of pain relievers to no more than 2 days out of the week.  These medications include acetaminophen, ibuprofen, triptans and narcotics.  This will help reduce risk of rebound headaches.  I would not use the hydrocodone at all. 4.  Be aware of common food triggers such as processed sweets, processed foods with nitrites (such as deli meat, hot dogs, sausages), foods with MSG, alcohol (such as wine), chocolate, certain cheeses, certain fruits (dried fruits, some citrus fruit), vinegar, diet soda. 4.  Avoid caffeine 5.  Routine exercise 6.  Proper sleep hygiene 7.  Stay adequately hydrated with water 8.  Keep a headache diary. 9.  Maintain proper stress management. 10.  Do not skip meals. 11.  Consider supplements:  Magnesium oxide 400mg  to 600mg  daily, riboflavin 400mg , Coenzyme Q 10 100mg  three times daily 12.  Consider seeing a counselor to address stress 13.  If headaches get worse, we can prescribe a steroid taper. 14.  Follow up in 3 months but CALL IN 4 WEEKS

## 2015-10-18 NOTE — Progress Notes (Signed)
  Medical screening examination/treatment/procedure(s) were performed by non-physician practitioner and as supervising physician I was immediately available for consultation/collaboration.     

## 2015-10-31 MED FILL — TROKENDI XR 100 MG CAPSULE: 100 | 30 days supply | Qty: 30 | Fill #0

## 2015-10-31 MED FILL — NAPROXEN 500 MG TABLET: 500 | 10 days supply | Qty: 10 | Fill #0

## 2015-11-21 ENCOUNTER — Telehealth: Payer: Self-pay | Admitting: *Deleted

## 2015-11-21 NOTE — Telephone Encounter (Signed)
-----   Message from Francia Greaves sent at 11/19/2015 11:50 AM EST ----- Regarding: Rx Request Contact: [phone] Wants a prescription for birth control to help with fibroids, was recommended at her last appt and something for cramping current prescription can only be taken BID and is not helping  Uses [facility] out patient pharmacy on [address]

## 2015-11-21 NOTE — Telephone Encounter (Signed)
Called pt, no answer, left message to call office.

## 2015-11-29 DIAGNOSIS — Z7689 Persons encountering health services in other specified circumstances: Secondary | ICD-10-CM

## 2015-11-29 NOTE — Progress Notes (Signed)
   Subjective:    Patient ID: [name], female    DOB: 1966-06-19, 50 y.o.   MRN: [id number]  HPI    Review of Systems     Objective:   Physical Exam        Assessment & Plan:

## 2016-01-25 ENCOUNTER — Other Ambulatory Visit: Payer: Self-pay | Admitting: Internal Medicine

## 2016-01-25 MED FILL — SUMATRIPTAN SUCC 100 MG TAB: 100 | 30 days supply | Qty: 9 | Fill #0

## 2016-01-25 MED FILL — METOPROLOL TARTRATE 50 MG T: 50 | 90 days supply | Qty: 90 | Fill #0

## 2016-01-30 MED FILL — DIETHYLPROPION 75 MG TAB ER: 75 | 30 days supply | Qty: 30 | Fill #0

## 2016-02-05 ENCOUNTER — Telehealth: Payer: Self-pay

## 2016-02-05 NOTE — Telephone Encounter (Signed)
Received refill request from [facility] requesting a refill on Rizatriptan 5 mg, last refilled and picked up on 10/16/15. (previously refilled by Dr. Philis Fendt - [address] [place] [address] [place]: [id number]). Medication not on our list. Pt taking imitrex according to note and medication list. Mychart message sent to pt to see if the sumatriptan was not working, or why she was requesting refill on the rizatriptan. Will await her response.

## 2016-02-19 ENCOUNTER — Ambulatory Visit (INDEPENDENT_AMBULATORY_CARE_PROVIDER_SITE_OTHER): Payer: 59 | Admitting: Neurology

## 2016-02-19 ENCOUNTER — Encounter: Payer: Self-pay | Admitting: Neurology

## 2016-02-19 VITALS — BP 136/88 | HR 97 | Ht 64.0 in | Wt 237.0 lb

## 2016-02-19 DIAGNOSIS — G43009 Migraine without aura, not intractable, without status migrainosus: Secondary | ICD-10-CM | POA: Diagnosis not present

## 2016-02-19 DIAGNOSIS — G43111 Migraine with aura, intractable, with status migrainosus: Secondary | ICD-10-CM

## 2016-02-19 MED ORDER — TOPIRAMATE ER 50 MG PO CAP24: 150.0000 mg | ORAL_CAPSULE | Freq: Every day | ORAL | Status: DC

## 2016-02-19 MED ORDER — SUMATRIPTAN SUCCINATE 100 MG PO TABS: 100.0000 mg | ORAL_TABLET | Freq: Once | ORAL | Status: DC | PRN

## 2016-02-19 MED ORDER — PROMETHAZINE HCL 12.5 MG PO TABS: 12.5000 mg | ORAL_TABLET | Freq: Three times a day (TID) | ORAL | Status: DC | PRN

## 2016-02-19 MED FILL — PROMETHAZINE 12.5 MG TABLET: 12.5 | 10 days supply | Qty: 30 | Fill #0

## 2016-02-19 NOTE — Progress Notes (Signed)
NEUROLOGY FOLLOW UP OFFICE NOTE  [name] [id number]  HISTORY OF PRESENT ILLNESS: [name] is a 50 year old left-handed female with hypertension who follows up for migraines.    UPDATE: Intensity:  8/10 Duration:  Over 4 hours Frequency:  6 to 8 days per month Current NSAIDS:  naproxen 500mg  (partially effective), diclofenac 75mg  (for fibroid pain) Current analgesics:  Tylenol (partially effective) Current triptans:  sumatriptan 100mg  Current anti-emetic:  promethazine Current muscle relaxants:  Flexeril Current anti-anxiolytic:  no Current sleep aide:  no Antihypertensive medications:  metoprolol 50mg  Antidepressant medications:  none Anticonvulsant medications:  Trokendi XR 100mg  Vitamins/Herbal/Supplements:  Magnesium, riboflavin, coenzyme Q10  Caffeine:  Coffee usually only for headache treatment Alcohol:  no Smoker:  no Diet:  No strict diet.  Drinks water Exercise:  no Depression/stress:  Increased stress and irritability over the past year since she gained custody of her 50 year old niece.  She has lived with them since last January.  She has behavioral issues.   Sleep hygiene:  Variable.  Usually sleeps 4 to 6 hours.  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  She has not had rested sleep lately  HISTORY: Onset:  She has had migraines since age 35, however they have steadily gotten worse over the past year and have been daily since last week. Location:  Varies (either side, holocephalic).  Over the past week, she has had left sided neck pain Quality:  pounding Initial Intensity:  10/10 Aura:  no Prodrome:  no Associated symptoms:  Nausea, photophobia, sometimes vomiting Initial Duration:  Several hours to 2 days Initial Frequency:  Usually 2-3 times a month but daily over past week. Triggers/exacerbating factors:  unknown Relieving factors:  Sleep, medication Activity:  Cannot function  Past abortive medication:  Tylenol, sumatriptan NS, Relpax,  ibuprofen 800mg , caffeine or hydrocodone, rizatriptan Past preventative medication:  Topiramate 50mg  twice daily (paresthesias) Other past therapy:  none  Family history of headache:  No  PAST MEDICAL HISTORY: Past Medical History  Diagnosis Date  . 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 Headache(784.0) 06/14/2007  . HYPERTENSION 06/14/2007    MEDICATIONS: Current Outpatient Prescriptions on File Prior to Visit  Medication Sig Dispense Refill  . acetaminophen (TYLENOL) 500 MG tablet Take 2 tablets (1,000 mg total) by mouth every 6 (six) hours as needed for headache. 30 tablet 0  . cyclobenzaprine (FLEXERIL) 10 MG tablet Take 0.5-1 tablets (5-10 mg total) by mouth 3 (three) times daily as needed for muscle spasms (May cause drowsiness. Do no operate heavy machinery while taking.). 30 tablet 0  . diclofenac (VOLTAREN) 75 MG EC tablet Take 1 tablet (75 mg total) by mouth 2 (two) times daily. 60 tablet 0  . HYDROcodone-acetaminophen (NORCO) 5-325 MG per tablet Take 1 tablet by mouth every 6 (six) hours as needed for moderate pain. 30 tablet 0  . metoprolol (LOPRESSOR) 50 MG tablet TAKE 1 TABLET BY MOUTH ONCE DAILY 90 tablet 1  . naproxen (NAPROSYN) 500 MG tablet Take with sumatriptan 10 tablet 2  . SUMAtriptan (IMITREX) 100 MG tablet TAKE 1 TABLET BY MOUTH EVERY 2 HOURS AS NEEDED FOR MIGRAINE 10 tablet 3   No current facility-administered medications on file prior to visit.    ALLERGIES: No Known Allergies  FAMILY HISTORY: Family History  Problem Relation Age of Onset  . Diabetes Mother   . Hypertension Mother     SOCIAL HISTORY: Social History   Social History  . Marital Status: Married  Spouse Name: N/A  . Number of Children: N/A  . Years of Education: N/A   Occupational History  . Not on file.   Social History Main Topics  . Smoking status: Never Smoker   . Smokeless tobacco: Never Used  . Alcohol Use: No  . Drug Use: No  . Sexual Activity: Yes    Birth Control/  Protection: None   Other Topics Concern  . Not on file   Social History Narrative    REVIEW OF SYSTEMS: Constitutional: No fevers, chills, or sweats, no generalized fatigue, change in appetite Eyes: No visual changes, double vision, eye pain Ear, nose and throat: No hearing loss, ear pain, nasal congestion, sore throat Cardiovascular: No chest pain, palpitations Respiratory:  No shortness of breath at rest or with exertion, wheezes GastrointestinaI: No nausea, vomiting, diarrhea, abdominal pain, fecal incontinence Genitourinary:  No dysuria, urinary retention or frequency Musculoskeletal:  No neck pain, back pain Integumentary: No rash, pruritus, skin lesions Neurological: as above Psychiatric: No depression, insomnia, anxiety Endocrine: No palpitations, fatigue, diaphoresis, mood swings, change in appetite, change in weight, increased thirst Hematologic/Lymphatic:  No anemia, purpura, petechiae. Allergic/Immunologic: no itchy/runny eyes, nasal congestion, recent allergic reactions, rashes  PHYSICAL EXAM: Filed Vitals:   02/19/16 0758  BP: 136/88  Pulse: 97   General: No acute distress.  Patient appears well-groomed.   Head:  Normocephalic/atraumatic Eyes:  Fundi examined but not visualized Neck: supple, no paraspinal tenderness, full range of motion Heart:  Regular rate and rhythm Lungs:  Clear to auscultation bilaterally Back: No paraspinal tenderness Neurological Exam: alert and oriented to person, place, and time. Attention span and concentration intact, recent and remote memory intact, fund of knowledge intact.  Speech fluent and not dysarthric, language intact.  CN II-XII intact. Bulk and tone normal, muscle strength 5/5 throughout.  Sensation to light touch, temperature and vibration intact.  Deep tendon reflexes 2+ throughout.  Finger to nose and heel to shin testing intact.  Gait normal, Romberg negative.  IMPRESSION: Migraine without aura Morbid obesity (BMI greater  than 40)  PLAN: 1.  Increase Trokendi XR to 150mg  at bedtime.  Contact us in 4 weeks with update 2.  For abortive therapy, will try the sumatriptan 6mg  injection pen.  Promethazine 12.5mg  as needed for nausea. 3.  Continue magnesium, riboflavin, and coenzyme Q10 4.  Weight loss 5.  Follow up in 3 to 4 months.  15 minutes spent face to face with patient, over 50% spent discussing management.  Metta Clines, DO  CC:  Bluford Kaufmann, MD

## 2016-02-19 NOTE — Patient Instructions (Signed)
Migraine Recommendations: 1.  Increase Trokendi XR to 150mg  at bedtime.  I will prescribe you 50mg  pills so take 3 of those.  Call in 4 weeks with update and we can adjust dose if needed. 2.  Take sumatriptan 6mg  a injection at earliest onset of headache.  May repeat dose once in 2 hours if needed.  Do not exceed two injections in 24 hours. 3.  Limit use of pain relievers to no more than 2 days out of the week.  These medications include acetaminophen, ibuprofen, triptans and narcotics.  This will help reduce risk of rebound headaches. 4.  Be aware of common food triggers such as processed sweets, processed foods with nitrites (such as deli meat, hot dogs, sausages), foods with MSG, alcohol (such as wine), chocolate, certain cheeses, certain fruits (dried fruits, some citrus fruit), vinegar, diet soda. 4.  Avoid caffeine 5.  Routine exercise 6.  Proper sleep hygiene 7.  Stay adequately hydrated with water 8.  Keep a headache diary. 9.  Maintain proper stress management. 10.  Do not skip meals. 11.  Continue Magnesium oxide 400mg  to 600mg  daily, riboflavin 400mg , Coenzyme Q 10 100mg  three times daily 12.  CONTACT us IN 4 WEEKS WITH UPDATE AND WE CAN INCREASE TROKENDI TO 200MG  IF NEEDED.  Follow up in 3 to 4 months.

## 2016-02-20 MED FILL — TROKENDI XR 50 MG CAPSULE: 50 | 30 days supply | Qty: 90 | Fill #0

## 2016-03-11 ENCOUNTER — Ambulatory Visit (INDEPENDENT_AMBULATORY_CARE_PROVIDER_SITE_OTHER): Payer: 59 | Admitting: Endocrinology

## 2016-03-11 ENCOUNTER — Encounter: Payer: Self-pay | Admitting: Endocrinology

## 2016-03-11 VITALS — BP 124/78 | HR 98 | Temp 98.8°F | Resp 16 | Ht 64.0 in | Wt 235.8 lb

## 2016-03-11 DIAGNOSIS — E042 Nontoxic multinodular goiter: Secondary | ICD-10-CM

## 2016-03-11 LAB — T4, FREE: FREE T4: 0.72 ng/dL (ref 0.60–1.60)

## 2016-03-11 LAB — TSH: TSH: 1.24 u[IU]/mL (ref 0.35–4.50)

## 2016-03-11 NOTE — Progress Notes (Signed)
Patient ID: [name], female   DOB: 03-Jan-1966, 50 y.o.   MRN: [id number]           Reason for Appointment: Goiter, follow-up    History of Present Illness:   The patient's thyroid enlargement was first discovered in 03/2014 incidentally when the patient was at the urgent care center Her ultrasound showed a multinodular goiter   She was seen in initial consultation in 11/16 and was not very symptomatic at that time with some discomfort in her neck but no choking or swallowing difficulty  More recently she thinks her thyroid swelling which is mostly in the midline is getting bigger She is having some choking at times and occasionally difficulty swallowing However she is not waking on surgical intervention  Lab Results  Component Value Date   TSH 0.87 06/18/2015   TSH 1.790 04/15/2014   TSH 0.92 11/28/2013    She has had an ultrasound exam in 10/16 showed multiple nodules with the dominant solid nodule in the left isthmus measuring 2.8 cm, previously 2.6.  No suspicious features mentioned on the ultrasound and no lymphadenopathy seen  Other nodules were the same or minimally changed  Thyroid biopsy has not been done     Medication List       This list is accurate as of: 03/11/16  1:05 PM.  Always use your most recent med list.               acetaminophen 500 MG tablet  Commonly known as:  TYLENOL  Take 2 tablets (1,000 mg total) by mouth every 6 (six) hours as needed for headache.     cyclobenzaprine 10 MG tablet  Commonly known as:  FLEXERIL  Take 0.5-1 tablets (5-10 mg total) by mouth 3 (three) times daily as needed for muscle spasms (May cause drowsiness. Do no operate heavy machinery while taking.).     diclofenac 75 MG EC tablet  Commonly known as:  VOLTAREN  Take 1 tablet (75 mg total) by mouth 2 (two) times daily.     Diethylpropion HCl CR 75 MG Tb24     HYDROcodone-acetaminophen 5-325 MG tablet  Commonly known as:  NORCO  Take 1 tablet by mouth every  6 (six) hours as needed for moderate pain.     metoprolol 50 MG tablet  Commonly known as:  LOPRESSOR  TAKE 1 TABLET BY MOUTH ONCE DAILY     naproxen 500 MG tablet  Commonly known as:  NAPROSYN  Take with sumatriptan     promethazine 12.5 MG tablet  Commonly known as:  PHENERGAN  Take 1 tablet (12.5 mg total) by mouth every 8 (eight) hours as needed for nausea or vomiting.     SUMAtriptan 100 MG tablet  Commonly known as:  IMITREX  TAKE 1 TABLET BY MOUTH EVERY 2 HOURS AS NEEDED FOR MIGRAINE     Topiramate ER 50 MG Cp24  Commonly known as:  TROKENDI XR  Take 150 mg by mouth at bedtime.        Allergies: No Known Allergies  Past Medical History  Diagnosis Date  . 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 Headache(784.0) 06/14/2007  . HYPERTENSION 06/14/2007    Past Surgical History  Procedure Laterality Date  . Dilation and curettage of uterus      x2  . Cesarean section      Family History  Problem Relation Age of Onset  . Diabetes Mother   . Hypertension Mother     Social History:  reports that she has never smoked. She has never used smokeless tobacco. She reports that she does not drink alcohol or use illicit drugs.   Review of Systems:  Review of Systems  Constitutional: Positive for malaise/fatigue.       She has had long-standing fatigue  HENT:       No difficulty swallowing  Respiratory: Negative for shortness of breath.   Cardiovascular: Negative for palpitations.  Gastrointestinal: Negative for nausea.  Neurological: Negative for tremors.            Examination:   BP 124/78 mmHg  Pulse 98  Temp(Src) 98.8 F (37.1 C)  Resp 16  Ht 5\' 4"  (1.626 m)  Wt 235 lb 12.8 oz (106.958 kg)  BMI 40.45 kg/m2  SpO2 94%  Neck: The thyroid is enlarged bilaterally.   She has a Nearly 5 cm slightly firm nodule in the midline   Lateral lobes are just felt, firm Neck circumference is  41 cm   over the thyroid There is no stridor. Pemberton sign is negative  although she complains of choking when lifting her arms overhead   Neurological: REFLEXES: at biceps are normal.    Assessment/Plan:  Multinodular goiter, probably long-standing  The largest in the isthmus appears to be relatively bigger although the textures about the same Neck circumference is slightly better compared to the last visit She is having some more symptoms of choking and local pressure No stridor heard Thyroid levels recently are not available, previously normal   Currently she is not very keen on surgery However because of the increased size of her midline nodule will need to repeat her ultrasound and also thyroid functions    [facility] 03/11/2016

## 2016-03-21 ENCOUNTER — Other Ambulatory Visit: Payer: 59

## 2016-06-16 ENCOUNTER — Telehealth: Payer: Self-pay | Admitting: Neurology

## 2016-06-16 MED FILL — predniSONE 10 MG TABS: 10 | 6 days supply | Qty: 21 | Fill #0

## 2016-06-16 NOTE — Telephone Encounter (Signed)
She can come in for a toradol 30mg  injection or we can send her a prednisone one week taper for her pain.

## 2016-06-16 NOTE — Telephone Encounter (Signed)
Patient preferred the prednisone.  Rx called in.

## 2016-06-16 NOTE — Telephone Encounter (Signed)
[name] 12/02/2065. Her # is [phone] [id number]. She has had a migraine for 3 days now. She didn't know if there was anything she could take or do for it. Thank you

## 2016-06-16 NOTE — Telephone Encounter (Signed)
Please advise 

## 2016-06-24 ENCOUNTER — Ambulatory Visit (INDEPENDENT_AMBULATORY_CARE_PROVIDER_SITE_OTHER): Payer: 59 | Admitting: Neurology

## 2016-06-24 ENCOUNTER — Encounter: Payer: Self-pay | Admitting: Neurology

## 2016-06-24 ENCOUNTER — Ambulatory Visit
Admission: RE | Admit: 2016-06-24 | Discharge: 2016-06-24 | Disposition: A | Discharge status: Home / Self Care | Payer: 59 | Source: Ambulatory Visit | Attending: Endocrinology | Admitting: Endocrinology

## 2016-06-24 VITALS — BP 132/74 | HR 97 | Ht 64.0 in | Wt 243.0 lb

## 2016-06-24 DIAGNOSIS — E042 Nontoxic multinodular goiter: Secondary | ICD-10-CM | POA: Diagnosis not present

## 2016-06-24 DIAGNOSIS — G43009 Migraine without aura, not intractable, without status migrainosus: Secondary | ICD-10-CM

## 2016-06-24 MED ORDER — NORTRIPTYLINE HCL 10 MG PO CAPS: 10.0000 mg | ORAL_CAPSULE | Freq: Every day | ORAL | 3 refills | Status: DC

## 2016-06-24 MED ORDER — SUMATRIPTAN SUCCINATE 6 MG/0.5ML ~~LOC~~ SOLN: SUBCUTANEOUS | 3 refills | Status: DC

## 2016-06-24 MED FILL — NORTRIPTYLINE HCL 10 MG CAP: 10 | 20 days supply | Qty: 20 | Fill #0

## 2016-06-24 MED FILL — SUMATRIPTAN 6 MG/0.5 ML INJ: 6 | 15 days supply | Qty: 1 | Fill #0

## 2016-06-24 NOTE — Patient Instructions (Signed)
Migraine Recommendations: 1.  Start nortriptyline 10mg  at bedtime.  Call in 4 weeks with update and we can adjust dose if needed.  At the same time, we will taper off of Trokendi.  Take 2 pills daily for 3 days, then 1 pill daily for 4 days, then stop. 2.  Take sumatriptan 6mg  injection at earliest onset of headache.  May repeat dose once in 1 hour if needed.  Do not exceed two injections in 24 hours. 3.  Limit use of pain relievers to no more than 2 days out of the week.  These medications include acetaminophen, ibuprofen, triptans and narcotics.  This will help reduce risk of rebound headaches. 4.  Be aware of common food triggers such as processed sweets, processed foods with nitrites (such as deli meat, hot dogs, sausages), foods with MSG, alcohol (such as wine), chocolate, certain cheeses, certain fruits (dried fruits, some citrus fruit), vinegar, diet soda. 4.  Avoid caffeine 5.  Routine exercise 6.  Proper sleep hygiene 7.  Stay adequately hydrated with water 8.  Keep a headache diary. 9.  Maintain proper stress management. 10.  Do not skip meals. 11.  Consider supplements:  Magnesium oxide 400mg  to 600mg  daily, riboflavin 400mg , Coenzyme Q 10 100mg  three times daily 12.  Follow up in 3 months but call in 4 weeks with update.

## 2016-06-24 NOTE — Progress Notes (Signed)
NEUROLOGY FOLLOW UP OFFICE NOTE  RUMOR SIRMAN [id number]  HISTORY OF PRESENT ILLNESS: [name] is a 50 year old left-handed female with hypertension who follows up for migraines.     UPDATE: Intensity:  4/10 mild; 10/10 severe Duration:  Varies.  Lingers throughout day. Frequency:  2 to 3 days per week Current NSAIDS:  naproxen 500mg  (partially effective), diclofenac 75mg  (for fibroid pain) Current analgesics:  Tylenol (partially effective) Current triptans:  Sumatriptan 100mg .  Did try sumatriptan 6mg  [place] (effective but did not call for prescription). Current anti-emetic:  promethazine 12.5mg  Current muscle relaxants:  Flexeril Current anti-anxiolytic:  no Current sleep aide:  no Antihypertensive medications:  metoprolol 50mg  Antidepressant medications:  none Anticonvulsant medications:  Trokendi XR 150mg  Vitamins/Herbal/Supplements:  Magnesium, riboflavin, coenzyme Q10   Caffeine:  Coffee usually only for headache treatment Alcohol:  no Smoker:  no Diet:  No strict diet.  Drinks water Exercise:  no Depression/stress:  Increased stress and irritability over the past year since she gained custody of her 13 year old niece.  She has lived with them since last January.  She has behavioral issues.   Sleep hygiene:  Variable.  Usually sleeps 4 to 6 hours.  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  She has not had rested sleep lately   HISTORY: Onset:  She has had migraines since age 31, however they have steadily gotten worse over the past year and have been daily since last week. Location:  Varies (either side, holocephalic).  Over the past week, she has had left sided neck pain Quality:  pounding Initial Intensity:  10/10; May: 8/10 Aura:  no Prodrome:  no Associated symptoms:  Nausea, photophobia, sometimes vomiting Initial Duration:  Several hours to 2 days; May: over 4 hours Initial Frequency:  Usually 2-3 times a month; May: 6 to 8 days per  month Triggers/exacerbating factors:  unknown Relieving factors:  Sleep, medication Activity:  Cannot function   Past abortive medication:  Tylenol, sumatriptan NS, sumatriptan 100mg  tablet, Relpax, ibuprofen 800mg , caffeine or hydrocodone, rizatriptan Past preventative medication:  Topiramate 50mg  twice daily (paresthesias) Other past therapy:  none   Family history of headache:  No  PAST MEDICAL HISTORY: Past Medical History:  Diagnosis Date  . 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 Headache(784.0) 06/14/2007  . HYPERTENSION 06/14/2007    MEDICATIONS: Current Outpatient Prescriptions on File Prior to Visit  Medication Sig Dispense Refill  . acetaminophen (TYLENOL) 500 MG tablet Take 2 tablets (1,000 mg total) by mouth every 6 (six) hours as needed for headache. 30 tablet 0  . cyclobenzaprine (FLEXERIL) 10 MG tablet Take 0.5-1 tablets (5-10 mg total) by mouth 3 (three) times daily as needed for muscle spasms (May cause drowsiness. Do no operate heavy machinery while taking.). 30 tablet 0  . diclofenac (VOLTAREN) 75 MG EC tablet Take 1 tablet (75 mg total) by mouth 2 (two) times daily. 60 tablet 0  . Diethylpropion HCl CR 75 MG TB24   0  . metoprolol (LOPRESSOR) 50 MG tablet TAKE 1 TABLET BY MOUTH ONCE DAILY 90 tablet 1  . naproxen (NAPROSYN) 500 MG tablet Take with sumatriptan 10 tablet 2  . promethazine (PHENERGAN) 12.5 MG tablet Take 1 tablet (12.5 mg total) by mouth every 8 (eight) hours as needed for nausea or vomiting. 30 tablet 3  . HYDROcodone-acetaminophen (NORCO) 5-325 MG per tablet Take 1 tablet by mouth every 6 (six) hours as needed for moderate pain. (Patient not taking: Reported on 06/24/2016) 30 tablet  0   No current facility-administered medications on file prior to visit.     ALLERGIES: No Known Allergies  FAMILY HISTORY: Family History  Problem Relation Age of Onset  . Diabetes Mother   . Hypertension Mother     SOCIAL HISTORY: Social History   Social History  .  Marital status: Married    Spouse name: N/A  . Number of children: N/A  . Years of education: N/A   Occupational History  . Not on file.   Social History Main Topics  . Smoking status: Never Smoker  . Smokeless tobacco: Never Used  . Alcohol use No  . Drug use: No  . Sexual activity: Yes    Birth control/ protection: None   Other Topics Concern  . Not on file   Social History Narrative  . No narrative on file    REVIEW OF SYSTEMS: Constitutional: No fevers, chills, or sweats, no generalized fatigue, change in appetite Eyes: No visual changes, double vision, eye pain Ear, nose and throat: No hearing loss, ear pain, nasal congestion, sore throat Cardiovascular: No chest pain, palpitations Respiratory:  No shortness of breath at rest or with exertion, wheezes GastrointestinaI: No nausea, vomiting, diarrhea, abdominal pain, fecal incontinence Genitourinary:  No dysuria, urinary retention or frequency Musculoskeletal:  No neck pain, back pain Integumentary: No rash, pruritus, skin lesions Neurological: as above Psychiatric: No depression, insomnia, anxiety Endocrine: No palpitations, fatigue, diaphoresis, mood swings, change in appetite, change in weight, increased thirst Hematologic/Lymphatic:  No purpura, petechiae. Allergic/Immunologic: no itchy/runny eyes, nasal congestion, recent allergic reactions, rashes  PHYSICAL EXAM: Vitals:   06/24/16 0738  BP: 132/74  Pulse: 97   General: No acute distress.  Patient appears well-groomed.  . Head:  Normocephalic/atraumatic Eyes:  Fundi examined but not visualized Neck: supple, no paraspinal tenderness, full range of motion Heart:  Regular rate and rhythm Lungs:  Clear to auscultation bilaterally Back: No paraspinal tenderness Neurological Exam: alert and oriented to person, place, and time. Attention span and concentration intact, recent and remote memory intact, fund of knowledge intact.  Speech fluent and not dysarthric,  language intact.  CN II-XII intact. Bulk and tone normal, muscle strength 5/5 throughout.  Sensation to light touch intact.  Deep tendon reflexes 2+ throughout.  Finger to nose and heel to shin testing intact.  Gait normal, Romberg negative.  IMPRESSION: Migraine without aura  PLAN: 1.  Taper off Trokendi and start nortriptyline 10mg  at bedtime (will help with sleep as well) 2.  Refill sumatriptan 6mg  [place] 3.  Sleep hygiene information discussed and provided. 4.  Limit use of pain relievers to no more than 2 days out of the week 5.  Contact us in 4 weeks with update.  Follow up in 3 months.  25 minutes spent face to face with patient, over50% spent discussing management.  Metta Clines, DO  CC:  Hermine Messick, MD

## 2016-07-15 ENCOUNTER — Other Ambulatory Visit (INDEPENDENT_AMBULATORY_CARE_PROVIDER_SITE_OTHER): Payer: 59

## 2016-07-15 DIAGNOSIS — E042 Nontoxic multinodular goiter: Secondary | ICD-10-CM | POA: Diagnosis not present

## 2016-07-15 LAB — TSH: TSH: 2.09 u[IU]/mL (ref 0.35–4.50)

## 2016-07-15 LAB — T4, FREE: Free T4: 0.69 ng/dL (ref 0.60–1.60)

## 2016-07-18 ENCOUNTER — Encounter: Payer: Self-pay | Admitting: *Deleted

## 2016-07-18 ENCOUNTER — Ambulatory Visit (INDEPENDENT_AMBULATORY_CARE_PROVIDER_SITE_OTHER): Payer: 59 | Admitting: Endocrinology

## 2016-07-18 ENCOUNTER — Telehealth: Payer: Self-pay | Admitting: Endocrinology

## 2016-07-18 ENCOUNTER — Encounter: Payer: Self-pay | Admitting: Endocrinology

## 2016-07-18 VITALS — BP 132/86 | HR 96 | Resp 16 | Ht 64.0 in | Wt 247.2 lb

## 2016-07-18 DIAGNOSIS — Z23 Encounter for immunization: Secondary | ICD-10-CM | POA: Diagnosis not present

## 2016-07-18 DIAGNOSIS — E042 Nontoxic multinodular goiter: Secondary | ICD-10-CM

## 2016-07-18 NOTE — Telephone Encounter (Signed)
Letter is done.

## 2016-07-18 NOTE — Progress Notes (Signed)
Patient ID: [name], female   DOB: [birth year], 50 y.o.   MRN: [id number]           Reason for Appointment: Goiter, follow-up    History of Present Illness:   The patient's thyroid enlargement was first discovered in 03/2014 incidentally when the patient was at the urgent care center Her ultrasound showed a multinodular goiter   She was seen in initial consultation in 11/16 and was not very symptomatic at that time with some discomfort in her neck but no choking or swallowing difficulty  She is here for follow-up after her last visit in 5/17 She does not think she has seen any swelling in her thyroid She has only very occasional difficulty swallowing and minimal choking or pressure symptoms  Lab Results  Component Value Date   FREET4 0.69 07/15/2016   FREET4 0.72 03/11/2016   TSH 2.09 07/15/2016   TSH 1.24 03/11/2016   TSH 0.87 06/18/2015    She has had an ultrasound exam in 10/16 showed multiple nodules with the dominant solid nodule in the left isthmus measuring 2.8 cm, previously 2.6.    Other nodules were the same or minimally changed She has not had any biopsies   Right lobe: 5.1 x 1.5 x 2.1 cm, previously 5.8 x 1.6 x 2.3 cm  Nodule # 2:  Location: Right; Mid  Size: 1.2 x 1.2 x 1.4 cm, previously 1.3 x 1.1 x 1.2 cm  Composition: solid/almost completely solid (2)  Echogenicity: isoechoic (1)  ACR TI-RADS total points: 3.  Location: Left; Mid  Size: 2.1 x 1.5 x 1.8 cm, previously 2.0 x 0.7 x 0.8 cm  Composition: solid/almost completely solid (2)  Echogenicity: isoechoic (1)  ACR TI-RADS total points: 3.  Thyroid biopsy has not been done     Medication List       Accurate as of 07/18/16  8:22 AM. Always use your most recent med list.          acetaminophen 500 MG tablet Commonly known as:  TYLENOL Take 2 tablets (1,000 mg total) by mouth every 6 (six) hours as needed for headache.   diclofenac 75 MG EC tablet Commonly known as:   VOLTAREN Take 1 tablet (75 mg total) by mouth 2 (two) times daily.   Diethylpropion HCl CR 75 MG Tb24   metoprolol 50 MG tablet Commonly known as:  LOPRESSOR TAKE 1 TABLET BY MOUTH ONCE DAILY   naproxen 500 MG tablet Commonly known as:  NAPROSYN Take with sumatriptan   nortriptyline 10 MG capsule Commonly known as:  PAMELOR Take 1 capsule (10 mg total) by mouth at bedtime.   promethazine 12.5 MG tablet Commonly known as:  PHENERGAN Take 1 tablet (12.5 mg total) by mouth every 8 (eight) hours as needed for nausea or vomiting.   SUMAtriptan 6 MG/0.5ML Soln injection Commonly known as:  IMITREX Inject 0.5 mg into skin at earliest onset of headache. May repeat in 2 hours if headache persists or recurs.       Allergies: No Known Allergies  Past Medical History:  Diagnosis Date  . 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 Headache(784.0) 06/14/2007  . HYPERTENSION 06/14/2007    Past Surgical History:  Procedure Laterality Date  . CESAREAN SECTION    . DILATION AND CURETTAGE OF UTERUS     x2    Family History  Problem Relation Age of Onset  . Diabetes Mother   . Hypertension Mother     Social History:  reports  that she has never smoked. She has never used smokeless tobacco. She reports that she does not drink alcohol or use drugs.      ROS          Examination:   BP 132/86   Pulse 96   Resp 16   Ht 5\' 4"  (1.626 m)   Wt 247 lb 3.2 oz (112.1 kg)   SpO2 99%   BMI 42.43 kg/m   Neck: The thyroid is enlarged   She has About a 4 cm slightly firm nodule in the midline   Lateral lobes are just felt, firm Neck circumference is  41.2 cm   over the thyroid There is no stridor. Pemberton sign is negative with only minimal pressure sensation  Neurological: REFLEXES: at biceps are normal.    Assessment/Plan:  Multinodular goiter, Present since at least 2015  The largest nodule in the isthmus appears to be slightly smaller on ultrasound Left-sided nodule is increasing  in size and the need to biopsy.  Discussed biopsy and how this would be effective in diagnosing the enlarging nodule and will proceed    [facility] 07/18/2016  Note: This office note was prepared with Dragon voice recognition system technology. Any transcriptional errors that result from this process are unintentional.

## 2016-07-18 NOTE — Telephone Encounter (Signed)
Patient ask for a copy of her flu shot, she will be here before 5 to pick it up.

## 2016-07-24 ENCOUNTER — Encounter: Payer: Self-pay | Admitting: *Deleted

## 2016-07-24 ENCOUNTER — Other Ambulatory Visit (HOSPITAL_COMMUNITY)
Admission: RE | Admit: 2016-07-24 | Discharge: 2016-07-24 | Disposition: A | Discharge status: Home / Self Care | Payer: 59 | Source: Ambulatory Visit | Attending: General Surgery | Admitting: General Surgery

## 2016-07-24 ENCOUNTER — Ambulatory Visit
Admission: RE | Admit: 2016-07-24 | Discharge: 2016-07-24 | Disposition: A | Discharge status: Home / Self Care | Payer: 59 | Source: Ambulatory Visit | Attending: Endocrinology | Admitting: Endocrinology

## 2016-07-24 ENCOUNTER — Encounter: Payer: Self-pay | Admitting: Endocrinology

## 2016-07-24 DIAGNOSIS — E042 Nontoxic multinodular goiter: Secondary | ICD-10-CM

## 2016-07-24 DIAGNOSIS — D34 Benign neoplasm of thyroid gland: Secondary | ICD-10-CM | POA: Insufficient documentation

## 2016-07-24 DIAGNOSIS — E041 Nontoxic single thyroid nodule: Secondary | ICD-10-CM | POA: Diagnosis not present

## 2016-07-28 NOTE — Telephone Encounter (Signed)
Patient is returning your call for lab results. 

## 2016-07-28 NOTE — Progress Notes (Signed)
Please let patient know that the result is benign and no further action needed

## 2016-07-29 NOTE — Telephone Encounter (Signed)
Labs result given

## 2016-08-08 MED FILL — NORTRIPTYLINE HCL 10 MG CAP: 10 | 20 days supply | Qty: 20 | Fill #1

## 2016-08-08 MED FILL — SUMATRIPTAN SUCC 100 MG TAB: 100 | 30 days supply | Qty: 9 | Fill #1

## 2016-08-16 ENCOUNTER — Other Ambulatory Visit: Payer: Self-pay | Admitting: Obstetrics & Gynecology

## 2016-08-16 DIAGNOSIS — D259 Leiomyoma of uterus, unspecified: Secondary | ICD-10-CM

## 2016-08-18 MED ORDER — DICLOFENAC SODIUM 75 MG PO TBEC: 75.0000 mg | DELAYED_RELEASE_TABLET | Freq: Two times a day (BID) | ORAL | 1 refills | Status: DC

## 2016-08-18 MED FILL — DICLOFENAC SOD 75 MG TAB EC: 75 | 30 days supply | Qty: 60 | Fill #0

## 2016-08-18 NOTE — Telephone Encounter (Signed)
Pt called requesting refill on Diclofenac, sent refill to pharmacy and scheduled follow-up for 09-01-16.

## 2016-09-01 ENCOUNTER — Ambulatory Visit (INDEPENDENT_AMBULATORY_CARE_PROVIDER_SITE_OTHER): Payer: 59 | Admitting: Obstetrics & Gynecology

## 2016-09-01 ENCOUNTER — Encounter: Payer: Self-pay | Admitting: Obstetrics & Gynecology

## 2016-09-01 VITALS — BP 138/84 | HR 99 | Ht 64.0 in | Wt 249.0 lb

## 2016-09-01 DIAGNOSIS — Z01419 Encounter for gynecological examination (general) (routine) without abnormal findings: Secondary | ICD-10-CM

## 2016-09-01 DIAGNOSIS — Z1151 Encounter for screening for human papillomavirus (HPV): Secondary | ICD-10-CM

## 2016-09-01 DIAGNOSIS — Z124 Encounter for screening for malignant neoplasm of cervix: Secondary | ICD-10-CM | POA: Diagnosis not present

## 2016-09-01 MED ORDER — MISOPROSTOL 200 MCG PO TABS: ORAL_TABLET | ORAL | 0 refills | Status: DC

## 2016-09-01 MED FILL — miSOPROStol 200 MCG TABS: 200 | 1 days supply | Qty: 3 | Fill #0

## 2016-09-01 NOTE — Progress Notes (Signed)
Patient took lab orders with her to be drawn at work [employer]). Kathrene Alu RNBSN

## 2016-09-01 NOTE — Progress Notes (Signed)
Subjective:    [name] is a 50 y.o. MAA P2 (20 and 23) female who presents for an annual exam. She reports that her periods are very heavy, has to wear pad and tampon at the same time. Cramps are very bad. Periods last about 8 days, no contraception.  The patient is sexually active. GYN screening history: last pap: was normal. The patient wears seatbelts: yes. The patient participates in regular exercise: no. Has the patient ever been transfused or tattooed?: no. The patient reports that there is not domestic violence in her life.   Menstrual History: OB History    Gravida Para Term Preterm AB Living   3       1 2    SAB TAB Ectopic Multiple Live Births   1              Menarche age: 18 Patient's last menstrual period was 08/03/2016 (within days).    The following portions of the patient's history were reviewed and updated as appropriate: allergies, current medications, past family history, past medical history, past social history, past surgical history and problem list.  Review of Systems Pertinent items are noted in HPI.   Works at [employer] Had flu vaccine Married for 13 years Denies dyspareunia No FH of breast/gyn/colon cancer   Objective:    BP 138/84 (BP Location: Left Arm, Patient Position: Sitting)   Pulse 99   Ht 5\' 4"  (1.626 m)   Wt 249 lb (112.9 kg)   LMP 08/03/2016 (Within Days)   BMI 42.74 kg/m   General Appearance:    Alert, cooperative, no distress, appears stated age  Head:    Normocephalic, without obvious abnormality, atraumatic  Eyes:    PERRL, conjunctiva/corneas clear, EOM's intact, fundi    benign, both eyes  Ears:    Normal TM's and external ear canals, both ears  Nose:   Nares normal, septum midline, mucosa normal, no drainage    or sinus tenderness  Throat:   Lips, mucosa, and tongue normal; teeth and gums normal  Neck:   Supple, symmetrical, trachea midline, no adenopathy;    thyroid:  no enlargement/tenderness/nodules; no carotid   bruit or JVD   Back:     Symmetric, no curvature, ROM normal, no CVA tenderness  Lungs:     Clear to auscultation bilaterally, respirations unlabored  Chest Wall:    No tenderness or deformity   Heart:    Regular rate and rhythm, S1 and S2 normal, no murmur, rub   or gallop  Breast Exam:    No tenderness, masses, or nipple abnormality  Abdomen:     Soft, non-tender, bowel sounds active all four quadrants,    no masses, no organomegaly  Genitalia:    Normal female without lesion, discharge or tenderness     Extremities:   Extremities normal, atraumatic, no cyanosis or edema  Pulses:   2+ and symmetric all extremities  Skin:   Skin color, texture, turgor normal, no rashes or lesions  Lymph nodes:   Cervical, supraclavicular, and axillary nodes normal  Neurologic:   CNII-XII intact, normal strength, sensation and reflexes    throughout   .    Assessment:    Healthy female exam.   Heavy periods  Plan:     Thin prep Pap smear. with cotesting Plan for Mirena (pretreat with cytotec)

## 2016-09-02 ENCOUNTER — Other Ambulatory Visit: Payer: 59

## 2016-09-02 LAB — CYTOLOGY - PAP: DIAGNOSIS: NEGATIVE

## 2016-09-03 ENCOUNTER — Encounter: Payer: Self-pay | Admitting: Internal Medicine

## 2016-09-05 ENCOUNTER — Other Ambulatory Visit: Payer: 59

## 2016-09-05 ENCOUNTER — Ambulatory Visit: Payer: 59 | Admitting: Endocrinology

## 2016-09-05 DIAGNOSIS — Z01419 Encounter for gynecological examination (general) (routine) without abnormal findings: Secondary | ICD-10-CM | POA: Diagnosis not present

## 2016-09-05 LAB — CBC
HCT: 40.8 % (ref 35.0–45.0)
Hemoglobin: 12.9 g/dL (ref 11.7–15.5)
MCH: 25.2 pg — AB (ref 27.0–33.0)
MCHC: 31.6 g/dL — AB (ref 32.0–36.0)
MCV: 79.7 fL — ABNORMAL LOW (ref 80.0–100.0)
MPV: 9.8 fL (ref 7.5–12.5)
Platelets: 313 10*3/uL (ref 140–400)
RBC: 5.12 MIL/uL — AB (ref 3.80–5.10)
RDW: 14.9 % (ref 11.0–15.0)
WBC: 5.9 10*3/uL (ref 3.8–10.8)

## 2016-09-05 MED FILL — AMOXICILLIN 500 MG CAPSULE: 500 | 3 days supply | Qty: 9 | Fill #0

## 2016-09-05 MED FILL — HYDROCODON-APAP 5-325: 5-325 | 3 days supply | Qty: 25 | Fill #0

## 2016-09-05 MED FILL — IBUPROFEN 800 MG TABLET: 800 | 10 days supply | Qty: 30 | Fill #0

## 2016-09-06 LAB — LIPID PANEL
CHOL/HDL RATIO: 3.3 ratio (ref <5.0)
CHOLESTEROL: 133 mg/dL (ref <200)
HDL: 40 mg/dL — ABNORMAL LOW (ref >50)
LDL CALC: 72 mg/dL (ref <100)
TRIGLYCERIDES: 104 mg/dL (ref <150)
VLDL: 21 mg/dL (ref <30)

## 2016-09-06 LAB — COMPREHENSIVE METABOLIC PANEL
ALT: 14 U/L (ref 6–29)
AST: 17 U/L (ref 10–35)
Albumin: 3.7 g/dL (ref 3.6–5.1)
Alkaline Phosphatase: 84 U/L (ref 33–115)
BUN: 12 mg/dL (ref 7–25)
CALCIUM: 8.8 mg/dL (ref 8.6–10.2)
CHLORIDE: 107 mmol/L (ref 98–110)
CO2: 22 mmol/L (ref 20–31)
Creat: 0.73 mg/dL (ref 0.50–1.10)
GLUCOSE: 106 mg/dL — AB (ref 65–99)
POTASSIUM: 4.4 mmol/L (ref 3.5–5.3)
Sodium: 139 mmol/L (ref 135–146)
Total Bilirubin: 0.3 mg/dL (ref 0.2–1.2)
Total Protein: 6.5 g/dL (ref 6.1–8.1)

## 2016-09-06 LAB — VITAMIN D 25 HYDROXY (VIT D DEFICIENCY, FRACTURES): Vit D, 25-Hydroxy: 31 ng/mL (ref 30–100)

## 2016-09-06 LAB — TSH: TSH: 1.08 mIU/L

## 2016-09-15 ENCOUNTER — Ambulatory Visit: Payer: 59 | Admitting: Endocrinology

## 2016-09-17 ENCOUNTER — Telehealth: Payer: Self-pay | Admitting: *Deleted

## 2016-09-17 NOTE — Telephone Encounter (Signed)
Informed pt of lab results  

## 2016-09-22 ENCOUNTER — Encounter (INDEPENDENT_AMBULATORY_CARE_PROVIDER_SITE_OTHER): Payer: 59 | Admitting: Family Medicine

## 2016-09-24 ENCOUNTER — Encounter: Payer: Self-pay | Admitting: Neurology

## 2016-09-24 ENCOUNTER — Ambulatory Visit (INDEPENDENT_AMBULATORY_CARE_PROVIDER_SITE_OTHER): Payer: 59 | Admitting: Neurology

## 2016-09-24 VITALS — HR 102 | Ht 64.0 in | Wt 252.0 lb

## 2016-09-24 DIAGNOSIS — G43009 Migraine without aura, not intractable, without status migrainosus: Secondary | ICD-10-CM | POA: Diagnosis not present

## 2016-09-24 MED ORDER — NORTRIPTYLINE HCL 10 MG PO CAPS: 10.0000 mg | ORAL_CAPSULE | Freq: Every day | ORAL | 5 refills | Status: DC

## 2016-09-24 NOTE — Patient Instructions (Signed)
Migraine Recommendations: 1.  Continue nortriptyline 10mg  at bedtime 2.  Take sumatriptan 6mg  injection at earliest onset of headache.  May repeat dose once afte1 2 hour if needed.  Do not exceed two doses in 24 hours.  For mild headaches, you may use ibuprofen or other over the counter medication. 3.  Limit use of pain relievers to no more than 2 days out of the week.  These medications include acetaminophen, ibuprofen, triptans and narcotics.  This will help reduce risk of rebound headaches. 4.  Be aware of common food triggers such as processed sweets, processed foods with nitrites (such as deli meat, hot dogs, sausages), foods with MSG, alcohol (such as wine), chocolate, certain cheeses, certain fruits (dried fruits, some citrus fruit), vinegar, diet soda. 4.  Avoid caffeine 5.  Routine exercise 6.  Proper sleep hygiene 7.  Stay adequately hydrated with water 8.  Keep a headache diary. 9.  Maintain proper stress management. 10.  Do not skip meals. 11.  Consider supplements:  Magnesium citrate 400mg  to 600mg  daily, riboflavin 400mg , Coenzyme Q 10 100mg  three times daily 12.  Follow up in 6 months.   Migraine Headache A migraine headache is an intense, throbbing pain on one side or both sides of the head. Migraines may also cause other symptoms, such as nausea, vomiting, and sensitivity to light and noise. What are the causes? Doing or taking certain things may also trigger migraines, such as:  Alcohol.  Smoking.  Medicines, such as:  Medicine used to treat chest pain (nitroglycerine).  Birth control pills.  Estrogen pills.  Certain blood pressure medicines.  Aged cheeses, chocolate, or caffeine.  Foods or drinks that contain nitrates, glutamate, aspartame, or tyramine.  Physical activity. Other things that may trigger a migraine include:  Menstruation.  Pregnancy.  Hunger.  Stress, lack of sleep, too much sleep, or fatigue.  Weather changes. What increases the  risk? The following factors may make you more likely to experience migraine headaches:  Age. Risk increases with age.  Family history of migraine headaches.  Being Caucasian.  Depression and anxiety.  Obesity.  Being a woman.  Having a hole in the heart (patent foramen ovale) or other heart problems. What are the signs or symptoms? The main symptom of this condition is pulsating or throbbing pain. Pain may:  Happen in any area of the head, such as on one side or both sides.  Interfere with daily activities.  Get worse with physical activity.  Get worse with exposure to bright lights or loud noises. Other symptoms may include:  Nausea.  Vomiting.  Dizziness.  General sensitivity to bright lights, loud noises, or smells. Before you get a migraine, you may get warning signs that a migraine is developing (aura). An aura may include:  Seeing flashing lights or having blind spots.  Seeing bright spots, halos, or zigzag lines.  Having tunnel vision or blurred vision.  Having numbness or a tingling feeling.  Having trouble talking.  Having muscle weakness. How is this diagnosed? A migraine headache can be diagnosed based on:  Your symptoms.  A physical exam.  Tests, such as CT scan or MRI of the head. These imaging tests can help rule out other causes of headaches.  Taking fluid from the spine (lumbar puncture) and analyzing it (cerebrospinal fluid analysis, or CSF analysis). How is this treated? A migraine headache is usually treated with medicines that:  Relieve pain.  Relieve nausea.  Prevent migraines from coming back. Treatment may also  include:  Acupuncture.  Lifestyle changes like avoiding foods that trigger migraines. Follow these instructions at home: Medicines  Take over-the-counter and prescription medicines only as told by your health care provider.  Do not drive or use heavy machinery while taking prescription pain medicine.  To  prevent or treat constipation while you are taking prescription pain medicine, your health care provider may recommend that you:  Drink enough fluid to keep your urine clear or pale yellow.  Take over-the-counter or prescription medicines.  Eat foods that are high in fiber, such as fresh fruits and vegetables, whole grains, and beans.  Limit foods that are high in fat and processed sugars, such as fried and sweet foods. Lifestyle  Avoid alcohol use.  Do not use any products that contain nicotine or tobacco, such as cigarettes and e-cigarettes. If you need help quitting, ask your health care provider.  Get at least 8 hours of sleep every night.  Limit your stress. General instructions  Keep a journal to find out what may trigger your migraine headaches. For example, write down:  What you eat and drink.  How much sleep you get.  Any change to your diet or medicines.  If you have a migraine:  Avoid things that make your symptoms worse, such as bright lights.  It may help to lie down in a dark, quiet room.  Do not drive or use heavy machinery.  Ask your health care provider what activities are safe for you while you are experiencing symptoms.  Keep all follow-up visits as told by your health care provider. This is important. Contact a health care provider if:  You develop symptoms that are different or more severe than your usual migraine symptoms. Get help right away if:  Your migraine becomes severe.  You have a fever.  You have a stiff neck.  You have vision loss.  Your muscles feel weak or like you cannot control them.  You start to lose your balance often.  You develop trouble walking.  You faint. This information is not intended to replace advice given to you by your health care provider. Make sure you discuss any questions you have with your health care provider. Document Released: 10/06/2005 Document Revised: 04/25/2016 Document Reviewed:  03/24/2016 Elsevier Interactive Patient Education  2017 [employer].

## 2016-09-24 NOTE — Progress Notes (Signed)
NEUROLOGY FOLLOW UP OFFICE NOTE  [name] [id number]  HISTORY OF PRESENT ILLNESS: [name] is a 50 year old left-handed female with hypertension who follows up for migraines.     UPDATE: Improved. Intensity:  4/10 mild, 10/10 severe Duration:  Within one hour Frequency:  4 headaches a month (1 headache severe) Current NSAIDS:  Ibuprofen (effective for mild), diclofenac 75mg  (for fibroid pain) Current analgesics:  Tylenol (partially effective) Current triptans:  sumatriptan 6mg  [place] (effective) Current anti-emetic:  promethazine 12.5mg  Current muscle relaxants:  Flexeril Current anti-anxiolytic:  no Current sleep aide:  no Antihypertensive medications:  metoprolol 50mg  Antidepressant medications:  nortriptyline 10mg  Anticonvulsant medications:  no Vitamins/Herbal/Supplements:  Magnesium, riboflavin, coenzyme Q10   Caffeine:  Coffee usually only for headache treatment Alcohol:  no Smoker:  no Diet:  Signed up for weight loss clinic.  Drinks water Exercise:  no Depression/stress:  Better.  Cousin moved out.   Sleep hygiene:  Variable.  Usually sleeps 4 to 6 hours.  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  She has not had rested sleep lately   HISTORY: Onset:  She has had migraines since age 72, however they have steadily gotten worse over the past year and have been daily since last week. Location:  Varies (either side, holocephalic).  Over the past week, she has had left sided neck pain Quality:  pounding Initial Intensity:  10/10; September: 4/10 mild (10/10 severe) Aura:  no Prodrome:  no Associated symptoms:  Nausea, photophobia, sometimes vomiting Initial Duration:  Several hours to 2 days; September: varies (lingers throughout the day) Initial Frequency:  Usually 2-3 times a month; September: 2 to 3 days per week Triggers/exacerbating factors:  unknown Relieving factors:  Sleep, medication Activity:  Cannot function   Past abortive medication:   Tylenol, sumatriptan NS, sumatriptan 100mg  tablet, Relpax, ibuprofen 800mg , caffeine or hydrocodone, rizatriptan Past preventative medication:  Topiramate 50mg  twice daily (paresthesias), Trokendi XR Other past therapy:  none   Family history of headache:  No  PAST MEDICAL HISTORY: Past Medical History:  Diagnosis Date  . 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 Headache(784.0) 06/14/2007  . HYPERTENSION 06/14/2007    MEDICATIONS: Current Outpatient Prescriptions on File Prior to Visit  Medication Sig Dispense Refill  . acetaminophen (TYLENOL) 500 MG tablet Take 2 tablets (1,000 mg total) by mouth every 6 (six) hours as needed for headache. 30 tablet 0  . metoprolol (LOPRESSOR) 50 MG tablet TAKE 1 TABLET BY MOUTH ONCE DAILY 90 tablet 1  . nortriptyline (PAMELOR) 10 MG capsule Take 1 capsule (10 mg total) by mouth at bedtime. 20 capsule 3  . promethazine (PHENERGAN) 12.5 MG tablet Take 1 tablet (12.5 mg total) by mouth every 8 (eight) hours as needed for nausea or vomiting. 30 tablet 3  . SUMAtriptan (IMITREX) 6 MG/0.5ML SOLN injection Inject 0.5 mg into skin at earliest onset of headache. May repeat in 2 hours if headache persists or recurs. 4.5 mL 3   No current facility-administered medications on file prior to visit.     ALLERGIES: No Known Allergies  FAMILY HISTORY: Family History  Problem Relation Age of Onset  . Diabetes Mother   . Hypertension Mother     SOCIAL HISTORY: Social History   Social History  . Marital status: Married    Spouse name: N/A  . Number of children: N/A  . Years of education: N/A   Occupational History  . Not on file.   Social History Main Topics  . Smoking  status: Never Smoker  . Smokeless tobacco: Never Used  . Alcohol use No  . Drug use: No  . Sexual activity: Yes    Birth control/ protection: None   Other Topics Concern  . Not on file   Social History Narrative  . No narrative on file    REVIEW OF SYSTEMS: Constitutional: No fevers,  chills, or sweats, no generalized fatigue, change in appetite Eyes: No visual changes, double vision, eye pain Ear, nose and throat: No hearing loss, ear pain, nasal congestion, sore throat Cardiovascular: No chest pain, palpitations Respiratory:  No shortness of breath at rest or with exertion, wheezes GastrointestinaI: No nausea, vomiting, diarrhea, abdominal pain, fecal incontinence Genitourinary:  No dysuria, urinary retention or frequency Musculoskeletal:  No neck pain, back pain Integumentary: No rash, pruritus, skin lesions Neurological: as above Psychiatric: No depression, insomnia, anxiety Endocrine: No palpitations, fatigue, diaphoresis, mood swings, change in appetite, change in weight, increased thirst Hematologic/Lymphatic:  No purpura, petechiae. Allergic/Immunologic: no itchy/runny eyes, nasal congestion, recent allergic reactions, rashes  PHYSICAL EXAM: Vitals:   09/24/16 0739  Pulse: (!) 102   General: No acute distress.  Patient appears well-groomed.  Morbidly obese body habitus. Head:  Normocephalic/atraumatic  IMPRESSION: Migraine without aura Morbid obesity  PLAN: 1.  Continue nortriptyline 10mg  at bedtime 2.  OTC for mild headache, sumatriptan [place] for severe migraine 3.  Weight loss, supplements, water 4.  Follow up in 6 months or as needed.  15 minutes spent face to face with patient, 100% spent discussing headache protocol and lifestyle modification.  Metta Clines, DO  CC:  Bluford Kaufmann, MD

## 2016-10-03 MED FILL — NORTRIPTYLINE HCL 10 MG CAP: 10 | 90 days supply | Qty: 90 | Fill #0

## 2016-10-06 ENCOUNTER — Other Ambulatory Visit: Payer: Self-pay | Admitting: Endocrinology

## 2016-10-06 ENCOUNTER — Encounter (INDEPENDENT_AMBULATORY_CARE_PROVIDER_SITE_OTHER): Payer: Self-pay | Admitting: Family Medicine

## 2016-10-06 ENCOUNTER — Other Ambulatory Visit (INDEPENDENT_AMBULATORY_CARE_PROVIDER_SITE_OTHER): Payer: 59

## 2016-10-06 ENCOUNTER — Telehealth: Payer: Self-pay | Admitting: Endocrinology

## 2016-10-06 ENCOUNTER — Ambulatory Visit (INDEPENDENT_AMBULATORY_CARE_PROVIDER_SITE_OTHER): Payer: 59 | Admitting: Family Medicine

## 2016-10-06 VITALS — BP 127/80 | HR 83 | Temp 98.6°F | Resp 14 | Ht 64.0 in | Wt 249.0 lb

## 2016-10-06 DIAGNOSIS — R5383 Other fatigue: Secondary | ICD-10-CM

## 2016-10-06 DIAGNOSIS — E559 Vitamin D deficiency, unspecified: Secondary | ICD-10-CM

## 2016-10-06 DIAGNOSIS — E042 Nontoxic multinodular goiter: Secondary | ICD-10-CM

## 2016-10-06 DIAGNOSIS — Z1389 Encounter for screening for other disorder: Secondary | ICD-10-CM

## 2016-10-06 DIAGNOSIS — I10 Essential (primary) hypertension: Secondary | ICD-10-CM

## 2016-10-06 DIAGNOSIS — Z0289 Encounter for other administrative examinations: Secondary | ICD-10-CM

## 2016-10-06 DIAGNOSIS — R739 Hyperglycemia, unspecified: Secondary | ICD-10-CM

## 2016-10-06 DIAGNOSIS — Z9189 Other specified personal risk factors, not elsewhere classified: Secondary | ICD-10-CM

## 2016-10-06 DIAGNOSIS — Z833 Family history of diabetes mellitus: Secondary | ICD-10-CM | POA: Diagnosis not present

## 2016-10-06 DIAGNOSIS — R0609 Other forms of dyspnea: Secondary | ICD-10-CM

## 2016-10-06 LAB — TSH: TSH: 2.01 u[IU]/mL (ref 0.35–4.50)

## 2016-10-06 LAB — T4, FREE: Free T4: 0.72 ng/dL (ref 0.60–1.60)

## 2016-10-06 NOTE — Telephone Encounter (Signed)
Marcie Bal from [facility] called stated weight management would like to know what lab orders are. [phone]

## 2016-10-06 NOTE — Progress Notes (Signed)
Office: [phone]  /  Fax: [phone]   HPI:   Chief Complaint: OBESITY  [name] (MR# [id number]) is a 50 y.o. female who presents on 10/15/2016 for obesity evaluation and treatment. Current BMI is Body mass index is 42.74 kg/m.[name] has struggled with obesity for years and has been unsuccessful in either losing weight or maintaining long term weight loss. [name] attended our information session and states she is currently in the action stage of change and ready to dedicate time achieving and maintaining a healthier weight.  [name] states she frequently makes poor food choices she has problems with excessive hunger  she struggles with emotional eating   Fatigue  [name] feels her energy is lower than it should be. This has worsened with weight gain and has not worsened recently. [name] reports daytime somnolence and  reports waking up still tired.SHe works nights. Patient is at risk for obstructive sleep apnea. Patent has a history of symptoms of daytime fatigue. Patient generally gets 4 or 5 hours of sleep per night, and states they generally have difficulty falling back asleep if awakened. Snoring is present. Apneic episodes are not present. Epworth Sleepiness Score is 14  Dyspnea [name] notes increasing shortness of breath with exercising and seems to be worsening over time with weight gain. She notes getting out of breath sooner with activity than she used to. This has not gotten worse recently. [name] denies shortness of breath at rest or orthopnea.  Hyperglycemia Pt has a hx of elevated glucose levels but no A1c is found in Epic. She notes polyphagia.  Vit D deficiency [name] has a hx of vit D deficiency but no recent labs +fatigue.  Depression Screen [name]'s Food and Mood (modified PHQ-9) score was  Depression screen PHQ 2/9 10/06/2016  Decreased Interest 2  Down, Depressed, Hopeless 1  PHQ - 2 Score 3  Altered sleeping 0  Tired, decreased energy 2  Change  in appetite 1  Feeling bad or failure about yourself  1  Trouble concentrating 0  Moving slowly or fidgety/restless 0  Suicidal thoughts 0  PHQ-9 Score 7  Difficult doing work/chores Not difficult at all      ALLERGIES: No Known Allergies  MEDICATIONS: Current Outpatient Prescriptions on File Prior to Visit  Medication Sig Dispense Refill  . acetaminophen (TYLENOL) 500 MG tablet Take 2 tablets (1,000 mg total) by mouth every 6 (six) hours as needed for headache. 30 tablet 0  . metoprolol (LOPRESSOR) 50 MG tablet TAKE 1 TABLET BY MOUTH ONCE DAILY 90 tablet 1  . nortriptyline (PAMELOR) 10 MG capsule Take 1 capsule (10 mg total) by mouth at bedtime. 90 capsule 5  . promethazine (PHENERGAN) 12.5 MG tablet Take 1 tablet (12.5 mg total) by mouth every 8 (eight) hours as needed for nausea or vomiting. 30 tablet 3  . SUMAtriptan (IMITREX) 6 MG/0.5ML SOLN injection Inject 0.5 mg into skin at earliest onset of headache. May repeat in 2 hours if headache persists or recurs. 4.5 mL 3   No current facility-administered medications on file prior to visit.     PAST MEDICAL HISTORY: Past Medical History:  Diagnosis Date  . 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 Fibroids   . Headache(784.0) 06/14/2007  . High blood pressure   . HYPERTENSION 06/14/2007  . Migraines   . Nodular goiter   . Obesity   . Vitamin D deficiency     PAST SURGICAL HISTORY: Past Surgical History:  Procedure Laterality Date  . CESAREAN SECTION    .  DILATION AND CURETTAGE OF UTERUS     x2    SOCIAL HISTORY: Social History  Substance Use Topics  . Smoking status: Never Smoker  . Smokeless tobacco: Never Used  . Alcohol use No    FAMILY HISTORY: Family History  Problem Relation Age of Onset  . Diabetes Mother   . Hypertension Mother   . Thyroid disease Mother     ROS: Review of Systems  Constitutional: Positive for malaise/fatigue.       Trouble Sleeping  HENT:       Headache  Eyes:       Wears glasses or  contacts  Skin:       Neck Lumps    PHYSICAL EXAM: Blood pressure 127/80, pulse 83, temperature 98.6 F (37 C), temperature source Oral, resp. rate 14, height 5\' 4"  (1.626 m), weight 249 lb (112.9 kg), SpO2 100 %. Body mass index is 42.74 kg/m. Physical Exam  RECENT LABS AND TESTS: BMET    Component Value Date/Time   NA 140 10/06/2016 1428   K 4.5 10/06/2016 1428   CL 103 10/06/2016 1428   CO2 23 10/06/2016 1428   GLUCOSE 95 10/06/2016 1428   GLUCOSE 106 (H) 09/05/2016 0900   BUN 8 10/06/2016 1428   CREATININE 0.78 10/06/2016 1428   CREATININE 0.73 09/05/2016 0900   CALCIUM 8.9 10/06/2016 1428   GFRNONAA 90 10/06/2016 1428   GFRAA 103 10/06/2016 1428   Lab Results  Component Value Date   HGBA1C 6.0 (H) 10/06/2016   Lab Results  Component Value Date   INSULIN 44.8 (H) 10/06/2016   CBC    Component Value Date/Time   WBC 6.9 10/06/2016 1428   WBC 5.9 09/05/2016 0900   RBC 5.11 10/06/2016 1428   RBC 5.12 (H) 09/05/2016 0900   HGB 12.9 09/05/2016 0900   HCT 39.2 10/06/2016 1428   PLT 313 09/05/2016 0900   MCV 77 (L) 10/06/2016 1428   MCH 24.7 (L) 10/06/2016 1428   MCH 25.2 (L) 09/05/2016 0900   MCHC 32.1 10/06/2016 1428   MCHC 31.6 (L) 09/05/2016 0900   RDW 15.0 10/06/2016 1428   LYMPHSABS 2.1 10/06/2016 1428   MONOABS 0.5 06/18/2015 0953   EOSABS 0.1 10/06/2016 1428   BASOSABS 0.0 10/06/2016 1428   Iron/TIBC/Ferritin/ %Sat No results found for: IRON, TIBC, FERRITIN, IRONPCTSAT Lipid Panel     Component Value Date/Time   CHOL 130 10/06/2016 1428   TRIG 95 10/06/2016 1428   HDL 43 10/06/2016 1428   CHOLHDL 3.3 09/05/2016 0900   VLDL 21 09/05/2016 0900   LDLCALC 68 10/06/2016 1428   Hepatic Function Panel     Component Value Date/Time   PROT 6.5 10/06/2016 1428   ALBUMIN 3.7 10/06/2016 1428   AST 20 10/06/2016 1428   ALT 19 10/06/2016 1428   ALKPHOS 100 10/06/2016 1428   BILITOT <0.2 10/06/2016 1428   BILIDIR 0.0 11/28/2013 0857        Component Value Date/Time   TSH 2.01 10/06/2016 0904   TSH 1.08 09/05/2016 0900   TSH 2.09 07/15/2016 0804    ECG done today shows NSR @ 82 BPM INDIRECT CALORIMETER done today shows a VO2 of 288 and a REE of 2005.    ASSESSMENT AND PLAN: Fatigue, unspecified type - Plan: Comprehensive metabolic panel, CBC With Differential, Lipid Panel With LDL/HDL Ratio, VITAMIN D 25 Hydroxy (Vit-D Deficiency, Fractures), Vitamin B12, Folate, T3, T4, free, TSH  Dyspnea on exertion - Plan: EKG 12-Lead, Lipid Panel  With LDL/HDL Ratio, EKG 12-Lead  Hyperglycemia - Plan: Hemoglobin A1c, Insulin, random  Vitamin D deficiency  Essential hypertension  At risk for diabetes mellitus  Family history of diabetes mellitus (DM) - Plan: Hemoglobin A1c, Insulin, random  Morbid obesity ([name])  PLAN:  Fatigue [name] was informed that her fatigue may be related to obesity, depression or many other causes. Labs will be ordered, and in the meanwhile [name] has agreed to work on diet, exercise and weight loss to help with fatigue. Proper sleep hygiene was discussed including the need for 7-8 hours of quality sleep each night. A sleep study was not ordered based on symptoms and decreased sleep time  Exercise Induced Shortness of Breath [name]'s shortness of breath appears to be obesity related and exercise induced. She has agreed to work on weight loss and gradually increase exercise to treat her exercise induced shortness of breath. If [name] follows our instructions and loses weight without improvement of her shortness of breath, we will plan to refer to pulmonology. We will monitor this condition regularly. Kandice agrees to this plan.  Hyperglycemia Will check for Dm and pre-DM and follow  Vitamin D Deficiency [name] was informed that low vitamin D levels contributes to fatigue will check labs and follow.  Depression Screen [name] had a positive depression screening. Depression is commonly associated with  obesity and often results in emotional eating behaviors. We will monitor this closely and work on CBT to help improve the non-hunger eating patterns. Referral to Psychology may be required if no improvement is seen as she continues in our clinic.  Diabetes risk counselling [name] was given extended (at least 15 minutes) diabetes prevention counseling today. She is 50 y.o. female and has risk factors for diabetes including obesity. We discussed intensive lifestyle modifications today with an emphasis on weight loss as well as increasing exercise and decreasing simple carbohydrates in her diet.  Obesity [name] is currently in the action stage of change and her goal is to continue with weight loss efforts She has agreed to follow the Category 3 plan Akansha has been instructed to work up to a goal of 150 minutes of combined cardio and strengthening exercise per week for weight loss and overall health benefits. We discussed the following Behavioral Modification Stratagies today: increasing lean protein intake, decrease eating out and holiday eating strategies   [name] has agreed to follow up with our clinic in 2 weeks. She was informed of the importance of frequent follow up visits to maximize her success with intensive lifestyle modifications for her multiple health conditions. She was informed we would discuss her lab results at her next visit unless there is a critical issue that needs to be addressed sooner. [name] agreed to keep her next visit at the agreed upon time to discuss these results.

## 2016-10-07 LAB — LIPID PANEL WITH LDL/HDL RATIO
Cholesterol, Total: 130 mg/dL (ref 100–199)
HDL: 43 mg/dL (ref >39)
LDL CALC: 68 mg/dL (ref 0–99)
LDl/HDL Ratio: 1.6 ratio units (ref 0.0–3.2)
Triglycerides: 95 mg/dL (ref 0–149)
VLDL CHOLESTEROL CAL: 19 mg/dL (ref 5–40)

## 2016-10-07 LAB — VITAMIN B12: VITAMIN B 12: 542 pg/mL (ref 232–1245)

## 2016-10-07 LAB — CBC WITH DIFFERENTIAL
BASOS: 0 %
Basophils Absolute: 0 10*3/uL (ref 0.0–0.2)
EOS (ABSOLUTE): 0.1 10*3/uL (ref 0.0–0.4)
Eos: 2 %
Hematocrit: 39.2 % (ref 34.0–46.6)
Hemoglobin: 12.6 g/dL (ref 11.1–15.9)
Immature Grans (Abs): 0 10*3/uL (ref 0.0–0.1)
Immature Granulocytes: 0 %
Lymphocytes Absolute: 2.1 10*3/uL (ref 0.7–3.1)
Lymphs: 31 %
MCH: 24.7 pg — AB (ref 26.6–33.0)
MCHC: 32.1 g/dL (ref 31.5–35.7)
MCV: 77 fL — AB (ref 79–97)
MONOS ABS: 0.7 10*3/uL (ref 0.1–0.9)
Monocytes: 10 %
NEUTROS ABS: 3.9 10*3/uL (ref 1.4–7.0)
NEUTROS PCT: 57 %
RBC: 5.11 x10E6/uL (ref 3.77–5.28)
RDW: 15 % (ref 12.3–15.4)
WBC: 6.9 10*3/uL (ref 3.4–10.8)

## 2016-10-07 LAB — COMPREHENSIVE METABOLIC PANEL
A/G RATIO: 1.3 (ref 1.2–2.2)
ALBUMIN: 3.7 g/dL (ref 3.5–5.5)
ALK PHOS: 100 IU/L (ref 39–117)
ALT: 19 IU/L (ref 0–32)
AST: 20 IU/L (ref 0–40)
BUN / CREAT RATIO: 10 (ref 9–23)
BUN: 8 mg/dL (ref 6–24)
Bilirubin Total: <0.2 mg/dL (ref 0.0–1.2)
CO2: 23 mmol/L (ref 18–29)
CREATININE: 0.78 mg/dL (ref 0.57–1.00)
Calcium: 8.9 mg/dL (ref 8.7–10.2)
Chloride: 103 mmol/L (ref 96–106)
GFR calc Af Amer: 103 mL/min/{1.73_m2} (ref >59)
GFR, EST NON AFRICAN AMERICAN: 90 mL/min/{1.73_m2} (ref >59)
GLOBULIN, TOTAL: 2.8 g/dL (ref 1.5–4.5)
Glucose: 95 mg/dL (ref 65–99)
Potassium: 4.5 mmol/L (ref 3.5–5.2)
Sodium: 140 mmol/L (ref 134–144)
Total Protein: 6.5 g/dL (ref 6.0–8.5)

## 2016-10-07 LAB — HEMOGLOBIN A1C
Est. average glucose Bld gHb Est-mCnc: 126 mg/dL
Hgb A1c MFr Bld: 6 % — ABNORMAL HIGH (ref 4.8–5.6)

## 2016-10-07 LAB — INSULIN, RANDOM: INSULIN: 44.8 u[IU]/mL — ABNORMAL HIGH (ref 2.6–24.9)

## 2016-10-07 LAB — VITAMIN D 25 HYDROXY (VIT D DEFICIENCY, FRACTURES): VIT D 25 HYDROXY: 30.2 ng/mL (ref 30.0–100.0)

## 2016-10-07 LAB — FOLATE: Folate: 12.9 ng/mL (ref >3.0)

## 2016-10-08 ENCOUNTER — Ambulatory Visit (INDEPENDENT_AMBULATORY_CARE_PROVIDER_SITE_OTHER): Payer: 59 | Admitting: Endocrinology

## 2016-10-08 ENCOUNTER — Encounter: Payer: Self-pay | Admitting: Endocrinology

## 2016-10-08 VITALS — BP 136/86 | HR 95 | Ht 64.0 in | Wt 254.0 lb

## 2016-10-08 DIAGNOSIS — E042 Nontoxic multinodular goiter: Secondary | ICD-10-CM

## 2016-10-08 NOTE — Progress Notes (Signed)
Patient ID: [name], female   DOB: 21-Jun-1966, 50 y.o.   MRN: [id number]           Reason for Appointment: Goiter, follow-up    History of Present Illness:   The patient's thyroid enlargement was first discovered in 03/2014 incidentally when the patient was at the urgent care center Her ultrasound showed a multinodular goiter   She was seen in initial consultation in 11/16 and was not very symptomatic at that time with some discomfort in her neck but no choking or swallowing difficulty  Since she had an increased size of the isthmus nodule this was evaluated with needle aspiration in 07/2016 and showed benign follicular adenoma  She has come back for follow-up but is complaining about increasing pressure and choking sensation in her neck.  She thinks that at times she feels somebody pressing on her neck.  Also at times will have choking with swallowing and may need to drink more water to swallow.   Lab Results  Component Value Date   FREET4 0.72 10/06/2016   FREET4 0.69 07/15/2016   FREET4 0.72 03/11/2016   TSH 2.01 10/06/2016   TSH 1.08 09/05/2016   TSH 2.09 07/15/2016    She has had an ultrasound exam in 10/16 showed multiple nodules with the dominant solid nodule in the left isthmus measuring 2.8 cm, previously 2.6.    Other nodules were the same or minimally changed  Ultrasound report: Right lobe: 5.1 x 1.5 x 2.1 cm, previously 5.8 x 1.6 x 2.3 cm  Nodule # 2:  Location: Right; Mid  Size: 1.2 x 1.2 x 1.4 cm, previously 1.3 x 1.1 x 1.2 cm  Composition: solid/almost completely solid (2)  Echogenicity: isoechoic (1)  ACR TI-RADS total points: 3.  Location: Left; Mid  Size: 2.1 x 1.5 x 1.8 cm, previously 2.0 x 0.7 x 0.8 cm  Composition: solid/almost completely solid (2)  Echogenicity: isoechoic (1)  ACR TI-RADS total points: 3.  Thyroid biopsy has not been done   Allergies as of 10/08/2016   No Known Allergies     Medication List         Accurate as of 10/08/16 10:15 AM. Always use your most recent med list.          acetaminophen 500 MG tablet Commonly known as:  TYLENOL Take 2 tablets (1,000 mg total) by mouth every 6 (six) hours as needed for headache.   metoprolol 50 MG tablet Commonly known as:  LOPRESSOR TAKE 1 TABLET BY MOUTH ONCE DAILY   nortriptyline 10 MG capsule Commonly known as:  PAMELOR Take 1 capsule (10 mg total) by mouth at bedtime.   promethazine 12.5 MG tablet Commonly known as:  PHENERGAN Take 1 tablet (12.5 mg total) by mouth every 8 (eight) hours as needed for nausea or vomiting.   SUMAtriptan 6 MG/0.5ML Soln injection Commonly known as:  IMITREX Inject 0.5 mg into skin at earliest onset of headache. May repeat in 2 hours if headache persists or recurs.       Allergies: No Known Allergies  Past Medical History:  Diagnosis Date  . 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 Fibroids   . Headache(784.0) 06/14/2007  . High blood pressure   . HYPERTENSION 06/14/2007  . Migraines   . Nodular goiter   . Obesity   . Vitamin D deficiency     Past Surgical History:  Procedure Laterality Date  . CESAREAN SECTION    . DILATION AND CURETTAGE OF UTERUS  x2    Family History  Problem Relation Age of Onset  . Diabetes Mother   . Hypertension Mother   . Thyroid disease Mother     Social History:  reports that she has never smoked. She has never used smokeless tobacco. She reports that she does not drink alcohol or use drugs.      ROS       She has been evaluated in the bariatric clinic for weight loss    Examination:   BP 136/86   Pulse 95   Ht 5\' 4"  (1.626 m)   Wt 254 lb (115.2 kg)   SpO2 96%   BMI 43.60 kg/m    She has a 3-3.5 cm firm nodule in the midline  Lateral lobes are just Mildly enlarged mildly enlarged, firm Neck circumference is 40.5 cm   over the thyroid There is no stridor.  Pemberton sign is negative but patient experiences choking  sensation    Assessment/Plan:  Multinodular goiter, Present since at least 2015 Thyroid functions are normal  The largest nodule in the isthmus was benign on biopsy  Currently the patient is complaining about increasing pressure and choking sensation in her neck anteriorly Objectively do not see any change in the anterior thyroid nodule or the size of the goiter overall. No objective signs of tracheal compression We will evaluate her anatomy with a CT scan and refer for a surgical opinion at the patient's request    [facility] 10/08/2016  Note: This office note was prepared with Dragon voice recognition system technology. Any transcriptional errors that result from this process are unintentional.

## 2016-10-20 DIAGNOSIS — E039 Hypothyroidism, unspecified: Secondary | ICD-10-CM

## 2016-10-20 HISTORY — DX: Hypothyroidism, unspecified: E03.9

## 2016-10-22 ENCOUNTER — Ambulatory Visit (INDEPENDENT_AMBULATORY_CARE_PROVIDER_SITE_OTHER): Payer: 59 | Admitting: Family Medicine

## 2016-10-22 VITALS — BP 163/90 | Temp 98.0°F | Resp 16 | Ht 64.0 in | Wt 241.0 lb

## 2016-10-22 DIAGNOSIS — E559 Vitamin D deficiency, unspecified: Secondary | ICD-10-CM

## 2016-10-22 DIAGNOSIS — R7303 Prediabetes: Secondary | ICD-10-CM | POA: Diagnosis not present

## 2016-10-22 DIAGNOSIS — Z9189 Other specified personal risk factors, not elsewhere classified: Secondary | ICD-10-CM | POA: Diagnosis not present

## 2016-10-22 MED ORDER — METFORMIN HCL 500 MG PO TABS: 500.0000 mg | ORAL_TABLET | ORAL | 0 refills | Status: DC

## 2016-10-22 MED ORDER — VITAMIN D (ERGOCALCIFEROL) 1.25 MG (50000 UNIT) PO CAPS: 50000.0000 [IU] | ORAL_CAPSULE | ORAL | 0 refills | Status: DC

## 2016-10-22 MED FILL — metFORMIN HCL 500 MG TABS: 500 | 30 days supply | Qty: 30 | Fill #0

## 2016-10-22 MED FILL — VIT D2 1.25 MG (50,000 UNIT: 1.25 MG | 28 days supply | Qty: 4 | Fill #0

## 2016-10-22 NOTE — Progress Notes (Signed)
Office: [phone]  /  Fax: [phone]   HPI:   Chief Complaint: OBESITY [name] is here to discuss her progress with her obesity treatment plan. She is following her eating plan approximately 50 % of the time and states she is exercising zero minutes zero times per week. [name] did struggle with sticking to the plan during the holidays.  Her weight is 241 lb (109.3 kg) today and has had a weight loss of 8 pounds over a period of 2 weeks since her last visit. She has lost 8 lbs since starting treatment with [name]. Patient did well with weight loss even over christmas, she had some increase with celebration eating. She deviated somewhat from the plan and wants to start the [place] fast for the next three weeks.   Vitamin D deficiency [name] has a diagnosis of vitamin D deficiency. She is not currently taking vit D and denies nausea, vomiting or muscle weakness. Not yet at the goal. Positive for fatigue.   Pre-Diabetes [name] has a new diagnosis of prediabetes based on her elevated HgA1c and was informed this puts her at greater risk of developing diabetes. She is not taking metformin currently and continues to work on diet and exercise to decrease risk of diabetes. She denies nausea or hypoglycemia. Her A1C is 6.0 with a fasting insulin very high at 44 and positive for polyphagia.   At risk for diabetes [name] is at higher than averagerisk for developing diabetes due to her obesity. She currently denies polyuria or polydipsia.   Wt Readings from Last 500 Encounters:  10/22/16 241 lb (109.3 kg)  10/08/16 254 lb (115.2 kg)  10/06/16 249 lb (112.9 kg)  09/24/16 252 lb (114.3 kg)  09/01/16 249 lb (112.9 kg)  07/18/16 247 lb 3.2 oz (112.1 kg)  06/24/16 243 lb (110.2 kg)  03/11/16 235 lb 12.8 oz (107 kg)  02/19/16 237 lb (107.5 kg)  10/17/15 248 lb (112.5 kg)  10/16/15 246 lb 9.6 oz (111.9 kg)  09/12/15 246 lb 9.6 oz (111.9 kg)  08/28/15 248 lb (112.5 kg)  08/01/15 251 lb (113.9 kg)    06/18/15 246 lb (111.6 kg)  01/10/15 242 lb (109.8 kg)  12/08/14 242 lb (109.8 kg)  10/30/14 236 lb 11.2 oz (107.4 kg)  04/12/14 234 lb 12.8 oz (106.5 kg)  12/09/13 251 lb (113.9 kg)  11/30/13 250 lb (113.4 kg)  11/10/13 254 lb (115.2 kg)  06/28/13 240 lb (108.9 kg)  04/19/13 234 lb (106.1 kg)  03/16/13 226 lb (102.5 kg)  02/25/13 232 lb (105.2 kg)  11/16/12 229 lb (103.9 kg)  09/20/12 242 lb (109.8 kg)  12/15/11 238 lb (108 kg)  08/14/11 242 lb (109.8 kg)  04/18/10 (!) 240 lb (108.9 kg)  03/14/10 (!) 239 lb (108.4 kg)     ALLERGIES: No Known Allergies  MEDICATIONS: Current Outpatient Prescriptions on File Prior to Visit  Medication Sig Dispense Refill  . acetaminophen (TYLENOL) 500 MG tablet Take 2 tablets (1,000 mg total) by mouth every 6 (six) hours as needed for headache. 30 tablet 0  . metoprolol (LOPRESSOR) 50 MG tablet TAKE 1 TABLET BY MOUTH ONCE DAILY 90 tablet 1  . nortriptyline (PAMELOR) 10 MG capsule Take 1 capsule (10 mg total) by mouth at bedtime. 90 capsule 5  . promethazine (PHENERGAN) 12.5 MG tablet Take 1 tablet (12.5 mg total) by mouth every 8 (eight) hours as needed for nausea or vomiting. 30 tablet 3  . SUMAtriptan (IMITREX) 6 MG/0.5ML SOLN injection Inject  0.5 mg into skin at earliest onset of headache. May repeat in 2 hours if headache persists or recurs. 4.5 mL 3   No current facility-administered medications on file prior to visit.     PAST MEDICAL HISTORY: Past Medical History:  Diagnosis Date  . 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 Fibroids   . Headache(784.0) 06/14/2007  . High blood pressure   . HYPERTENSION 06/14/2007  . Migraines   . Nodular goiter   . Obesity   . Vitamin D deficiency     PAST SURGICAL HISTORY: Past Surgical History:  Procedure Laterality Date  . CESAREAN SECTION    . DILATION AND CURETTAGE OF UTERUS     x2    SOCIAL HISTORY: Social History  Substance Use Topics  . Smoking status: Never Smoker  . Smokeless tobacco:  Never Used  . Alcohol use No    FAMILY HISTORY: Family History  Problem Relation Age of Onset  . Diabetes Mother   . Hypertension Mother   . Thyroid disease Mother     ROS: ROS  PHYSICAL EXAM: Blood pressure (!) 163/90, temperature 98 F (36.7 C), resp. rate 16, height 5\' 4"  (1.626 m), weight 241 lb (109.3 kg), SpO2 98 %. Body mass index is 41.37 kg/m. Physical Exam  RECENT LABS AND TESTS: BMET    Component Value Date/Time   NA 140 10/06/2016 1428   K 4.5 10/06/2016 1428   CL 103 10/06/2016 1428   CO2 23 10/06/2016 1428   GLUCOSE 95 10/06/2016 1428   GLUCOSE 106 (H) 09/05/2016 0900   BUN 8 10/06/2016 1428   CREATININE 0.78 10/06/2016 1428   CREATININE 0.73 09/05/2016 0900   CALCIUM 8.9 10/06/2016 1428   GFRNONAA 90 10/06/2016 1428   GFRAA 103 10/06/2016 1428   Lab Results  Component Value Date   HGBA1C 6.0 (H) 10/06/2016   Lab Results  Component Value Date   INSULIN 44.8 (H) 10/06/2016   CBC    Component Value Date/Time   WBC 6.9 10/06/2016 1428   WBC 5.9 09/05/2016 0900   RBC 5.11 10/06/2016 1428   RBC 5.12 (H) 09/05/2016 0900   HGB 12.9 09/05/2016 0900   HCT 39.2 10/06/2016 1428   PLT 313 09/05/2016 0900   MCV 77 (L) 10/06/2016 1428   MCH 24.7 (L) 10/06/2016 1428   MCH 25.2 (L) 09/05/2016 0900   MCHC 32.1 10/06/2016 1428   MCHC 31.6 (L) 09/05/2016 0900   RDW 15.0 10/06/2016 1428   LYMPHSABS 2.1 10/06/2016 1428   MONOABS 0.5 06/18/2015 0953   EOSABS 0.1 10/06/2016 1428   BASOSABS 0.0 10/06/2016 1428   Iron/TIBC/Ferritin/ %Sat No results found for: IRON, TIBC, FERRITIN, IRONPCTSAT Lipid Panel     Component Value Date/Time   CHOL 130 10/06/2016 1428   TRIG 95 10/06/2016 1428   HDL 43 10/06/2016 1428   CHOLHDL 3.3 09/05/2016 0900   VLDL 21 09/05/2016 0900   LDLCALC 68 10/06/2016 1428   Hepatic Function Panel     Component Value Date/Time   PROT 6.5 10/06/2016 1428   ALBUMIN 3.7 10/06/2016 1428   AST 20 10/06/2016 1428   ALT 19  10/06/2016 1428   ALKPHOS 100 10/06/2016 1428   BILITOT <0.2 10/06/2016 1428   BILIDIR 0.0 11/28/2013 0857      Component Value Date/Time   TSH 2.01 10/06/2016 0904   TSH 1.08 09/05/2016 0900   TSH 2.09 07/15/2016 0804    ASSESSMENT AND PLAN: Vitamin D deficiency - Plan: Vitamin D, Ergocalciferol, (DRISDOL)  50000 units CAPS capsule  Prediabetes - Plan: metFORMIN (GLUCOPHAGE) 500 MG tablet  At risk for diabetes mellitus  Morbid obesity ([place])  PLAN: Vitamin D Deficiency Yittel was informed that low vitamin D levels contributes to fatigue and are associated with obesity, breast, and colon cancer. She agrees to continue to take prescription Vit D @50 ,000 IU every week and will follow up for routine testing of vitamin D, at least 2-3 times per year. She was informed of the risk of over-replacement of vitamin D and agrees to not increase her dose unless he discusses this with [name] first. Start Vitamin D 50,000 IU once every week #4 with 0 refills.   Pre-Diabetes Delesa will continue to work on weight loss, exercise, and decreasing simple carbohydrates in her diet to help decrease the risk of diabetes. We dicussed metformin including benefits and risks. She was informed that eating too many simple carbohydrates or too many calories at one sitting increases the likelihood of GI side effects. Cody requested metformin for now and a prescription was written today. Darrien agreed to follow up with [name] as directed to monitor her progress. Start Metformin 500 mg once every morning #30 with 0 refills.   Diabetes risk counselling Masa was given extended (at least 15 minutes) diabetes prevention counseling today. She is 51 y.o. female and has risk factors for diabetes including obesity. We discussed intensive lifestyle modifications today with an emphasis on weight loss as well as increasing exercise and decreasing simple carbohydrates in her diet.  Obesity Ferne is currently in the action stage of  change. As such, her goal is to continue with weight loss efforts She has agreed to modified Daniel fast times 3 weeks. Copper has been instructed to work up to a goal of 150 minutes of combined cardio and strengthening exercise per week for weight loss and overall health benefits. We discussed the following Behavioral Modification Stratagies today: increasing lean protein intake, decreasing simple carbohydrates , work on meal planning and easy cooking plans, ways to avoid night time snacking and decrease liquid calories Alianna has agreed to follow up with our clinic in 2 weeks. She was informed of the importance of frequent follow up visits to maximize her success with intensive lifestyle modifications for her multiple health conditions.  I, April Moore, CMA, am acting as Education administrator for Dennard Nip, MD  I have reviewed the above documentation for accuracy and completeness, and I agree with the above. -Dennard Nip, MD

## 2016-10-29 ENCOUNTER — Ambulatory Visit: Payer: Self-pay | Admitting: Surgery

## 2016-10-29 DIAGNOSIS — E042 Nontoxic multinodular goiter: Secondary | ICD-10-CM | POA: Diagnosis not present

## 2016-10-30 ENCOUNTER — Ambulatory Visit: Payer: 59

## 2016-11-04 ENCOUNTER — Ambulatory Visit: Payer: 59

## 2016-11-10 ENCOUNTER — Ambulatory Visit (INDEPENDENT_AMBULATORY_CARE_PROVIDER_SITE_OTHER): Payer: 59 | Admitting: Family Medicine

## 2016-11-10 VITALS — BP 126/81 | HR 85 | Temp 98.7°F | Ht 64.0 in | Wt 244.0 lb

## 2016-11-10 DIAGNOSIS — E559 Vitamin D deficiency, unspecified: Secondary | ICD-10-CM | POA: Diagnosis not present

## 2016-11-10 DIAGNOSIS — R7303 Prediabetes: Secondary | ICD-10-CM

## 2016-11-10 DIAGNOSIS — Z9189 Other specified personal risk factors, not elsewhere classified: Secondary | ICD-10-CM

## 2016-11-10 MED ORDER — VITAMIN D (ERGOCALCIFEROL) 1.25 MG (50000 UNIT) PO CAPS: 50000.0000 [IU] | ORAL_CAPSULE | ORAL | 0 refills | Status: DC

## 2016-11-10 NOTE — Progress Notes (Signed)
Office: [phone]  /  Fax: [phone]   HPI:   Chief Complaint: OBESITY [name] is here to discuss her progress with her obesity treatment plan. She is following her eating plan approximately 70 % of the time and states she is exercising once 1 time per week. [name] is currently struggling with increased comfort eating. She reports trying to do the Quillian Quince fast and ate increased amounts of bread and consumed increased amounts of juice. Her weight is 244 lb (110.7 kg) today and has had a weight gain of 3 lbs since her last visit. She has lost 5 lbs since starting treatment with [name].  Vitamin D deficiency [name] has a diagnosis of vitamin D deficiency. She was prescribed and is currently taking vit D, she is not yet at goal, still notes fatigue and denies nausea, vomiting or muscle weakness.  Pre-Diabetes [name] has a diagnosis of prediabetes based on her elevated HgA1c and was informed this puts her at greater risk of developing diabetes. She was prescribed and is taking metformin currently however she reports significant diarrhea when she consumed high amounts of sugar. She denies nausea or hypoglycemia.  At risk for diabetes [name] is at higher than averagerisk for developing diabetes due to her obesity and prediabetes. She currently denies polyuria or polydipsia.     Wt Readings from Last 500 Encounters:  11/10/16 244 lb (110.7 kg)  10/22/16 241 lb (109.3 kg)  10/08/16 254 lb (115.2 kg)  10/06/16 249 lb (112.9 kg)  09/24/16 252 lb (114.3 kg)  09/01/16 249 lb (112.9 kg)  07/18/16 247 lb 3.2 oz (112.1 kg)  06/24/16 243 lb (110.2 kg)  03/11/16 235 lb 12.8 oz (107 kg)  02/19/16 237 lb (107.5 kg)  10/17/15 248 lb (112.5 kg)  10/16/15 246 lb 9.6 oz (111.9 kg)  09/12/15 246 lb 9.6 oz (111.9 kg)  08/28/15 248 lb (112.5 kg)  08/01/15 251 lb (113.9 kg)  06/18/15 246 lb (111.6 kg)  01/10/15 242 lb (109.8 kg)  12/08/14 242 lb (109.8 kg)  10/30/14 236 lb 11.2 oz (107.4 kg)    04/12/14 234 lb 12.8 oz (106.5 kg)  12/09/13 251 lb (113.9 kg)  11/30/13 250 lb (113.4 kg)  11/10/13 254 lb (115.2 kg)  06/28/13 240 lb (108.9 kg)  04/19/13 234 lb (106.1 kg)  03/16/13 226 lb (102.5 kg)  02/25/13 232 lb (105.2 kg)  11/16/12 229 lb (103.9 kg)  09/20/12 242 lb (109.8 kg)  12/15/11 238 lb (108 kg)  08/14/11 242 lb (109.8 kg)  04/18/10 (!) 240 lb (108.9 kg)  03/14/10 (!) 239 lb (108.4 kg)     ALLERGIES: No Known Allergies  MEDICATIONS: Current Outpatient Prescriptions on File Prior to Visit  Medication Sig Dispense Refill   acetaminophen (TYLENOL) 500 MG tablet Take 2 tablets (1,000 mg total) by mouth every 6 (six) hours as needed for headache. 30 tablet 0   metoprolol (LOPRESSOR) 50 MG tablet TAKE 1 TABLET BY MOUTH ONCE DAILY 90 tablet 1   nortriptyline (PAMELOR) 10 MG capsule Take 1 capsule (10 mg total) by mouth at bedtime. 90 capsule 5   promethazine (PHENERGAN) 12.5 MG tablet Take 1 tablet (12.5 mg total) by mouth every 8 (eight) hours as needed for nausea or vomiting. 30 tablet 3   SUMAtriptan (IMITREX) 6 MG/0.5ML SOLN injection Inject 0.5 mg into skin at earliest onset of headache. May repeat in 2 hours if headache persists or recurs. 4.5 mL 3   Vitamin D, Ergocalciferol, (DRISDOL) 50000 units CAPS  capsule Take 1 capsule (50,000 Units total) by mouth every 7 (seven) days. 4 capsule 0   metFORMIN (GLUCOPHAGE) 500 MG tablet Take 1 tablet (500 mg total) by mouth 1 day or 1 dose. 30 tablet 0   No current facility-administered medications on file prior to visit.     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   Fibroids    Headache(784.0) 06/14/2007   High blood pressure    HYPERTENSION 06/14/2007   Migraines    Nodular goiter    Obesity    Vitamin D deficiency     PAST SURGICAL HISTORY: Past Surgical History:  Procedure Laterality Date   CESAREAN SECTION     DILATION AND CURETTAGE OF  UTERUS     x2    SOCIAL HISTORY: Social History  Substance Use Topics   Smoking status: Never Smoker   Smokeless tobacco: Never Used   Alcohol use No    FAMILY HISTORY: Family History  Problem Relation Age of Onset   Diabetes Mother    Hypertension Mother    Thyroid disease Mother     ROS: Review of Systems  Constitutional: Positive for malaise/fatigue.  Gastrointestinal: Positive for diarrhea. Negative for nausea and vomiting.  Genitourinary: Negative for frequency.  Musculoskeletal:       Negative Muscle weakness  Endo/Heme/Allergies: Negative for polydipsia.       Negative hypoglycemia    PHYSICAL EXAM: Blood pressure 126/81, pulse 85, temperature 98.7 F (37.1 C), temperature source Oral, height 5\' 4"  (1.626 m), weight 244 lb (110.7 kg), SpO2 96 %. Body mass index is 41.88 kg/m. Physical Exam  Constitutional: She is oriented to person, place, and time. She appears well-developed and well-nourished.  Cardiovascular: Normal rate.   Pulmonary/Chest: Effort normal.  Musculoskeletal: Normal range of motion.  Neurological: She is oriented to person, place, and time.  Skin: Skin is warm and dry.  Psychiatric: She has a normal mood and affect. Her behavior is normal.  Vitals reviewed.   RECENT LABS AND TESTS: BMET    Component Value Date/Time   NA 140 10/06/2016 1428   K 4.5 10/06/2016 1428   CL 103 10/06/2016 1428   CO2 23 10/06/2016 1428   GLUCOSE 95 10/06/2016 1428   GLUCOSE 106 (H) 09/05/2016 0900   BUN 8 10/06/2016 1428   CREATININE 0.78 10/06/2016 1428   CREATININE 0.73 09/05/2016 0900   CALCIUM 8.9 10/06/2016 1428   GFRNONAA 90 10/06/2016 1428   GFRAA 103 10/06/2016 1428   Lab Results  Component Value Date   HGBA1C 6.0 (H) 10/06/2016   Lab Results  Component Value Date   INSULIN 44.8 (H) 10/06/2016   CBC    Component Value Date/Time   WBC 6.9 10/06/2016 1428   WBC 5.9 09/05/2016 0900   RBC 5.11 10/06/2016 1428   RBC 5.12 (H)  09/05/2016 0900   HGB 12.9 09/05/2016 0900   HCT 39.2 10/06/2016 1428   PLT 313 09/05/2016 0900   MCV 77 (L) 10/06/2016 1428   MCH 24.7 (L) 10/06/2016 1428   MCH 25.2 (L) 09/05/2016 0900   MCHC 32.1 10/06/2016 1428   MCHC 31.6 (L) 09/05/2016 0900   RDW 15.0 10/06/2016 1428   LYMPHSABS 2.1 10/06/2016 1428   MONOABS 0.5 06/18/2015 0953   EOSABS 0.1 10/06/2016 1428   BASOSABS 0.0 10/06/2016 1428   Iron/TIBC/Ferritin/ %Sat No results found for: IRON, TIBC, FERRITIN, IRONPCTSAT Lipid Panel     Component Value Date/Time  CHOL 130 10/06/2016 1428   TRIG 95 10/06/2016 1428   HDL 43 10/06/2016 1428   CHOLHDL 3.3 09/05/2016 0900   VLDL 21 09/05/2016 0900   LDLCALC 68 10/06/2016 1428   Hepatic Function Panel     Component Value Date/Time   PROT 6.5 10/06/2016 1428   ALBUMIN 3.7 10/06/2016 1428   AST 20 10/06/2016 1428   ALT 19 10/06/2016 1428   ALKPHOS 100 10/06/2016 1428   BILITOT <0.2 10/06/2016 1428   BILIDIR 0.0 11/28/2013 0857      Component Value Date/Time   TSH 2.01 10/06/2016 0904   TSH 1.08 09/05/2016 0900   TSH 2.09 07/15/2016 0804    ASSESSMENT AND PLAN: Vitamin D deficiency - Plan: Vitamin D, Ergocalciferol, (DRISDOL) 50000 units CAPS capsule  Prediabetes  At risk for diabetes mellitus  Morbid obesity ([place])  PLAN:    Vitamin D Deficiency Calah was informed that low vitamin D levels contributes to fatigue and are associated with obesity, breast, and colon cancer. She agrees to continue to take prescription Vit D @50 ,000 IU every week, will refill for 1 month with no refills and will follow up for routine testing of vitamin D, at least 2-3 times per year. She was informed of the risk of over-replacement of vitamin D and agrees to not increase her dose unless he discusses this with [name] first.  Pre-Diabetes Sheyla will continue to work on weight loss, exercise, and decreasing simple carbohydrates in her diet to help decrease the risk of diabetes. We  dicussed metformin including benefits and risks. She was informed that eating too many simple carbohydrates or too many calories at one sitting increases the likelihood of GI side effects. Alazay agrees to decrease metformin to 1/2 tablet by mouth every morning with food and decrease sugar in her diet. Chinelo agreed to follow up with [name] as directed to monitor her progress.  Diabetes risk counselling Aunna was given extended (at least 15 minutes) diabetes prevention counseling today. She is 51 y.o. female and has risk factors for diabetes including obesity. We discussed intensive lifestyle modifications today with an emphasis on weight loss as well as increasing exercise and decreasing simple carbohydrates in her diet.  Obesity Mariea is currently in the action stage of change.As such, her goal is to get back to weight loss efforts. She has agreed to get back to strict Category 2 plan. Eva has been instructed to work up to a goal of 150 minutes of combined cardio and strengthening exercise per week for weight loss and overall health benefits. We discussed the following Behavioral Modification Stratagies today: ways to avoid night time snacking.  Bayli has agreed to follow up with our clinic in 2 weeks. She was informed of the importance of frequent follow up visits to maximize her success with intensive lifestyle modifications for her multiple health conditions.  I, Doreene Nest, am acting as scribe for Dennard Nip, MD  I have reviewed the above documentation for accuracy and completeness, and I agree with the above. -Dennard Nip, MD

## 2016-11-12 ENCOUNTER — Other Ambulatory Visit: Payer: 59

## 2016-11-13 ENCOUNTER — Encounter (HOSPITAL_COMMUNITY)
Admission: RE | Admit: 2016-11-13 | Discharge: 2016-11-13 | Disposition: A | Discharge status: Home / Self Care | Payer: 59 | Source: Ambulatory Visit | Attending: Surgery | Admitting: Surgery

## 2016-11-13 ENCOUNTER — Ambulatory Visit (HOSPITAL_COMMUNITY)
Admission: RE | Admit: 2016-11-13 | Discharge: 2016-11-13 | Disposition: A | Discharge status: Home / Self Care | Payer: 59 | Source: Ambulatory Visit | Attending: Anesthesiology | Admitting: Anesthesiology

## 2016-11-13 ENCOUNTER — Encounter (HOSPITAL_COMMUNITY): Payer: Self-pay

## 2016-11-13 DIAGNOSIS — R05 Cough: Secondary | ICD-10-CM | POA: Diagnosis not present

## 2016-11-13 DIAGNOSIS — Z01812 Encounter for preprocedural laboratory examination: Secondary | ICD-10-CM | POA: Insufficient documentation

## 2016-11-13 DIAGNOSIS — E042 Nontoxic multinodular goiter: Secondary | ICD-10-CM | POA: Insufficient documentation

## 2016-11-13 DIAGNOSIS — Z01818 Encounter for other preprocedural examination: Secondary | ICD-10-CM

## 2016-11-13 LAB — CBC
HCT: 38.5 % (ref 36.0–46.0)
Hemoglobin: 13 g/dL (ref 12.0–15.0)
MCH: 25.3 pg — ABNORMAL LOW (ref 26.0–34.0)
MCHC: 33.8 g/dL (ref 30.0–36.0)
MCV: 75 fL — ABNORMAL LOW (ref 78.0–100.0)
Platelets: 279 10*3/uL (ref 150–400)
RBC: 5.13 MIL/uL — ABNORMAL HIGH (ref 3.87–5.11)
RDW: 14.2 % (ref 11.5–15.5)
WBC: 6.1 10*3/uL (ref 4.0–10.5)

## 2016-11-13 LAB — BASIC METABOLIC PANEL
ANION GAP: 6 (ref 5–15)
BUN: 12 mg/dL (ref 6–20)
CALCIUM: 8.8 mg/dL — AB (ref 8.9–10.3)
CO2: 23 mmol/L (ref 22–32)
CREATININE: 0.74 mg/dL (ref 0.44–1.00)
Chloride: 107 mmol/L (ref 101–111)
GFR calc non Af Amer: >60 mL/min (ref >60)
Glucose, Bld: 111 mg/dL — ABNORMAL HIGH (ref 65–99)
Potassium: 3.8 mmol/L (ref 3.5–5.1)
SODIUM: 136 mmol/L (ref 135–145)

## 2016-11-13 LAB — HCG, SERUM, QUALITATIVE: Preg, Serum: NEGATIVE

## 2016-11-13 NOTE — Progress Notes (Signed)
Pt. Connected with weight mgt. Office at [place], she also sees PCP, [facility].  Pt. Denies all chest concerns & denies ever having any advanced cardiac test.

## 2016-11-13 NOTE — Pre-Procedure Instructions (Signed)
DESHANTA FREUND  11/13/2016      [place], [place] - [address] [facility]. [address] [place] [address] Phone: [phone] Fax: [phone]    Your procedure is scheduled on Tuesday, November 18, 2016  Report to [facility] Admitting at 5:30 A.M.  Call this number if you have problems the morning of surgery:  [phone]   Remember:  Do not eat food or drink liquids after midnight Monday, November 17, 2016  Take these medicines the morning of surgery with A SIP OF WATER : metoprolol (LOPRESSOR),  If needed: acetaminophen (TYLENOL) for pain, SUMAtriptan (IMITREX) for  migraine headache, promethazine (PHENERGAN) for nausea or vomiting Stop (NOW) taking Aspirin fish oil, Then the day before surgery HOLD Coenzyme Q10 (COQ10), vitamins  and herbal medications. Do not take any NSAIDs ie: Ibuprofen, Advil, Naproxen, BC and Goody Powderor any medication containing Aspirin such as diclofenac (VOLTAREN); stop now.    How to Manage Your Diabetes Before and After Surgery  Why is it important to control my blood sugar before and after surgery? . Improving blood sugar levels before and after surgery helps healing and can limit problems. . A way of improving blood sugar control is eating a healthy diet by: o  Eating less sugar and carbohydrates o  Increasing activity/exercise o  Talking with your doctor about reaching your blood sugar goals . High blood sugars (greater than 180 mg/dL) can raise your risk of infections and slow your recovery, so you will need to focus on controlling your diabetes during the weeks before surgery. . Make sure that the doctor who takes care of your diabetes knows about your planned surgery including the date and location.  How do I manage my blood sugar before surgery? . Check your blood sugar at least 4 times a day, starting 2 days before surgery, to make sure that the level is not too high or  low. o Check your blood sugar the morning of your surgery when you wake up and every 2 hours until you get to the Short Stay unit. . If your blood sugar is less than 70 mg/dL, you will need to treat for low blood sugar: o Do not take insulin. o Treat a low blood sugar (less than 70 mg/dL) with  cup of clear juice (cranberry or apple), 4 glucose tablets, OR glucose gel. o Recheck blood sugar in 15 minutes after treatment (to make sure it is greater than 70 mg/dL). If your blood sugar is not greater than 70 mg/dL on recheck, call [phone] for further instructions. . Report your blood sugar to the short stay nurse when you get to Short Stay.  . If you are admitted to the hospital after surgery: o Your blood sugar will be checked by the staff and you will probably be given insulin after surgery (instead of oral diabetes medicines) to make sure you have good blood sugar levels. o The goal for blood sugar control after surgery is 80-180 mg/dL.  WHAT DO I DO ABOUT MY DIABETES MEDICATION?   Marland Kitchen Do not take oral diabetes medicines (pills) the morning of surgery such as metFORMIN (GLUCOPHAGE)  Special Instructions: [place] - Preparing for Surgery  Before surgery, you can play an important role.  Because skin is not sterile, your skin needs to be as free of germs as possible.  You can reduce the number of germs on you skin by washing with CHG (chlorahexidine gluconate)  soap before surgery.  CHG is an antiseptic cleaner which kills germs and bonds with the skin to continue killing germs even after washing.  Please DO NOT use if you have an allergy to CHG or antibacterial soaps.  If your skin becomes reddened/irritated stop using the CHG and inform your nurse when you arrive at Short Stay.  Do not shave (including legs and underarms) for at least 48 hours prior to the first CHG shower.  You may shave your face.  Please follow these instructions carefully:   1.  Shower with CHG Soap the night  before surgery and the  morning of Surgery.  2.  If you choose to wash your hair, wash your hair first as usual with your  normal shampoo.  3.  After you shampoo, rinse your hair and body thoroughly to remove the  Shampoo.  4.  Use CHG as you would any other liquid soap.  You can apply chg directly to the skin and wash gently with scrungie or a clean washcloth.  5.  Apply the CHG Soap to your body ONLY FROM THE NECK DOWN.    Do not use on open wounds or open sores.  Avoid contact with your eyes, ears, mouth and genitals (private parts).  Wash genitals (private parts)   with your normal soap.  6.  Wash thoroughly, paying special attention to the area where your surgery will be performed.  7.  Thoroughly rinse your body with warm water from the neck down.  8.  DO NOT shower/wash with your normal soap after using and rinsing off   the CHG Soap.  9.  Pat yourself dry with a clean towel.            10.  Wear clean pajamas.            11.  Place clean sheets on your bed the night of your first shower and do not sleep with pets.  Day of Surgery  Do not apply any lotions/deodorants the morning of surgery.  Please wear clean clothes to the hospital/surgery center.  Patient Signature:  Date:   Nurse Signature:  Date:   Reviewed and Endorsed by [facility] Patient Education Committee, August 2015  Do not wear jewelry, make-up or nail polish.  Do not wear lotions, powders, or perfumes, or deoderant.  Do not shave 48 hours prior to surgery.  .  Do not bring valuables to the hospital.  [facility] is not responsible for any belongings or valuables.  Contacts, dentures or bridgework may not be worn into surgery.  Leave your suitcase in the car.  After surgery it may be brought to your room. For patients admitted to the hospital, discharge time will be determined by your treatment team. Patients discharged the day of surgery will not be allowed to drive home.  Special instructions: Shower the night  before surgery and the morning of surgery with CHG. Please read over the following fact sheets that you were given. Pain Booklet, Coughing and Deep Breathing and Surgical Site Infection Prevention

## 2016-11-16 ENCOUNTER — Encounter (HOSPITAL_COMMUNITY): Payer: Self-pay | Admitting: Surgery

## 2016-11-16 NOTE — H&P (Signed)
General Surgery [facility]  [name] DOB: January 18, 1966 Married / Language: English / Race: Black or African American Female  History of Present Illness  The patient is a 51 year old female who presents with a complaint of Enlarged thyroid.  Patient is referred by Dr. Elayne Snare for evaluation of multinodular thyroid goiter with compressive symptoms. Patient was originally diagnosed in 48. She underwent ultrasound at that time. She has been followed with sequential ultrasound scanning which is shown gradual enlargement of multiple thyroid nodules. Dominant nodules in the isthmus measuring 2.6 cm. Previous fine-needle aspiration biopsy was benign. There are bilateral thyroid nodules present. Patient has never been on thyroid medication. Recent TSH is normal at 2.01. Patient has had no prior head or neck surgery. There is a family history of thyroid disease of unknown type in the patient's mother. There is no history of thyroid malignancy. There is no family history of other endocrine neoplasms. Patient complains of compressive symptoms including snoring, mild dysphagia, and globus sensation. She denies tremors or palpitations. 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   Past Surgical History  Cesarean Section - 1   Diagnostic Studies History Colonoscopy  never Mammogram  1-3 years ago  Allergies  No Known Drug Allergies 10/29/2016  Medication History Tylenol (500MG  Capsule, Oral as needed) Active. MetFORMIN HCl (500MG  Tablet, Oral daily) Active. Lopressor (50MG  Tablet, Oral daily) Active. Nortriptyline HCl (10MG  Capsule, Oral daily) Active. Promethazine HCl (12.5MG  Tablet, Oral as needed) Active. Imitrex (6MG /0.5ML Solution, Subcutaneous daily) Active. Vitamin D (Cholecalciferol) (1000UNIT Capsule, Oral daily) Active. Medications Reconciled  Social History Caffeine use  Coffee, Tea. No drug use  Tobacco use  Never  smoker.  Family History  Diabetes Mellitus  Mother. Hypertension  Father, Mother. Thyroid problems  Mother.  Pregnancy / Birth History Age at menarche  65 years. Contraceptive History  Oral contraceptives. Gravida  3 Length (months) of breastfeeding  7-12 Maternal age  56-25 Para  2 Regular periods   Other Problems High blood pressure  Migraine Headache   Review of Systems General Present- Fatigue and Weight Gain. Not Present- Appetite Loss, Chills, Fever, Night Sweats and Weight Loss. HEENT Not Present- Earache, Hearing Loss, Hoarseness, Nose Bleed, Oral Ulcers, Ringing in the Ears, Seasonal Allergies, Sinus Pain, Sore Throat, Visual Disturbances, Wears glasses/contact lenses and Yellow Eyes. Respiratory Not Present- Bloody sputum, Chronic Cough, Difficulty Breathing, Snoring and Wheezing. Breast Not Present- Breast Mass, Breast Pain, Nipple Discharge and Skin Changes. Cardiovascular Not Present- Chest Pain, Difficulty Breathing Lying Down, Leg Cramps, Palpitations, Rapid Heart Rate, Shortness of Breath and Swelling of Extremities. Gastrointestinal Not Present- Abdominal Pain, Bloating, Bloody Stool, Change in Bowel Habits, Chronic diarrhea, Constipation, Difficulty Swallowing, Excessive gas, Gets full quickly at meals, Hemorrhoids, Indigestion, Nausea, Rectal Pain and Vomiting. Female Genitourinary Not Present- Frequency, Nocturia, Painful Urination, Pelvic Pain and Urgency. Musculoskeletal Not Present- Back Pain, Joint Pain, Joint Stiffness, Muscle Pain, Muscle Weakness and Swelling of Extremities. Psychiatric Not Present- Anxiety, Bipolar, Change in Sleep Pattern, Depression, Fearful and Frequent crying. Hematology Not Present- Blood Thinners, Easy Bruising, Excessive bleeding, Gland problems, HIV and Persistent Infections.  Vitals  Weight: 247.4 lb Height: 64in Body Surface Area: 2.14 m Body Mass Index: 42.47 kg/m  Temp.: 98.66F  Pulse: 103 (Regular)   BP: 146/82 (Sitting, Left Arm, Standard)  Physical Exam The physical exam findings are as follows: Note:General - appears comfortable, no distress; not diaphorectic  HEENT - normocephalic; sclerae clear, gaze conjugate;  mucous membranes moist, dentition good; voice normal  Neck - asymmetric on extension; no palpable anterior or posterior cervical adenopathy; dominant mass palpable in the anterior neck located in the thyroid isthmus, 3 cm in size, mobile, smooth, mildly tender; right and left lobes without palpable nodules but with mild to moderate tenderness  Chest - clear bilaterally without rhonchi, rales, or wheeze  Cor - regular rhythm with normal rate; no significant murmur  Ext - non-tender without significant edema or lymphedema  Neuro - grossly intact; no tremor   Assessment & Plan   MULTIPLE THYROID NODULES (E04.2)  Pt Education - Pamphlet Given - The Thyroid Book: discussed with patient and provided information  Patient presents with multiple thyroid nodules and small thyroid goiter. She is provided with written literature on thyroid surgery to review at home.  Patient has bilateral thyroid nodules with a dominant nodule in the thyroid isthmus. She has developed progressive compressive symptoms including snoring, dysphagia, and globus sensation. She has discussed this with her endocrinologist and a recommendation has been made for thyroidectomy. Patient presents today to discuss thyroidectomy. We discussed the procedure of total thyroidectomy. We discussed risk and benefits of the procedure including the potential for recurrent laryngeal nerve injury and injury to parathyroid glands. We discussed the postoperative need for lifelong thyroid hormone replacement. We discussed the hospital stay to be anticipated, the surgical incision, the postoperative recovery, and her return to work and activity. Patient understands and wishes to proceed with surgery in the near  future.  We did discuss the fact that surgery was not absolutely indicated. But with her developing compressive symptoms and continued enlargement of her thyroid nodules, total thyroidectomy was certainly a reasonable choice.  The risks and benefits of the procedure have been discussed at length with the patient. The patient understands the proposed procedure, potential alternative treatments, and the course of recovery to be expected. All of the patient's questions have been answered at this time. The patient wishes to proceed with surgery.  Earnstine Regal, MD, [facility] Office: [phone]

## 2016-11-17 ENCOUNTER — Ambulatory Visit: Payer: 59 | Admitting: Endocrinology

## 2016-11-17 NOTE — Anesthesia Preprocedure Evaluation (Addendum)
Anesthesia Evaluation  Patient identified by MRN, date of birth, ID band Patient awake    Reviewed: Allergy & Precautions, NPO status , Patient's Chart, lab work & pertinent test results, reviewed documented beta blocker date and time   Airway Mallampati: III  TM Distance: >3 FB Neck ROM: Full    Dental  (+) Dental Advisory Given   Pulmonary neg pulmonary ROS,    breath sounds clear to auscultation       Cardiovascular hypertension, Pt. on medications and Pt. on home beta blockers  Rhythm:Regular Rate:Normal     Neuro/Psych  Headaches,    GI/Hepatic negative GI ROS, Neg liver ROS,   Endo/Other  Morbid obesity  Renal/GU negative Renal ROS     Musculoskeletal   Abdominal   Peds  Hematology negative hematology ROS (+)   Anesthesia Other Findings   Reproductive/Obstetrics                            Lab Results  Component Value Date   WBC 6.1 11/13/2016   HGB 13.0 11/13/2016   HCT 38.5 11/13/2016   MCV 75.0 (L) 11/13/2016   PLT 279 11/13/2016   Lab Results  Component Value Date   CREATININE 0.74 11/13/2016   BUN 12 11/13/2016   NA 136 11/13/2016   K 3.8 11/13/2016   CL 107 11/13/2016   CO2 23 11/13/2016    Anesthesia Physical Anesthesia Plan  ASA: III  Anesthesia Plan: General   Post-op Pain Management:    Induction: Intravenous  Airway Management Planned: Oral ETT  Additional Equipment:   Intra-op Plan:   Post-operative Plan: Extubation in OR  Informed Consent: I have reviewed the patients History and Physical, chart, labs and discussed the procedure including the risks, benefits and alternatives for the proposed anesthesia with the patient or authorized representative who has indicated his/her understanding and acceptance.   Dental advisory given  Plan Discussed with: CRNA  Anesthesia Plan Comments:        Anesthesia Quick Evaluation

## 2016-11-18 ENCOUNTER — Ambulatory Visit (HOSPITAL_COMMUNITY): Payer: 59 | Admitting: Certified Registered Nurse Anesthetist

## 2016-11-18 ENCOUNTER — Encounter (HOSPITAL_COMMUNITY): Payer: Self-pay | Admitting: Surgery

## 2016-11-18 ENCOUNTER — Encounter (HOSPITAL_COMMUNITY)
Admission: RE | Disposition: A | Discharge status: Home / Self Care | Payer: Self-pay | Source: Ambulatory Visit | Attending: Surgery

## 2016-11-18 ENCOUNTER — Observation Stay (HOSPITAL_COMMUNITY)
Admission: RE | Admit: 2016-11-18 | Discharge: 2016-11-19 | Disposition: A | Discharge status: Home / Self Care | Payer: 59 | Source: Ambulatory Visit | Attending: Surgery | Admitting: Surgery

## 2016-11-18 DIAGNOSIS — Z8349 Family history of other endocrine, nutritional and metabolic diseases: Secondary | ICD-10-CM | POA: Diagnosis not present

## 2016-11-18 DIAGNOSIS — E042 Nontoxic multinodular goiter: Principal | ICD-10-CM | POA: Diagnosis present

## 2016-11-18 DIAGNOSIS — Z7984 Long term (current) use of oral hypoglycemic drugs: Secondary | ICD-10-CM | POA: Insufficient documentation

## 2016-11-18 DIAGNOSIS — Z79899 Other long term (current) drug therapy: Secondary | ICD-10-CM | POA: Insufficient documentation

## 2016-11-18 DIAGNOSIS — I10 Essential (primary) hypertension: Secondary | ICD-10-CM | POA: Diagnosis not present

## 2016-11-18 DIAGNOSIS — Z6841 Body Mass Index (BMI) 40.0 and over, adult: Secondary | ICD-10-CM | POA: Insufficient documentation

## 2016-11-18 DIAGNOSIS — R51 Headache: Secondary | ICD-10-CM | POA: Diagnosis not present

## 2016-11-18 HISTORY — PX: THYROIDECTOMY: SHX17

## 2016-11-18 LAB — GLUCOSE, CAPILLARY: Glucose-Capillary: 135 mg/dL — ABNORMAL HIGH (ref 65–99)

## 2016-11-18 SURGERY — THYROIDECTOMY
Anesthesia: General | Site: Neck

## 2016-11-18 MED ORDER — HYDROMORPHONE HCL 1 MG/ML IJ SOLN
INTRAMUSCULAR | Status: AC
  Filled 2016-11-18: qty 1

## 2016-11-18 MED ORDER — HYDROMORPHONE HCL 1 MG/ML IJ SOLN
0.2500 mg | INTRAMUSCULAR | Status: DC | PRN
  Administered 2016-11-18 (×3): 0.5 mg via INTRAVENOUS

## 2016-11-18 MED ORDER — CALCIUM CARBONATE 1250 (500 CA) MG PO TABS
2.0000 | ORAL_TABLET | Freq: Three times a day (TID) | ORAL | Status: DC
  Administered 2016-11-18 – 2016-11-19 (×2): 1000 mg via ORAL
  Filled 2016-11-18 (×3): qty 1

## 2016-11-18 MED ORDER — SUCCINYLCHOLINE CHLORIDE 200 MG/10ML IV SOSY
PREFILLED_SYRINGE | INTRAVENOUS | Status: AC
  Filled 2016-11-18: qty 10

## 2016-11-18 MED ORDER — POTASSIUM CHLORIDE IN NACL 20-0.45 MEQ/L-% IV SOLN
INTRAVENOUS | Status: DC
  Administered 2016-11-18: 17:00:00 via INTRAVENOUS
  Filled 2016-11-18: qty 1000

## 2016-11-18 MED ORDER — MIDAZOLAM HCL 2 MG/2ML IJ SOLN
INTRAMUSCULAR | Status: AC
  Filled 2016-11-18: qty 2

## 2016-11-18 MED ORDER — FENTANYL CITRATE (PF) 100 MCG/2ML IJ SOLN
INTRAMUSCULAR | Status: AC
  Filled 2016-11-18: qty 4

## 2016-11-18 MED ORDER — PROMETHAZINE HCL 25 MG/ML IJ SOLN
INTRAMUSCULAR | Status: AC
  Filled 2016-11-18: qty 1

## 2016-11-18 MED ORDER — ONDANSETRON 4 MG PO TBDP: 4.0000 mg | ORAL_TABLET | Freq: Four times a day (QID) | ORAL | Status: DC | PRN

## 2016-11-18 MED ORDER — ACETAMINOPHEN 650 MG RE SUPP: 650.0000 mg | Freq: Four times a day (QID) | RECTAL | Status: DC | PRN

## 2016-11-18 MED ORDER — FENTANYL CITRATE (PF) 100 MCG/2ML IJ SOLN
INTRAMUSCULAR | Status: DC | PRN
  Administered 2016-11-18 (×2): 100 ug via INTRAVENOUS

## 2016-11-18 MED ORDER — CHLORHEXIDINE GLUCONATE CLOTH 2 % EX PADS: 6.0000 | MEDICATED_PAD | Freq: Once | CUTANEOUS | Status: DC

## 2016-11-18 MED ORDER — HEMOSTATIC AGENTS (NO CHARGE) OPTIME
TOPICAL | Status: DC | PRN
  Administered 2016-11-18: 1 via TOPICAL

## 2016-11-18 MED ORDER — METFORMIN HCL 500 MG PO TABS
250.0000 mg | ORAL_TABLET | Freq: Every day | ORAL | Status: DC
  Administered 2016-11-18: 250 mg via ORAL
  Filled 2016-11-18: qty 1

## 2016-11-18 MED ORDER — DEXAMETHASONE SODIUM PHOSPHATE 10 MG/ML IJ SOLN
INTRAMUSCULAR | Status: DC | PRN
  Administered 2016-11-18: 10 mg via INTRAVENOUS

## 2016-11-18 MED ORDER — LIDOCAINE 2% (20 MG/ML) 5 ML SYRINGE
INTRAMUSCULAR | Status: AC
  Filled 2016-11-18: qty 5

## 2016-11-18 MED ORDER — NORTRIPTYLINE HCL 10 MG PO CAPS
10.0000 mg | ORAL_CAPSULE | Freq: Every day | ORAL | Status: DC
  Administered 2016-11-18: 10 mg via ORAL
  Filled 2016-11-18: qty 1

## 2016-11-18 MED ORDER — METOPROLOL TARTRATE 50 MG PO TABS: 50.0000 mg | ORAL_TABLET | Freq: Every day | ORAL | Status: DC

## 2016-11-18 MED ORDER — MIDAZOLAM HCL 5 MG/5ML IJ SOLN
INTRAMUSCULAR | Status: DC | PRN
  Administered 2016-11-18: 2 mg via INTRAVENOUS

## 2016-11-18 MED ORDER — ACETAMINOPHEN 325 MG PO TABS
650.0000 mg | ORAL_TABLET | Freq: Four times a day (QID) | ORAL | Status: DC | PRN
  Administered 2016-11-19: 650 mg via ORAL
  Filled 2016-11-18: qty 2

## 2016-11-18 MED ORDER — HYDROCODONE-ACETAMINOPHEN 5-325 MG PO TABS
1.0000 | ORAL_TABLET | ORAL | Status: DC | PRN
Start: 2016-11-18 — End: 2016-11-19
  Administered 2016-11-18 (×2): 2 via ORAL
  Filled 2016-11-18 (×2): qty 2

## 2016-11-18 MED ORDER — ONDANSETRON HCL 4 MG/2ML IJ SOLN: 4.0000 mg | Freq: Four times a day (QID) | INTRAMUSCULAR | Status: DC | PRN

## 2016-11-18 MED ORDER — LIDOCAINE 2% (20 MG/ML) 5 ML SYRINGE
INTRAMUSCULAR | Status: DC | PRN
  Administered 2016-11-18: 80 mg via INTRAVENOUS

## 2016-11-18 MED ORDER — SUCCINYLCHOLINE CHLORIDE 20 MG/ML IJ SOLN
INTRAMUSCULAR | Status: DC | PRN
  Administered 2016-11-18: 100 mg via INTRAVENOUS

## 2016-11-18 MED ORDER — LACTATED RINGERS IV SOLN
INTRAVENOUS | Status: DC | PRN
  Administered 2016-11-18 (×2): via INTRAVENOUS

## 2016-11-18 MED ORDER — CEFAZOLIN SODIUM 1 G IJ SOLR
INTRAMUSCULAR | Status: DC | PRN
  Administered 2016-11-18: 2 g via INTRAMUSCULAR

## 2016-11-18 MED ORDER — PROPOFOL 10 MG/ML IV BOLUS
INTRAVENOUS | Status: AC
  Filled 2016-11-18: qty 40

## 2016-11-18 MED ORDER — PROPOFOL 10 MG/ML IV BOLUS
INTRAVENOUS | Status: DC | PRN
  Administered 2016-11-18: 160 mg via INTRAVENOUS
  Administered 2016-11-18: 30 mg via INTRAVENOUS

## 2016-11-18 MED ORDER — ONDANSETRON HCL 4 MG/2ML IJ SOLN
INTRAMUSCULAR | Status: DC | PRN
  Administered 2016-11-18: 4 mg via INTRAVENOUS

## 2016-11-18 MED ORDER — DEXAMETHASONE SODIUM PHOSPHATE 10 MG/ML IJ SOLN
INTRAMUSCULAR | Status: AC
  Filled 2016-11-18: qty 1

## 2016-11-18 MED ORDER — 0.9 % SODIUM CHLORIDE (POUR BTL) OPTIME
TOPICAL | Status: DC | PRN
  Administered 2016-11-18: 1000 mL

## 2016-11-18 MED ORDER — PROMETHAZINE HCL 25 MG/ML IJ SOLN
6.2500 mg | INTRAMUSCULAR | Status: DC | PRN
  Administered 2016-11-18: 12.5 mg via INTRAVENOUS

## 2016-11-18 MED ORDER — HYDROMORPHONE HCL 2 MG/ML IJ SOLN
1.0000 mg | INTRAMUSCULAR | Status: DC | PRN
  Administered 2016-11-18: 1 mg via INTRAVENOUS
  Filled 2016-11-18: qty 1

## 2016-11-18 MED ORDER — ONDANSETRON HCL 4 MG/2ML IJ SOLN
INTRAMUSCULAR | Status: AC
  Filled 2016-11-18: qty 2

## 2016-11-18 SURGICAL SUPPLY — 56 items
ATTRACTOMAT 16X20 MAGNETIC DRP (DRAPES) ×3 IMPLANT
BLADE SURG 10 STRL SS (BLADE) ×3 IMPLANT
BLADE SURG 15 STRL LF DISP TIS (BLADE) ×1 IMPLANT
BLADE SURG 15 STRL SS (BLADE) ×3
BLADE SURG ROTATE 9660 (MISCELLANEOUS) IMPLANT
CANISTER SUCTION 2500CC (MISCELLANEOUS) ×3 IMPLANT
CHLORAPREP W/TINT 10.5 ML (MISCELLANEOUS) ×3 IMPLANT
CLIP TI MEDIUM 24 (CLIP) ×3 IMPLANT
CLIP TI WIDE RED SMALL 24 (CLIP) ×3 IMPLANT
CLOSURE WOUND 1/2 X4 (GAUZE/BANDAGES/DRESSINGS) ×1
COVER SURGICAL LIGHT HANDLE (MISCELLANEOUS) ×3 IMPLANT
CRADLE DONUT ADULT HEAD (MISCELLANEOUS) ×3 IMPLANT
DRAPE LAPAROTOMY 100X72 PEDS (DRAPES) ×3 IMPLANT
DRAPE UTILITY XL STRL (DRAPES) ×3 IMPLANT
ELECT CAUTERY BLADE 6.4 (BLADE) ×3 IMPLANT
ELECT REM PT RETURN 9FT ADLT (ELECTROSURGICAL) ×3
ELECTRODE REM PT RTRN 9FT ADLT (ELECTROSURGICAL) ×1 IMPLANT
GAUZE SPONGE 4X4 12PLY STRL (GAUZE/BANDAGES/DRESSINGS) ×3 IMPLANT
GAUZE SPONGE 4X4 16PLY XRAY LF (GAUZE/BANDAGES/DRESSINGS) ×3 IMPLANT
GLOVE BIO SURGEON STRL SZ7.5 (GLOVE) ×2 IMPLANT
GLOVE BIO SURGEON STRL SZ8 (GLOVE) ×2 IMPLANT
GLOVE BIOGEL PI IND STRL 7.0 (GLOVE) IMPLANT
GLOVE BIOGEL PI IND STRL 7.5 (GLOVE) IMPLANT
GLOVE BIOGEL PI IND STRL 8.5 (GLOVE) IMPLANT
GLOVE BIOGEL PI INDICATOR 7.0 (GLOVE) ×2
GLOVE BIOGEL PI INDICATOR 7.5 (GLOVE) ×2
GLOVE BIOGEL PI INDICATOR 8.5 (GLOVE) ×2
GLOVE ECLIPSE 6.5 STRL STRAW (GLOVE) ×3 IMPLANT
GLOVE SURG ORTHO 8.0 STRL STRW (GLOVE) ×3 IMPLANT
GLOVE SURG SIGNA 7.5 PF LTX (GLOVE) ×2 IMPLANT
GOWN STRL REUS W/ TWL LRG LVL3 (GOWN DISPOSABLE) ×1 IMPLANT
GOWN STRL REUS W/ TWL XL LVL3 (GOWN DISPOSABLE) ×1 IMPLANT
GOWN STRL REUS W/TWL LRG LVL3 (GOWN DISPOSABLE) ×3
GOWN STRL REUS W/TWL XL LVL3 (GOWN DISPOSABLE) ×6
HEMOSTAT SURGICEL 2X4 FIBR (HEMOSTASIS) ×3 IMPLANT
ILLUMINATOR WAVEGUIDE N/F (MISCELLANEOUS) ×2 IMPLANT
KIT BASIN OR (CUSTOM PROCEDURE TRAY) ×3 IMPLANT
KIT ROOM TURNOVER OR (KITS) ×3 IMPLANT
LIGHT WAVEGUIDE WIDE FLAT (MISCELLANEOUS) IMPLANT
NS IRRIG 1000ML POUR BTL (IV SOLUTION) ×3 IMPLANT
PACK SURGICAL SETUP 50X90 (CUSTOM PROCEDURE TRAY) ×3 IMPLANT
PAD ARMBOARD 7.5X6 YLW CONV (MISCELLANEOUS) ×3 IMPLANT
PENCIL BUTTON HOLSTER BLD 10FT (ELECTRODE) ×3 IMPLANT
SHEARS HARMONIC 9CM CVD (BLADE) ×3 IMPLANT
SPECIMEN JAR MEDIUM (MISCELLANEOUS) ×3 IMPLANT
SPONGE INTESTINAL PEANUT (DISPOSABLE) ×3 IMPLANT
STRIP CLOSURE SKIN 1/2X4 (GAUZE/BANDAGES/DRESSINGS) ×2 IMPLANT
SUT MNCRL AB 4-0 PS2 18 (SUTURE) ×3 IMPLANT
SUT SILK 2 0 (SUTURE) ×3
SUT SILK 2-0 18XBRD TIE 12 (SUTURE) ×1 IMPLANT
SUT VIC AB 3-0 SH 18 (SUTURE) ×6 IMPLANT
SYR BULB 3OZ (MISCELLANEOUS) ×3 IMPLANT
TOWEL OR 17X24 6PK STRL BLUE (TOWEL DISPOSABLE) ×3 IMPLANT
TOWEL OR 17X26 10 PK STRL BLUE (TOWEL DISPOSABLE) ×1 IMPLANT
TUBE CONNECTING 12'X1/4 (SUCTIONS) ×1
TUBE CONNECTING 12X1/4 (SUCTIONS) ×2 IMPLANT

## 2016-11-18 NOTE — Anesthesia Postprocedure Evaluation (Signed)
Anesthesia Post Note  Patient: [name]  Procedure(s) Performed: Procedure(s) (LRB): TOTAL THYROIDECTOMY (N/A)  Patient location during evaluation: PACU Anesthesia Type: General Level of consciousness: awake and alert Pain management: pain level controlled Vital Signs Assessment: post-procedure vital signs reviewed and stable Respiratory status: spontaneous breathing, nonlabored ventilation, respiratory function stable and patient connected to nasal cannula oxygen Cardiovascular status: blood pressure returned to baseline and stable Postop Assessment: no signs of nausea or vomiting Anesthetic complications: no       Last Vitals:  Vitals:   11/18/16 1205 11/18/16 1235  BP: 129/81 135/74  Pulse: 76 80  Resp: 14 16  Temp:      Last Pain:  Vitals:   11/18/16 1205  TempSrc:   PainSc: [name]

## 2016-11-18 NOTE — Op Note (Signed)
Procedure Note  Pre-operative Diagnosis:  Multinodular thyroid goiter with compressive symptoms  Post-operative Diagnosis:  same  Surgeon:  Amber Regal, MD, FACS  Assistant:  Alphonsa Overall, MD, FACS   Procedure:  Total thyroidectomy  Anesthesia:  General  Estimated Blood Loss:  minimal  Drains: none         Specimen: thyroid to pathology  Indications:  The patient is a 51 year old female who presents with a complaint of Enlarged thyroid.  Patient is referred by Dr. Elayne Snare for evaluation of multinodular thyroid goiter with compressive symptoms. Patient was originally diagnosed in 70. She underwent ultrasound at that time. She has been followed with sequential ultrasound scanning which is shown gradual enlargement of multiple thyroid nodules. Dominant nodules in the isthmus measuring 2.6 cm. Previous fine-needle aspiration biopsy was benign. There are bilateral thyroid nodules present. Patient has never been on thyroid medication. Recent TSH is normal at 2.01. Patient has had no prior head or neck surgery. There is a family history of thyroid disease of unknown type in the patient's mother. There is no history of thyroid malignancy. There is no family history of other endocrine neoplasms. Patient complains of compressive symptoms including snoring, mild dysphagia, and globus sensation.   Procedure Details: Procedure was done in OR #8 at the [facility].  The patient was brought to the operating room and placed in a supine position on the operating room table.  Following administration of general anesthesia, the patient was positioned and then prepped and draped in the usual aseptic fashion.  After ascertaining that an adequate level of anesthesia had been achieved, a Kocher incision was made with #15 blade.  Dissection was carried through subcutaneous tissues and platysma. Hemostasis was achieved with the electrocautery.  Skin flaps were elevated cephalad and  caudad from the thyroid notch to the sternal notch.  The Mahorner self-retaining retractor was placed for exposure.  Strap muscles were incised in the midline and dissection was begun on the left side.  Strap muscles were reflected laterally.  Left thyroid lobe was mildly enlarged with small nodules.  The left lobe was gently mobilized with blunt dissection.  Superior pole vessels were dissected out and divided individually between small and medium Ligaclips with the Harmonic scalpel.  The thyroid lobe was rolled anteriorly.  Branches of the inferior thyroid artery were divided between small Ligaclips with the Harmonic scalpel.  Inferior venous tributaries were divided between Ligaclips.  Both the superior and inferior parathyroid glands were identified and preserved on their vascular pedicles.  The recurrent laryngeal nerve was identified and preserved along its course.  The ligament of Gwenlyn Found was released with the electrocautery and the gland was mobilized onto the anterior trachea. Isthmus was mobilized across the midline.  There was a large dominant nodule located in the central isthmus.  There was no significant pyramidal lobe present.  Dry pack was placed in the left neck.  Next, the right thyroid lobe was gently mobilized with blunt dissection.  Right thyroid lobe was moderately enlarged with a firm nodule located in the mid to inferior portion of the lobe.  Superior pole vessels were dissected out and divided between small and medium Ligaclips with the Harmonic scalpel.  Superior parathyroid was identified and preserved.  Inferior venous tributaries were divided between medium Ligaclips with the Harmonic scalpel.  The right thyroid lobe was rolled anteriorly and the branches of the inferior thyroid artery divided between small Ligaclips.  The right recurrent laryngeal  nerve was identified and preserved along its course.  The ligament of Gwenlyn Found was released with the electrocautery.  The right thyroid lobe was  mobilized onto the anterior trachea and the remainder of the thyroid was dissected off the anterior trachea and the thyroid was completely excised.  A suture was used to mark the right lobe. The entire thyroid gland was submitted to pathology for review.  The neck was irrigated with warm saline.  Fibrillar was placed throughout the operative field.  Strap muscles were reapproximated in the midline with interrupted 3-0 Vicryl sutures.  Platysma was closed with interrupted 3-0 Vicryl sutures.  Skin was closed with a running 4-0 Monocryl subcuticular suture.  Wound was washed and dried and steri-strips were applied.  Dry gauze dressing was placed.  The patient was awakened from anesthesia and brought to the recovery room.  The patient tolerated the procedure well.   Amber Regal, MD, [facility] Office: [phone]

## 2016-11-18 NOTE — Transfer of Care (Signed)
Immediate Anesthesia Transfer of Care Note  Patient: [name]  Procedure(s) Performed: Procedure(s): TOTAL THYROIDECTOMY (N/A)  Patient Location: PACU  Anesthesia Type:General  Level of Consciousness: patient cooperative and responds to stimulation  Airway & Oxygen Therapy: Patient Spontanous Breathing and Patient connected to nasal cannula oxygen  Post-op Assessment: Report given to RN and Post -op Vital signs reviewed and stable  Post vital signs: Reviewed and stable  Last Vitals:  Vitals:   11/18/16 0643  BP: (!) 133/58  Pulse: 79  Resp: 20  Temp: 36.9 C    Last Pain:  Vitals:   11/18/16 0643  TempSrc: Oral      Patients Stated Pain Goal: 4 ([id number])  Complications: No apparent anesthesia complications

## 2016-11-18 NOTE — Interval H&P Note (Signed)
History and Physical Interval Note:  11/18/2016 7:07 AM  [name]  has presented today for surgery, with the diagnosis of MULTINODULAR THYROID GOITER.  The various methods of treatment have been discussed with the patient and family. After consideration of risks, benefits and other options for treatment, the patient has consented to    Procedure(s): TOTAL THYROIDECTOMY (N/A) as a surgical intervention .    The patient's history has been reviewed, patient examined, no change in status, stable for surgery.  I have reviewed the patient's chart and labs.  Questions were answered to the patient's satisfaction.    Earnstine Regal, MD, [facility] Office: [place]

## 2016-11-19 ENCOUNTER — Encounter (HOSPITAL_COMMUNITY): Payer: Self-pay | Admitting: Surgery

## 2016-11-19 DIAGNOSIS — Z6841 Body Mass Index (BMI) 40.0 and over, adult: Secondary | ICD-10-CM | POA: Diagnosis not present

## 2016-11-19 DIAGNOSIS — Z7984 Long term (current) use of oral hypoglycemic drugs: Secondary | ICD-10-CM | POA: Diagnosis not present

## 2016-11-19 DIAGNOSIS — E042 Nontoxic multinodular goiter: Secondary | ICD-10-CM | POA: Diagnosis not present

## 2016-11-19 DIAGNOSIS — I10 Essential (primary) hypertension: Secondary | ICD-10-CM | POA: Diagnosis not present

## 2016-11-19 DIAGNOSIS — Z8349 Family history of other endocrine, nutritional and metabolic diseases: Secondary | ICD-10-CM | POA: Diagnosis not present

## 2016-11-19 DIAGNOSIS — Z79899 Other long term (current) drug therapy: Secondary | ICD-10-CM | POA: Diagnosis not present

## 2016-11-19 LAB — BASIC METABOLIC PANEL
Anion gap: 5 (ref 5–15)
BUN: 8 mg/dL (ref 6–20)
CHLORIDE: 106 mmol/L (ref 101–111)
CO2: 25 mmol/L (ref 22–32)
Calcium: 9.2 mg/dL (ref 8.9–10.3)
Creatinine, Ser: 0.83 mg/dL (ref 0.44–1.00)
GFR calc Af Amer: >60 mL/min (ref >60)
GFR calc non Af Amer: >60 mL/min (ref >60)
Glucose, Bld: 123 mg/dL — ABNORMAL HIGH (ref 65–99)
POTASSIUM: 4.4 mmol/L (ref 3.5–5.1)
SODIUM: 136 mmol/L (ref 135–145)

## 2016-11-19 MED ORDER — HYDROCODONE-ACETAMINOPHEN 5-325 MG PO TABS: 1.0000 | ORAL_TABLET | ORAL | 0 refills | Status: DC | PRN

## 2016-11-19 MED ORDER — CALCIUM CARBONATE ANTACID 500 MG PO CHEW: 2.0000 | CHEWABLE_TABLET | Freq: Two times a day (BID) | ORAL | 1 refills | Status: DC

## 2016-11-19 MED ORDER — LEVOTHYROXINE SODIUM 88 MCG PO TABS: 88.0000 ug | ORAL_TABLET | Freq: Every day | ORAL | 3 refills | Status: DC

## 2016-11-19 MED FILL — METOPROLOL TARTRATE 50 MG T: 50 | 90 days supply | Qty: 90 | Fill #1

## 2016-11-19 MED FILL — HYDROCODON-APAP 5-325: 5-325 | 3 days supply | Qty: 30 | Fill #0

## 2016-11-19 MED FILL — LEVOTHYROXINE 88 MCG TABLET: 88 | 30 days supply | Qty: 30 | Fill #0

## 2016-11-19 NOTE — Discharge Summary (Signed)
Physician Discharge Summary [facility]  Patient ID: [name] MRN: [id number] DOB/AGE: 1966/05/14 50 y.o.  Admit date: 11/18/2016 Discharge date: 11/19/2016  Admission Diagnoses:  Multinodular thyroid goiter with compressive symptoms  Discharge Diagnoses:  Principal Problem:   Multinodular goiter Active Problems:   Goiter, nontoxic, multinodular   Discharged Condition: good  Hospital Course: Patient was admitted for observation following thyroid surgery.  Post op course was uncomplicated.  Pain was well controlled.  Tolerated diet.  Post op calcium level on morning following surgery was 9.2 mg/dl.  Patient was prepared for discharge home on POD#1.  Consults: None  Treatments: surgery: total thyroidectomy  Discharge Exam: Blood pressure 131/72, pulse 86, temperature 98.9 F (37.2 C), temperature source Oral, resp. rate 16, weight 113 kg (249 lb 1.9 oz), SpO2 97 %. HEENT - clear Neck - wound dry and intact; voice normal; minimal STS   Disposition: Home  Discharge Instructions    Apply dressing    Complete by:  As directed    Apply light gauze dressing to wound before discharge home today.   Diet - low sodium heart healthy    Complete by:  As directed    Discharge instructions    Complete by:  As directed    [facility]  THYROID & PARATHYROID SURGERY:  POST-OP INSTRUCTIONS  Always review your discharge instruction sheet from the facility where your surgery was performed.  A prescription for pain medication may be given to you upon discharge.  Take your pain medication as prescribed.  If narcotic pain medicine is not needed, then you may take acetaminophen (Tylenol) or ibuprofen (Advil) as needed.  Take your usually prescribed medications unless otherwise directed.  If you need a refill on your pain medication, please contact our office during regular business hours.  Prescriptions will not be processed by our office after 5  pm or on weekends.  Start with a light diet upon arrival home, such as soup and crackers or toast.  Be sure to drink plenty of fluids daily.  Resume your normal diet the day after surgery.  Most patients will experience some swelling and bruising on the chest and neck area.  Ice packs will help.  Swelling and bruising can take several days to resolve.   It is common to experience some constipation after surgery.  Increasing fluid intake and taking a stool softener (Colace) will usually help or prevent this problem.  A mild laxative (Milk of Magnesia or Miralax) should be taken according to package directions if there has been no bowel movement after 48 hours.  You have steri-strips and a gauze dressing over your incision.  You may remove the gauze bandage on the second day after surgery, and you may shower at that time.  Leave your steri-strips (small skin tapes) in place directly over the incision.  These strips should remain on the skin for 5-7 days and then be removed.  You may get them wet in the shower and pat them dry.  You may resume regular (light) daily activities beginning the next day - such as daily self-care, walking, climbing stairs - gradually increasing activities as tolerated.  You may have sexual intercourse when it is comfortable.  Refrain from any heavy lifting or straining until approved by your doctor.  You may drive when you no longer are taking prescription pain medication, you can comfortably wear a seatbelt, and you can safely maneuver your car and apply brakes.  You should  see your doctor in the office for a follow-up appointment approximately three weeks after your surgery.  Make sure that you call for this appointment within a day or two after you arrive home to insure a convenient appointment time.  WHEN TO CALL YOUR DOCTOR: -- Fever greater than 101.5 -- Inability to urinate -- Nausea and/or vomiting - persistent -- Extreme swelling or bruising -- Continued bleeding  from incision -- Increased pain, redness, or drainage from the incision -- Difficulty swallowing or breathing -- Muscle cramping or spasms -- Numbness or tingling in hands or around lips  The clinic staff is available to answer your questions during regular business hours.  Please don't hesitate to call and ask to speak to one of the nurses if you have concerns.  Earnstine Regal, MD, [facility] Office: [phone]  Website: [email or web address]   Increase activity slowly    Complete by:  As directed    Remove dressing in 24 hours    Complete by:  As directed      Allergies as of 11/19/2016      Reactions   No Known Allergies       Medication List    TAKE these medications   acetaminophen 500 MG tablet Commonly known as:  TYLENOL Take 2 tablets (1,000 mg total) by mouth every 6 (six) hours as needed for headache.   calcium carbonate 500 MG chewable tablet Commonly known as:  TUMS Chew 2 tablets (400 mg of elemental calcium total) by mouth 2 (two) times daily.   CoQ10 100 MG Caps Take 100 mg by mouth daily.   diclofenac 75 MG EC tablet Commonly known as:  VOLTAREN Take 75 mg by mouth 2 (two) times daily as needed. For cramps   HYDROcodone-acetaminophen 5-325 MG tablet Commonly known as:  NORCO/VICODIN Take 1-2 tablets by mouth every 4 (four) hours as needed for moderate pain.   ibuprofen 200 MG tablet Commonly known as:  ADVIL,MOTRIN Take 800 mg by mouth every 8 (eight) hours as needed (for headache).   levothyroxine 88 MCG tablet Commonly known as:  SYNTHROID Take 1 tablet (88 mcg total) by mouth daily before breakfast.   magnesium oxide 400 MG tablet Commonly known as:  MAG-OX Take 400 mg by mouth daily.   metFORMIN 500 MG tablet Commonly known as:  GLUCOPHAGE Take 1 tablet (500 mg total) by mouth 1 day or 1 dose. What changed:  how much to take  when to take this   metoprolol 50 MG  tablet Commonly known as:  LOPRESSOR TAKE 1 TABLET BY MOUTH ONCE DAILY   nortriptyline 10 MG capsule Commonly known as:  PAMELOR Take 1 capsule (10 mg total) by mouth at bedtime.   promethazine 12.5 MG tablet Commonly known as:  PHENERGAN Take 1 tablet (12.5 mg total) by mouth every 8 (eight) hours as needed for nausea or vomiting.   riboflavin 100 MG Tabs tablet Commonly known as:  VITAMIN B-2 Take 100 mg by mouth daily.   SUMAtriptan 6 MG/0.5ML Soln injection Commonly known as:  IMITREX Inject 0.5 mg into skin at earliest onset of headache. May repeat in 2 hours if headache persists or recurs.   SUMAtriptan 100 MG tablet Commonly known as:  IMITREX Take 100 mg by mouth daily as needed. For migraine headache.   Vitamin D (Ergocalciferol) 50000 units Caps capsule Commonly known as:  DRISDOL Take 1 capsule (50,000 Units total) by mouth every 7 (seven) days.  Earnstine Regal, MD, [facility] Office: [phone]   Signed: Earnstine Regal 11/19/2016, 7:42 AM

## 2016-11-21 NOTE — Progress Notes (Signed)
Please contact patient and notify of benign pathology results.  Malillany Kazlauskas M. Tida Saner, MD, FACS [facility] Office: [phone]   

## 2016-11-24 ENCOUNTER — Ambulatory Visit (INDEPENDENT_AMBULATORY_CARE_PROVIDER_SITE_OTHER): Payer: 59 | Admitting: Family Medicine

## 2016-11-24 VITALS — BP 144/86 | HR 105 | Temp 98.9°F | Ht 64.0 in | Wt 237.0 lb

## 2016-11-24 DIAGNOSIS — R7303 Prediabetes: Secondary | ICD-10-CM | POA: Diagnosis not present

## 2016-11-24 DIAGNOSIS — E559 Vitamin D deficiency, unspecified: Secondary | ICD-10-CM

## 2016-11-24 MED ORDER — VITAMIN D (ERGOCALCIFEROL) 1.25 MG (50000 UNIT) PO CAPS: 50000.0000 [IU] | ORAL_CAPSULE | ORAL | 0 refills | Status: DC

## 2016-11-24 MED ORDER — METFORMIN HCL 500 MG PO TABS: 500.0000 mg | ORAL_TABLET | ORAL | 0 refills | Status: DC

## 2016-11-24 MED FILL — VIT D2 1.25 MG (50,000 UNIT: 1.25 MG | 28 days supply | Qty: 4 | Fill #0

## 2016-11-24 NOTE — Progress Notes (Signed)
Office: [phone]  /  Fax: [phone]   HPI:   Chief Complaint: OBESITY [name] is here to discuss her progress with her obesity treatment plan. She is following her eating plan approximately 50 % of the time and states she is exercising 0 minutes 0 times per week. [name] has done well with weight loss efforts.  [name] is currently struggling with getting off track with diet while having surgery. She is not fully following a structured plan but mostly portion control/smart choices. Her weight is 237 lb (107.5 kg) today and has had a weight loss of 7 pounds over a period of 2 weeks since her last visit. She has lost 12 lbs since starting treatment with [name].  Pre-Diabetes [name] has a diagnosis of prediabetes based on her elevated HgA1c and was informed this puts her at greater risk of developing diabetes. She is taking metformin currently and continues to work on diet and exercise to decrease risk of diabetes. She notes decreased polyphagia on Metformin. She did note some nausea and diarrhea with poor food choices but no problems when she ate properly. She denies hypoglycemia.  Vitamin D deficiency [name] has a diagnosis of vitamin D deficiency. She is currently taking vitamin D but she is not yet at goal she denies vomiting or muscle weakness.  Wt Readings from Last 500 Encounters:  11/24/16 237 lb (107.5 kg)  11/19/16 249 lb 1.9 oz (113 kg)  11/13/16 249 lb 12.5 oz (113.3 kg)  11/10/16 244 lb (110.7 kg)  10/22/16 241 lb (109.3 kg)  10/08/16 254 lb (115.2 kg)  10/06/16 249 lb (112.9 kg)  09/24/16 252 lb (114.3 kg)  09/01/16 249 lb (112.9 kg)  07/18/16 247 lb 3.2 oz (112.1 kg)  06/24/16 243 lb (110.2 kg)  03/11/16 235 lb 12.8 oz (107 kg)  02/19/16 237 lb (107.5 kg)  10/17/15 248 lb (112.5 kg)  10/16/15 246 lb 9.6 oz (111.9 kg)  09/12/15 246 lb 9.6 oz (111.9 kg)  08/28/15 248 lb (112.5 kg)  08/01/15 251 lb (113.9 kg)  06/18/15 246 lb (111.6 kg)  01/10/15 242 lb (109.8 kg)    12/08/14 242 lb (109.8 kg)  10/30/14 236 lb 11.2 oz (107.4 kg)  04/12/14 234 lb 12.8 oz (106.5 kg)  12/09/13 251 lb (113.9 kg)  11/30/13 250 lb (113.4 kg)  11/10/13 254 lb (115.2 kg)  06/28/13 240 lb (108.9 kg)  04/19/13 234 lb (106.1 kg)  03/16/13 226 lb (102.5 kg)  02/25/13 232 lb (105.2 kg)  11/16/12 229 lb (103.9 kg)  09/20/12 242 lb (109.8 kg)  12/15/11 238 lb (108 kg)  08/14/11 242 lb (109.8 kg)  04/18/10 (!) 240 lb (108.9 kg)  03/14/10 (!) 239 lb (108.4 kg)     ALLERGIES: Allergies  Allergen Reactions  . No Known Allergies     MEDICATIONS: Current Outpatient Prescriptions on File Prior to Visit  Medication Sig Dispense Refill  . acetaminophen (TYLENOL) 500 MG tablet Take 2 tablets (1,000 mg total) by mouth every 6 (six) hours as needed for headache. 30 tablet 0  . calcium carbonate (TUMS) 500 MG chewable tablet Chew 2 tablets (400 mg of elemental calcium total) by mouth 2 (two) times daily. 90 tablet 1  . Coenzyme Q10 (COQ10) 100 MG CAPS Take 100 mg by mouth daily.    . diclofenac (VOLTAREN) 75 MG EC tablet Take 75 mg by mouth 2 (two) times daily as needed. For cramps    . HYDROcodone-acetaminophen (NORCO/VICODIN) 5-325 MG tablet Take 1-2  tablets by mouth every 4 (four) hours as needed for moderate pain. 30 tablet 0  . ibuprofen (ADVIL,MOTRIN) 200 MG tablet Take 800 mg by mouth every 8 (eight) hours as needed (for headache).    Marland Kitchen levothyroxine (SYNTHROID) 88 MCG tablet Take 1 tablet (88 mcg total) by mouth daily before breakfast. 30 tablet 3  . magnesium oxide (MAG-OX) 400 MG tablet Take 400 mg by mouth daily.    . metFORMIN (GLUCOPHAGE) 500 MG tablet Take 1 tablet (500 mg total) by mouth 1 day or 1 dose. (Patient taking differently: Take 250 mg by mouth at bedtime. ) 30 tablet 0  . metoprolol (LOPRESSOR) 50 MG tablet TAKE 1 TABLET BY MOUTH ONCE DAILY 90 tablet 1  . nortriptyline (PAMELOR) 10 MG capsule Take 1 capsule (10 mg total) by mouth at bedtime. 90 capsule 5  .  promethazine (PHENERGAN) 12.5 MG tablet Take 1 tablet (12.5 mg total) by mouth every 8 (eight) hours as needed for nausea or vomiting. 30 tablet 3  . riboflavin (VITAMIN B-2) 100 MG TABS tablet Take 100 mg by mouth daily.    . SUMAtriptan (IMITREX) 100 MG tablet Take 100 mg by mouth daily as needed. For migraine headache.    . SUMAtriptan (IMITREX) 6 MG/0.5ML SOLN injection Inject 0.5 mg into skin at earliest onset of headache. May repeat in 2 hours if headache persists or recurs. 4.5 mL 3  . Vitamin D, Ergocalciferol, (DRISDOL) 50000 units CAPS capsule Take 1 capsule (50,000 Units total) by mouth every 7 (seven) days. 4 capsule 0   No current facility-administered medications on file prior to visit.     PAST MEDICAL HISTORY: Past Medical History:  Diagnosis Date  . 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 Fibroids   . Headache(784.0) 06/14/2007   Dr. Tomi Likens seeing pt. for HA management   . High blood pressure   . HYPERTENSION 06/14/2007  . Migraines   . Nodular goiter   . Obesity   . Vitamin D deficiency     PAST SURGICAL HISTORY: Past Surgical History:  Procedure Laterality Date  . [place]   x2, with epidural & general anesth.   Marland Kitchen DILATION AND CURETTAGE OF UTERUS     x2  . THYROIDECTOMY  11/18/2016  . THYROIDECTOMY N/A 11/18/2016   Procedure: TOTAL THYROIDECTOMY;  Surgeon: Armandina Gemma, MD;  Location: [facility];  Service: General;  Laterality: N/A;    SOCIAL HISTORY: Social History  Substance Use Topics  . Smoking status: Never Smoker  . Smokeless tobacco: Never Used  . Alcohol use No    FAMILY HISTORY: Family History  Problem Relation Age of Onset  . Diabetes Mother   . Hypertension Mother   . Thyroid disease Mother     ROS: Review of Systems  Constitutional: Positive for weight loss.  Gastrointestinal: Positive for diarrhea and nausea. Negative for vomiting.  Musculoskeletal:       Negative muscle weakness  Endo/Heme/Allergies:        Polyphagia Negative hypoglycemia    PHYSICAL EXAM: Blood pressure (!) 144/86, pulse (!) 105, temperature 98.9 F (37.2 C), temperature source Oral, height 5\' 4"  (1.626 m), weight 237 lb (107.5 kg), last menstrual period 11/21/2016, SpO2 98 %. Body mass index is 40.68 kg/m. Physical Exam  Constitutional: She is oriented to person, place, and time. She appears well-developed and well-nourished.  Cardiovascular: Normal rate.   Pulmonary/Chest: Effort normal.  Musculoskeletal: Normal range of motion.  Neurological: She is oriented to person, place,  and time.  Skin: Skin is warm and dry.  Psychiatric: She has a normal mood and affect. Her behavior is normal.  Vitals reviewed.   RECENT LABS AND TESTS: BMET    Component Value Date/Time   NA 136 11/19/2016 0521   NA 140 10/06/2016 1428   K 4.4 11/19/2016 0521   CL 106 11/19/2016 0521   CO2 25 11/19/2016 0521   GLUCOSE 123 (H) 11/19/2016 0521   BUN 8 11/19/2016 0521   BUN 8 10/06/2016 1428   CREATININE 0.83 11/19/2016 0521   CREATININE 0.73 09/05/2016 0900   CALCIUM 9.2 11/19/2016 0521   GFRNONAA >60 11/19/2016 0521   GFRAA >60 11/19/2016 0521   Lab Results  Component Value Date   HGBA1C 6.0 (H) 10/06/2016   Lab Results  Component Value Date   INSULIN 44.8 (H) 10/06/2016   CBC    Component Value Date/Time   WBC 6.1 11/13/2016 0925   RBC 5.13 (H) 11/13/2016 0925   HGB 13.0 11/13/2016 0925   HCT 38.5 11/13/2016 0925   HCT 39.2 10/06/2016 1428   PLT 279 11/13/2016 0925   MCV 75.0 (L) 11/13/2016 0925   MCV 77 (L) 10/06/2016 1428   MCH 25.3 (L) 11/13/2016 0925   MCHC 33.8 11/13/2016 0925   RDW 14.2 11/13/2016 0925   RDW 15.0 10/06/2016 1428   LYMPHSABS 2.1 10/06/2016 1428   MONOABS 0.5 06/18/2015 0953   EOSABS 0.1 10/06/2016 1428   BASOSABS 0.0 10/06/2016 1428   Iron/TIBC/Ferritin/ %Sat No results found for: IRON, TIBC, FERRITIN, IRONPCTSAT Lipid Panel     Component Value Date/Time   CHOL 130 10/06/2016 1428    TRIG 95 10/06/2016 1428   HDL 43 10/06/2016 1428   CHOLHDL 3.3 09/05/2016 0900   VLDL 21 09/05/2016 0900   LDLCALC 68 10/06/2016 1428   Hepatic Function Panel     Component Value Date/Time   PROT 6.5 10/06/2016 1428   ALBUMIN 3.7 10/06/2016 1428   AST 20 10/06/2016 1428   ALT 19 10/06/2016 1428   ALKPHOS 100 10/06/2016 1428   BILITOT <0.2 10/06/2016 1428   BILIDIR 0.0 11/28/2013 0857      Component Value Date/Time   TSH 2.01 10/06/2016 0904   TSH 1.08 09/05/2016 0900   TSH 2.09 07/15/2016 0804    ASSESSMENT AND PLAN: Prediabetes - Plan: metFORMIN (GLUCOPHAGE) 500 MG tablet  Vitamin D deficiency - Plan: Vitamin D, Ergocalciferol, (DRISDOL) 50000 units CAPS capsule  Morbid obesity ([place])  PLAN:  Pre-Diabetes Shigeko will continue to work on weight loss, exercise, and decreasing simple carbohydrates in her diet to help decrease the risk of diabetes. We dicussed metformin including benefits and risks. She was informed that eating too many simple carbohydrates or too many calories at one sitting increases the likelihood of GI side effects. We will refill Metformin for 1 month. Jamarie agreed to follow up with [name] as directed to monitor her progress.  Vitamin D Deficiency Petina was informed that low vitamin D levels contributes to fatigue and are associated with obesity, breast, and colon cancer. She agrees to continue to take prescription Vit D @50 ,000 IU every week and will follow up for routine testing of vitamin D, at least 2-3 times per year. She was informed of the risk of over-replacement of vitamin D and agrees to not increase her dose unless he discusses this with [name] first.  Obesity Jamyla is currently in the action stage of change. As such, her goal is to continue with weight loss  efforts She has agreed to keep a food journal with 1200-1400 calories and 70 grams of protein daily [place] has been instructed to work up to a goal of 150 minutes of combined cardio and  strengthening exercise per week for weight loss and overall health benefits. We discussed the following Behavioral Modification Stratagies today: increasing lean protein intake, decreasing simple carbohydrates , increasing lower sugar fruits and work on meal planning, easy cooking plans and not skipping meals.   Elodie has agreed to follow up with our clinic in 2 weeks. She was informed of the importance of frequent follow up visits to maximize her success with intensive lifestyle modifications for her multiple health conditions.  I, Doreene Nest, am acting as scribe for Dennard Nip, MD  I have reviewed the above documentation for accuracy and completeness, and I agree with the above. -Dennard Nip, MD

## 2016-11-25 ENCOUNTER — Ambulatory Visit (INDEPENDENT_AMBULATORY_CARE_PROVIDER_SITE_OTHER): Payer: 59 | Admitting: Family Medicine

## 2016-11-25 MED FILL — metFORMIN HCL 500 MG TABS: 500 | 30 days supply | Qty: 30 | Fill #0

## 2016-11-28 NOTE — Progress Notes (Signed)
Please contact patient and notify of benign pathology results.  Keylah Darwish M. Nethaniel Mattie, MD, FACS [facility] Office: [phone]   

## 2016-12-02 ENCOUNTER — Telehealth: Payer: Self-pay | Admitting: Neurology

## 2016-12-02 MED FILL — SUMATRIPTAN 6 MG/0.5 ML INJ: 6 | 28 days supply | Qty: 2 | Fill #0

## 2016-12-02 MED FILL — SUMATRIPTAN SUCC 100 MG TAB: 100 | 30 days supply | Qty: 9 | Fill #2 | Status: TO

## 2016-12-02 NOTE — Telephone Encounter (Signed)
Spoke to patient. She states HA's have increased. Increased after Thyroidectomy. Says surgeon advised HA's not related to surgery. Patient taken nortriptyline 10mg  @ bedtime; taken OTC for mild HA, but did not try sumatriptan [place] for severe migraine because she says she did not want to sleep all day. Advised patient to try sumatriptan [place] as directed in last OV plan. Patient agreed and verbalized understanding.

## 2016-12-02 NOTE — Telephone Encounter (Signed)
[name] Her # [id number]. She would like you to call her regarding her migraines. Thank you

## 2016-12-08 ENCOUNTER — Ambulatory Visit (INDEPENDENT_AMBULATORY_CARE_PROVIDER_SITE_OTHER): Payer: 59 | Admitting: Family Medicine

## 2016-12-08 VITALS — BP 147/88 | HR 101 | Temp 98.7°F | Ht 64.0 in | Wt 240.0 lb

## 2016-12-08 DIAGNOSIS — I10 Essential (primary) hypertension: Secondary | ICD-10-CM | POA: Diagnosis not present

## 2016-12-08 DIAGNOSIS — E559 Vitamin D deficiency, unspecified: Secondary | ICD-10-CM

## 2016-12-08 MED ORDER — METOPROLOL TARTRATE 50 MG PO TABS: 50.0000 mg | ORAL_TABLET | Freq: Two times a day (BID) | ORAL | 0 refills | Status: DC

## 2016-12-08 MED ORDER — VITAMIN D (ERGOCALCIFEROL) 1.25 MG (50000 UNIT) PO CAPS: 50000.0000 [IU] | ORAL_CAPSULE | ORAL | 0 refills | Status: DC

## 2016-12-08 NOTE — Progress Notes (Signed)
Office: [phone]  /  Fax: [phone]   HPI:   Chief Complaint: OBESITY [name] is here to discuss her progress with her obesity treatment plan. She is following her eating plan approximately 0 % of the time and is following low carb eating plan and states she is exercising 0 minutes 0 times per week. Anyely is retaining fluid today. She had some increased temptations, increased cravings and emotional eating. She would like to go back to journaling because she did better with that plan. Her weight is 240 lb (108.9 kg) today and has had a weight gain of 3 lbs over a period of 2 weeks since her last visit. She has lost 9 lbs since starting treatment with [name].  Hypertension [name] is a 51 y.o. female with hypertension.  [name] denies chest pain or shortness of breath on exertion. She is working weight loss to help control her blood pressure with the goal of decreasing her risk of heart attack and stroke. [name] blood pressure is not currently controlled.  Vitamin D deficiency [name] has a diagnosis of vitamin D deficiency. She is currently taking vit D and not yet at goal. She admits fatigue and denies nausea, vomiting or muscle weakness.  Wt Readings from Last 500 Encounters:  12/08/16 240 lb (108.9 kg)  11/24/16 237 lb (107.5 kg)  11/19/16 249 lb 1.9 oz (113 kg)  11/13/16 249 lb 12.5 oz (113.3 kg)  11/10/16 244 lb (110.7 kg)  10/22/16 241 lb (109.3 kg)  10/08/16 254 lb (115.2 kg)  10/06/16 249 lb (112.9 kg)  09/24/16 252 lb (114.3 kg)  09/01/16 249 lb (112.9 kg)  07/18/16 247 lb 3.2 oz (112.1 kg)  06/24/16 243 lb (110.2 kg)  03/11/16 235 lb 12.8 oz (107 kg)  02/19/16 237 lb (107.5 kg)  10/17/15 248 lb (112.5 kg)  10/16/15 246 lb 9.6 oz (111.9 kg)  09/12/15 246 lb 9.6 oz (111.9 kg)  08/28/15 248 lb (112.5 kg)  08/01/15 251 lb (113.9 kg)  06/18/15 246 lb (111.6 kg)  01/10/15 242 lb (109.8 kg)  12/08/14 242 lb (109.8 kg)  10/30/14 236 lb 11.2 oz (107.4  kg)  04/12/14 234 lb 12.8 oz (106.5 kg)  12/09/13 251 lb (113.9 kg)  11/30/13 250 lb (113.4 kg)  11/10/13 254 lb (115.2 kg)  06/28/13 240 lb (108.9 kg)  04/19/13 234 lb (106.1 kg)  03/16/13 226 lb (102.5 kg)  02/25/13 232 lb (105.2 kg)  11/16/12 229 lb (103.9 kg)  09/20/12 242 lb (109.8 kg)  12/15/11 238 lb (108 kg)  08/14/11 242 lb (109.8 kg)  04/18/10 (!) 240 lb (108.9 kg)  03/14/10 (!) 239 lb (108.4 kg)     ALLERGIES: Allergies  Allergen Reactions   No Known Allergies     MEDICATIONS: Current Outpatient Prescriptions on File Prior to Visit  Medication Sig Dispense Refill   acetaminophen (TYLENOL) 500 MG tablet Take 2 tablets (1,000 mg total) by mouth every 6 (six) hours as needed for headache. 30 tablet 0   calcium carbonate (TUMS) 500 MG chewable tablet Chew 2 tablets (400 mg of elemental calcium total) by mouth 2 (two) times daily. 90 tablet 1   Coenzyme Q10 (COQ10) 100 MG CAPS Take 100 mg by mouth daily.     diclofenac (VOLTAREN) 75 MG EC tablet Take 75 mg by mouth 2 (two) times daily as needed. For cramps     HYDROcodone-acetaminophen (NORCO/VICODIN) 5-325 MG tablet Take 1-2 tablets by mouth every 4 (four)  hours as needed for moderate pain. 30 tablet 0   ibuprofen (ADVIL,MOTRIN) 200 MG tablet Take 800 mg by mouth every 8 (eight) hours as needed (for headache).     levothyroxine (SYNTHROID) 88 MCG tablet Take 1 tablet (88 mcg total) by mouth daily before breakfast. 30 tablet 3   magnesium oxide (MAG-OX) 400 MG tablet Take 400 mg by mouth daily.     metFORMIN (GLUCOPHAGE) 500 MG tablet Take 1 tablet (500 mg total) by mouth 1 day or 1 dose. 30 tablet 0   nortriptyline (PAMELOR) 10 MG capsule Take 1 capsule (10 mg total) by mouth at bedtime. 90 capsule 5   promethazine (PHENERGAN) 12.5 MG tablet Take 1 tablet (12.5 mg total) by mouth every 8 (eight) hours as needed for nausea or vomiting. 30 tablet 3   riboflavin (VITAMIN B-2) 100 MG TABS tablet Take 100 mg by  mouth daily.     SUMAtriptan (IMITREX) 100 MG tablet Take 100 mg by mouth daily as needed. For migraine headache.     SUMAtriptan (IMITREX) 6 MG/0.5ML SOLN injection Inject 0.5 mg into skin at earliest onset of headache. May repeat in 2 hours if headache persists or recurs. 4.5 mL 3   Vitamin D, Ergocalciferol, (DRISDOL) 50000 units CAPS capsule Take 1 capsule (50,000 Units total) by mouth every 7 (seven) days. 4 capsule 0   No current facility-administered medications on file prior to visit.     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 Tomi Likens seeing pt. for HA management    High blood pressure    HYPERTENSION 06/14/2007   Migraines    Nodular goiter    Obesity    Vitamin D deficiency     PAST SURGICAL HISTORY: Past Surgical History:  Procedure Laterality Date   [place]   x2, with epidural & general anesth.    DILATION AND CURETTAGE OF UTERUS     x2   THYROIDECTOMY  11/18/2016   THYROIDECTOMY N/A 11/18/2016   Procedure: TOTAL THYROIDECTOMY;  Surgeon: Armandina Gemma, MD;  Location: [facility];  Service: General;  Laterality: N/A;    SOCIAL HISTORY: Social History  Substance Use Topics   Smoking status: Never Smoker   Smokeless tobacco: Never Used   Alcohol use No    FAMILY HISTORY: Family History  Problem Relation Age of Onset   Diabetes Mother    Hypertension Mother    Thyroid disease Mother     ROS: Review of Systems  Constitutional: Positive for malaise/fatigue. Negative for weight loss.  Respiratory: Negative for shortness of breath (on exertion).   Cardiovascular: Negative for chest pain.  Gastrointestinal: Negative for nausea and vomiting.  Musculoskeletal:       Negative muscle weakness  Neurological: Negative for headaches.  Endo/Heme/Allergies:       Polyphagia    PHYSICAL EXAM: Blood pressure (!) 147/88, pulse (!) 101,  temperature 98.7 F (37.1 C), temperature source Oral, height 5\' 4"  (1.626 m), weight 240 lb (108.9 kg), last menstrual period 11/21/2016, SpO2 97 %. Body mass index is 41.2 kg/m. Physical Exam  Constitutional: She is oriented to person, place, and time. She appears well-nourished.  Cardiovascular: Normal rate.   Pulmonary/Chest: Effort normal.  Musculoskeletal: Normal range of motion.  Neurological: She is oriented to person, place, and time.  Skin: Skin is warm and dry.  Psychiatric: She has a normal mood and  affect. Her behavior is normal.  Vitals reviewed.   RECENT LABS AND TESTS: BMET    Component Value Date/Time   NA 136 11/19/2016 0521   NA 140 10/06/2016 1428   K 4.4 11/19/2016 0521   CL 106 11/19/2016 0521   CO2 25 11/19/2016 0521   GLUCOSE 123 (H) 11/19/2016 0521   BUN 8 11/19/2016 0521   BUN 8 10/06/2016 1428   CREATININE 0.83 11/19/2016 0521   CREATININE 0.73 09/05/2016 0900   CALCIUM 9.2 11/19/2016 0521   GFRNONAA >60 11/19/2016 0521   GFRAA >60 11/19/2016 0521   Lab Results  Component Value Date   HGBA1C 6.0 (H) 10/06/2016   Lab Results  Component Value Date   INSULIN 44.8 (H) 10/06/2016   CBC    Component Value Date/Time   WBC 6.1 11/13/2016 0925   RBC 5.13 (H) 11/13/2016 0925   HGB 13.0 11/13/2016 0925   HCT 38.5 11/13/2016 0925   HCT 39.2 10/06/2016 1428   PLT 279 11/13/2016 0925   MCV 75.0 (L) 11/13/2016 0925   MCV 77 (L) 10/06/2016 1428   MCH 25.3 (L) 11/13/2016 0925   MCHC 33.8 11/13/2016 0925   RDW 14.2 11/13/2016 0925   RDW 15.0 10/06/2016 1428   LYMPHSABS 2.1 10/06/2016 1428   MONOABS 0.5 06/18/2015 0953   EOSABS 0.1 10/06/2016 1428   BASOSABS 0.0 10/06/2016 1428   Iron/TIBC/Ferritin/ %Sat No results found for: IRON, TIBC, FERRITIN, IRONPCTSAT Lipid Panel     Component Value Date/Time   CHOL 130 10/06/2016 1428   TRIG 95 10/06/2016 1428   HDL 43 10/06/2016 1428   CHOLHDL 3.3 09/05/2016 0900   VLDL 21 09/05/2016 0900    LDLCALC 68 10/06/2016 1428   Hepatic Function Panel     Component Value Date/Time   PROT 6.5 10/06/2016 1428   ALBUMIN 3.7 10/06/2016 1428   AST 20 10/06/2016 1428   ALT 19 10/06/2016 1428   ALKPHOS 100 10/06/2016 1428   BILITOT <0.2 10/06/2016 1428   BILIDIR 0.0 11/28/2013 0857      Component Value Date/Time   TSH 2.01 10/06/2016 0904   TSH 1.08 09/05/2016 0900   TSH 2.09 07/15/2016 0804    ASSESSMENT AND PLAN: Vitamin D deficiency - Plan: Vitamin D, Ergocalciferol, (DRISDOL) 50000 units CAPS capsule  Essential hypertension - Plan: metoprolol (LOPRESSOR) 50 MG tablet  Morbid obesity ([facility])  PLAN:  Hypertension [name]'s blood pressure is elevated today for the second time in a row. She denies headache or chest pain on short acting Metoprolol 1 time a day, which may explain her elevated blood pressure. We discussed sodium restriction, working on healthy weight loss, and a regular exercise program as the means to achieve improved blood pressure control. [name] agreed with this plan and agreed to follow up as directed. We will continue to monitor her blood pressure as well as her progress with the above lifestyle modifications. She agrees to increase Metoprolol to 50 mg 2 times a day, no refills needed at this time and we will re-check blood pressure in 2 weeks and will watch for signs of hypotension as she continues her lifestyle modifications. She was advised, we cannot start obesity medicines until blood pressure has improved.  Vitamin D Deficiency [name] was informed that low vitamin D levels contributes to fatigue and are associated with obesity, breast, and colon cancer. She agrees to continue to take prescription Vit D @50 ,000 IU every week and will follow up for routine testing of vitamin D, at  least 2-3 times per year. She was informed of the risk of over-replacement of vitamin D and agrees to not increase her dose unless he discusses this with [name] first.  Obesity [name] is  currently in the action stage of change. As such, her goal is to continue with weight loss efforts She has agreed to keep a food journal with 1200 to 1400 calories and 70+ grams protein daily [name] has been instructed to work up to a goal of 150 minutes of combined cardio and strengthening exercise per week for weight loss and overall health benefits. We discussed the following Behavioral Modification Stratagies today: increasing lean protein intake, decreasing simple carbohydrates , increasing vegetables and dealing with family or coworker sabotage  [name] has agreed to follow up with our clinic in 2 weeks. She was informed of the importance of frequent follow up visits to maximize her success with intensive lifestyle modifications for her multiple health conditions.  I, Doreene Nest, am acting as scribe for Dennard Nip, MD  I have reviewed the above documentation for accuracy and completeness, and I agree with the above. -Dennard Nip, MD

## 2016-12-22 MED FILL — LEVOTHYROXINE 88 MCG TABLET: 88 | 30 days supply | Qty: 30 | Fill #1

## 2016-12-23 ENCOUNTER — Ambulatory Visit (INDEPENDENT_AMBULATORY_CARE_PROVIDER_SITE_OTHER): Payer: 59 | Admitting: Family Medicine

## 2016-12-23 VITALS — BP 135/86 | HR 73 | Temp 98.7°F | Ht 64.0 in | Wt 242.0 lb

## 2016-12-23 DIAGNOSIS — F329 Major depressive disorder, single episode, unspecified: Secondary | ICD-10-CM | POA: Insufficient documentation

## 2016-12-23 DIAGNOSIS — F3289 Other specified depressive episodes: Secondary | ICD-10-CM | POA: Diagnosis not present

## 2016-12-23 DIAGNOSIS — F32A Depression, unspecified: Secondary | ICD-10-CM | POA: Insufficient documentation

## 2016-12-23 MED ORDER — BUPROPION HCL ER (SR) 150 MG PO TB12: 150.0000 mg | ORAL_TABLET | Freq: Every morning | ORAL | 0 refills | Status: DC

## 2016-12-23 MED FILL — BUPROPION SR 150 MG TABLET: 150 | 30 days supply | Qty: 30 | Fill #0

## 2016-12-23 NOTE — Progress Notes (Signed)
Office: [phone]  /  Fax: [phone]   HPI:   Chief Complaint: OBESITY [name] is here to discuss her progress with her obesity treatment plan. She is following her eating plan approximately 60 % of the time and states she is exercising 0 minutes 0 times per week. [name] is off track with diet, mostly portion control/smart choices. She is struggling with motivation after surgery. She would like advice on how to get through this.  Her weight is 242 lb (109.8 kg) today and has had a weight gain of 2 lbs over a period of 2 weeks since her last visit. She has lost 0 lbs since starting treatment with [name].  Depression with emotional eating behaviors [name] notes increased emotional eating over the past 2 to 3 weeks, depressed mood, increased frustration and using food for comfort to the extent that it is negatively impacting her health. She often snacks when she is not hungry. [name] sometimes feels she is out of control and then feels guilty that she made poor food choices. She shows no sign of suicidal or homicidal ideations.  Depression screen [facility] 2/9 10/06/2016 10/16/2015 12/09/2013  Decreased Interest 2 0 0  Down, Depressed, Hopeless 1 0 0  PHQ - 2 Score 3 0 0  Altered sleeping 0 - -  Tired, decreased energy 2 - -  Change in appetite 1 - -  Feeling bad or failure about yourself  1 - -  Trouble concentrating 0 - -  Moving slowly or fidgety/restless 0 - -  Suicidal thoughts 0 - -  PHQ-9 Score 7 - -  Difficult doing work/chores Not difficult at all - -      Wt Readings from Last 500 Encounters:  12/23/16 242 lb (109.8 kg)  12/08/16 240 lb (108.9 kg)  11/24/16 237 lb (107.5 kg)  11/19/16 249 lb 1.9 oz (113 kg)  11/13/16 249 lb 12.5 oz (113.3 kg)  11/10/16 244 lb (110.7 kg)  10/22/16 241 lb (109.3 kg)  10/08/16 254 lb (115.2 kg)  10/06/16 249 lb (112.9 kg)  09/24/16 252 lb (114.3 kg)  09/01/16 249 lb (112.9 kg)  07/18/16 247 lb 3.2 oz (112.1 kg)  06/24/16 243 lb (110.2 kg)   03/11/16 235 lb 12.8 oz (107 kg)  02/19/16 237 lb (107.5 kg)  10/17/15 248 lb (112.5 kg)  10/16/15 246 lb 9.6 oz (111.9 kg)  09/12/15 246 lb 9.6 oz (111.9 kg)  08/28/15 248 lb (112.5 kg)  08/01/15 251 lb (113.9 kg)  06/18/15 246 lb (111.6 kg)  01/10/15 242 lb (109.8 kg)  12/08/14 242 lb (109.8 kg)  10/30/14 236 lb 11.2 oz (107.4 kg)  04/12/14 234 lb 12.8 oz (106.5 kg)  12/09/13 251 lb (113.9 kg)  11/30/13 250 lb (113.4 kg)  11/10/13 254 lb (115.2 kg)  06/28/13 240 lb (108.9 kg)  04/19/13 234 lb (106.1 kg)  03/16/13 226 lb (102.5 kg)  02/25/13 232 lb (105.2 kg)  11/16/12 229 lb (103.9 kg)  09/20/12 242 lb (109.8 kg)  12/15/11 238 lb (108 kg)  08/14/11 242 lb (109.8 kg)  04/18/10 (!) 240 lb (108.9 kg)  03/14/10 (!) 239 lb (108.4 kg)     ALLERGIES: Allergies  Allergen Reactions  . No Known Allergies     MEDICATIONS: Current Outpatient Prescriptions on File Prior to Visit  Medication Sig Dispense Refill  . acetaminophen (TYLENOL) 500 MG tablet Take 2 tablets (1,000 mg total) by mouth every 6 (six) hours as needed for headache. 30 tablet 0  .  calcium carbonate (TUMS) 500 MG chewable tablet Chew 2 tablets (400 mg of elemental calcium total) by mouth 2 (two) times daily. 90 tablet 1  . Coenzyme Q10 (COQ10) 100 MG CAPS Take 100 mg by mouth daily.    . diclofenac (VOLTAREN) 75 MG EC tablet Take 75 mg by mouth 2 (two) times daily as needed. For cramps    . HYDROcodone-acetaminophen (NORCO/VICODIN) 5-325 MG tablet Take 1-2 tablets by mouth every 4 (four) hours as needed for moderate pain. 30 tablet 0  . ibuprofen (ADVIL,MOTRIN) 200 MG tablet Take 800 mg by mouth every 8 (eight) hours as needed (for headache).    Marland Kitchen levothyroxine (SYNTHROID) 88 MCG tablet Take 1 tablet (88 mcg total) by mouth daily before breakfast. 30 tablet 3  . magnesium oxide (MAG-OX) 400 MG tablet Take 400 mg by mouth daily.    . metFORMIN (GLUCOPHAGE) 500 MG tablet Take 1 tablet (500 mg total) by mouth 1 day  or 1 dose. 30 tablet 0  . metoprolol (LOPRESSOR) 50 MG tablet Take 1 tablet (50 mg total) by mouth 2 (two) times daily. 60 tablet 0  . nortriptyline (PAMELOR) 10 MG capsule Take 1 capsule (10 mg total) by mouth at bedtime. 90 capsule 5  . promethazine (PHENERGAN) 12.5 MG tablet Take 1 tablet (12.5 mg total) by mouth every 8 (eight) hours as needed for nausea or vomiting. 30 tablet 3  . riboflavin (VITAMIN B-2) 100 MG TABS tablet Take 100 mg by mouth daily.    . SUMAtriptan (IMITREX) 100 MG tablet Take 100 mg by mouth daily as needed. For migraine headache.    . SUMAtriptan (IMITREX) 6 MG/0.5ML SOLN injection Inject 0.5 mg into skin at earliest onset of headache. May repeat in 2 hours if headache persists or recurs. 4.5 mL 3  . Vitamin D, Ergocalciferol, (DRISDOL) 50000 units CAPS capsule Take 1 capsule (50,000 Units total) by mouth every 7 (seven) days. 4 capsule 0   No current facility-administered medications on file prior to visit.     PAST MEDICAL HISTORY: Past Medical History:  Diagnosis Date  . 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 Fibroids   . Headache(784.0) 06/14/2007   Dr. Tomi Likens seeing pt. for HA management   . High blood pressure   . HYPERTENSION 06/14/2007  . Migraines   . Nodular goiter   . Obesity   . Vitamin D deficiency     PAST SURGICAL HISTORY: Past Surgical History:  Procedure Laterality Date  . [place]   x2, with epidural & general anesth.   Marland Kitchen DILATION AND CURETTAGE OF UTERUS     x2  . THYROIDECTOMY  11/18/2016  . THYROIDECTOMY N/A 11/18/2016   Procedure: TOTAL THYROIDECTOMY;  Surgeon: Armandina Gemma, MD;  Location: [facility];  Service: General;  Laterality: N/A;    SOCIAL HISTORY: Social History  Substance Use Topics  . Smoking status: Never Smoker  . Smokeless tobacco: Never Used  . Alcohol use No    FAMILY HISTORY: Family History  Problem Relation Age of Onset  . Diabetes Mother   . Hypertension Mother   . Thyroid disease Mother      ROS: Review of Systems  Constitutional: Negative for weight loss.  Psychiatric/Behavioral: Positive for depression. Negative for suicidal ideas.    PHYSICAL EXAM: Blood pressure 135/86, pulse 73, temperature 98.7 F (37.1 C), temperature source Oral, height 5\' 4"  (1.626 m), weight 242 lb (109.8 kg), last menstrual period 12/20/2016, SpO2 97 %. Body mass index is  41.54 kg/m. Physical Exam  Constitutional: She is oriented to person, place, and time. She appears well-developed and well-nourished.  Cardiovascular: Normal rate.   Pulmonary/Chest: Effort normal.  Neurological: She is oriented to person, place, and time.  Skin: Skin is warm and dry.  Vitals reviewed.   RECENT LABS AND TESTS: BMET    Component Value Date/Time   NA 136 11/19/2016 0521   NA 140 10/06/2016 1428   K 4.4 11/19/2016 0521   CL 106 11/19/2016 0521   CO2 25 11/19/2016 0521   GLUCOSE 123 (H) 11/19/2016 0521   BUN 8 11/19/2016 0521   BUN 8 10/06/2016 1428   CREATININE 0.83 11/19/2016 0521   CREATININE 0.73 09/05/2016 0900   CALCIUM 9.2 11/19/2016 0521   GFRNONAA >60 11/19/2016 0521   GFRAA >60 11/19/2016 0521   Lab Results  Component Value Date   HGBA1C 6.0 (H) 10/06/2016   Lab Results  Component Value Date   INSULIN 44.8 (H) 10/06/2016   CBC    Component Value Date/Time   WBC 6.1 11/13/2016 0925   RBC 5.13 (H) 11/13/2016 0925   HGB 13.0 11/13/2016 0925   HCT 38.5 11/13/2016 0925   HCT 39.2 10/06/2016 1428   PLT 279 11/13/2016 0925   MCV 75.0 (L) 11/13/2016 0925   MCV 77 (L) 10/06/2016 1428   MCH 25.3 (L) 11/13/2016 0925   MCHC 33.8 11/13/2016 0925   RDW 14.2 11/13/2016 0925   RDW 15.0 10/06/2016 1428   LYMPHSABS 2.1 10/06/2016 1428   MONOABS 0.5 06/18/2015 0953   EOSABS 0.1 10/06/2016 1428   BASOSABS 0.0 10/06/2016 1428   Iron/TIBC/Ferritin/ %Sat No results found for: IRON, TIBC, FERRITIN, IRONPCTSAT Lipid Panel     Component Value Date/Time   CHOL 130 10/06/2016 1428    TRIG 95 10/06/2016 1428   HDL 43 10/06/2016 1428   CHOLHDL 3.3 09/05/2016 0900   VLDL 21 09/05/2016 0900   LDLCALC 68 10/06/2016 1428   Hepatic Function Panel     Component Value Date/Time   PROT 6.5 10/06/2016 1428   ALBUMIN 3.7 10/06/2016 1428   AST 20 10/06/2016 1428   ALT 19 10/06/2016 1428   ALKPHOS 100 10/06/2016 1428   BILITOT <0.2 10/06/2016 1428   BILIDIR 0.0 11/28/2013 0857      Component Value Date/Time   TSH 2.01 10/06/2016 0904   TSH 1.08 09/05/2016 0900   TSH 2.09 07/15/2016 0804    ASSESSMENT AND PLAN: Other depression - Plan: buPROPion (WELLBUTRIN SR) 150 MG 12 hr tablet  Morbid obesity (HCC)  PLAN:  Depression with Emotional Eating Behaviors We discussed behavior modification techniques today to help [name] deal with her emotional eating and depression. She has agreed to start to take Wellbutrin SR 150 mg qd #30 with no refills and agreed to follow up as directed.  Obesity [name] is currently in the action stage of change. As such, her goal is to continue with weight loss efforts She has agreed to follow the Category 2 plan [name] has been instructed to work up to a goal of 150 minutes of combined cardio and strengthening exercise per week for weight loss and overall health benefits. We discussed the following Behavioral Modification Stratagies today: increasing lean protein intake and increasing lower sugar fruits  [name] has agreed to follow up with our clinic in 2 weeks. She was informed of the importance of frequent follow up visits to maximize her success with intensive lifestyle modifications for her multiple health conditions.  Burnell Blanks  Valere Dross, am acting as Education administrator for Dennard Nip, MD  I have reviewed the above documentation for accuracy and completeness, and I agree with the above. -Dennard Nip, MD

## 2017-01-01 DIAGNOSIS — E042 Nontoxic multinodular goiter: Secondary | ICD-10-CM | POA: Diagnosis not present

## 2017-01-05 DIAGNOSIS — E042 Nontoxic multinodular goiter: Secondary | ICD-10-CM | POA: Diagnosis not present

## 2017-01-05 LAB — TSH: TSH: 11.7 u[IU]/mL — AB (ref 0.41–5.90)

## 2017-01-07 ENCOUNTER — Telehealth: Payer: Self-pay | Admitting: Endocrinology

## 2017-01-07 ENCOUNTER — Ambulatory Visit (INDEPENDENT_AMBULATORY_CARE_PROVIDER_SITE_OTHER): Payer: 59 | Admitting: Family Medicine

## 2017-01-07 VITALS — BP 139/87 | HR 83 | Temp 98.5°F | Ht 64.0 in | Wt 242.0 lb

## 2017-01-07 DIAGNOSIS — R7303 Prediabetes: Secondary | ICD-10-CM

## 2017-01-07 NOTE — Progress Notes (Signed)
Office: [phone]  /  Fax: [phone]   HPI:   Chief Complaint: OBESITY [name] is here to discuss her progress with her obesity treatment plan. She is following her eating plan approximately 50 to 60 % of the time and states she is exercising gym or walking 3 times per week. Shamika notes increased temptations and sabotage from husband. Increased eating out, increased cravings and feels less in control of her emotional eating. Her weight is 242 lb (109.8 kg) today and has maintained weight over a period of 2 weeks since her last visit. She has lost 7 lbs since starting treatment with [name].  Pre-Diabetes [name] has a diagnosis of prediabetes based on her elevated HgA1c and was informed this puts her at greater risk of developing diabetes. She is taking metformin currently, not taking metformin regularly, maybe only 2 to 3 times per week. She continues to work on diet and exercise to decrease risk of diabetes. She still notes polyphagia and denies nausea or hypoglycemia.   Wt Readings from Last 500 Encounters:  01/07/17 242 lb (109.8 kg)  12/23/16 242 lb (109.8 kg)  12/08/16 240 lb (108.9 kg)  11/24/16 237 lb (107.5 kg)  11/19/16 249 lb 1.9 oz (113 kg)  11/13/16 249 lb 12.5 oz (113.3 kg)  11/10/16 244 lb (110.7 kg)  10/22/16 241 lb (109.3 kg)  10/08/16 254 lb (115.2 kg)  10/06/16 249 lb (112.9 kg)  09/24/16 252 lb (114.3 kg)  09/01/16 249 lb (112.9 kg)  07/18/16 247 lb 3.2 oz (112.1 kg)  06/24/16 243 lb (110.2 kg)  03/11/16 235 lb 12.8 oz (107 kg)  02/19/16 237 lb (107.5 kg)  10/17/15 248 lb (112.5 kg)  10/16/15 246 lb 9.6 oz (111.9 kg)  09/12/15 246 lb 9.6 oz (111.9 kg)  08/28/15 248 lb (112.5 kg)  08/01/15 251 lb (113.9 kg)  06/18/15 246 lb (111.6 kg)  01/10/15 242 lb (109.8 kg)  12/08/14 242 lb (109.8 kg)  10/30/14 236 lb 11.2 oz (107.4 kg)  04/12/14 234 lb 12.8 oz (106.5 kg)  12/09/13 251 lb (113.9 kg)  11/30/13 250 lb (113.4 kg)  11/10/13 254 lb (115.2 kg)    06/28/13 240 lb (108.9 kg)  04/19/13 234 lb (106.1 kg)  03/16/13 226 lb (102.5 kg)  02/25/13 232 lb (105.2 kg)  11/16/12 229 lb (103.9 kg)  09/20/12 242 lb (109.8 kg)  12/15/11 238 lb (108 kg)  08/14/11 242 lb (109.8 kg)  04/18/10 (!) 240 lb (108.9 kg)  03/14/10 (!) 239 lb (108.4 kg)     ALLERGIES: Allergies  Allergen Reactions  . No Known Allergies     MEDICATIONS: Current Outpatient Prescriptions on File Prior to Visit  Medication Sig Dispense Refill  . acetaminophen (TYLENOL) 500 MG tablet Take 2 tablets (1,000 mg total) by mouth every 6 (six) hours as needed for headache. 30 tablet 0  . buPROPion (WELLBUTRIN SR) 150 MG 12 hr tablet Take 1 tablet (150 mg total) by mouth every morning. 30 tablet 0  . calcium carbonate (TUMS) 500 MG chewable tablet Chew 2 tablets (400 mg of elemental calcium total) by mouth 2 (two) times daily. 90 tablet 1  . Coenzyme Q10 (COQ10) 100 MG CAPS Take 100 mg by mouth daily.    . diclofenac (VOLTAREN) 75 MG EC tablet Take 75 mg by mouth 2 (two) times daily as needed. For cramps    . HYDROcodone-acetaminophen (NORCO/VICODIN) 5-325 MG tablet Take 1-2 tablets by mouth every 4 (four) hours as needed for  moderate pain. 30 tablet 0  . ibuprofen (ADVIL,MOTRIN) 200 MG tablet Take 800 mg by mouth every 8 (eight) hours as needed (for headache).    Marland Kitchen levothyroxine (SYNTHROID) 88 MCG tablet Take 1 tablet (88 mcg total) by mouth daily before breakfast. 30 tablet 3  . magnesium oxide (MAG-OX) 400 MG tablet Take 400 mg by mouth daily.    . metoprolol (LOPRESSOR) 50 MG tablet Take 1 tablet (50 mg total) by mouth 2 (two) times daily. 60 tablet 0  . nortriptyline (PAMELOR) 10 MG capsule Take 1 capsule (10 mg total) by mouth at bedtime. 90 capsule 5  . promethazine (PHENERGAN) 12.5 MG tablet Take 1 tablet (12.5 mg total) by mouth every 8 (eight) hours as needed for nausea or vomiting. 30 tablet 3  . riboflavin (VITAMIN B-2) 100 MG TABS tablet Take 100 mg by mouth daily.     . SUMAtriptan (IMITREX) 100 MG tablet Take 100 mg by mouth daily as needed. For migraine headache.    . SUMAtriptan (IMITREX) 6 MG/0.5ML SOLN injection Inject 0.5 mg into skin at earliest onset of headache. May repeat in 2 hours if headache persists or recurs. 4.5 mL 3  . Vitamin D, Ergocalciferol, (DRISDOL) 50000 units CAPS capsule Take 1 capsule (50,000 Units total) by mouth every 7 (seven) days. 4 capsule 0  . metFORMIN (GLUCOPHAGE) 500 MG tablet Take 1 tablet (500 mg total) by mouth 1 day or 1 dose. 30 tablet 0   No current facility-administered medications on file prior to visit.     PAST MEDICAL HISTORY: Past Medical History:  Diagnosis Date  . 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 Fibroids   . Headache(784.0) 06/14/2007   Dr. Tomi Likens seeing pt. for HA management   . High blood pressure   . HYPERTENSION 06/14/2007  . Migraines   . Nodular goiter   . Obesity   . Vitamin D deficiency     PAST SURGICAL HISTORY: Past Surgical History:  Procedure Laterality Date  . [place]   x2, with epidural & general anesth.   Marland Kitchen DILATION AND CURETTAGE OF UTERUS     x2  . THYROIDECTOMY  11/18/2016  . THYROIDECTOMY N/A 11/18/2016   Procedure: TOTAL THYROIDECTOMY;  Surgeon: Armandina Gemma, MD;  Location: [facility];  Service: General;  Laterality: N/A;    SOCIAL HISTORY: Social History  Substance Use Topics  . Smoking status: Never Smoker  . Smokeless tobacco: Never Used  . Alcohol use No    FAMILY HISTORY: Family History  Problem Relation Age of Onset  . Diabetes Mother   . Hypertension Mother   . Thyroid disease Mother     ROS: Review of Systems  Constitutional: Negative for weight loss.  Gastrointestinal: Negative for nausea.  Endo/Heme/Allergies:       Polyphagia Negative hypoglycemia    PHYSICAL EXAM: Blood pressure 139/87, pulse 83, temperature 98.5 F (36.9 C), temperature source Oral, height 5\' 4"  (1.626 m), weight 242 lb (109.8 kg), last menstrual period  12/20/2016, SpO2 98 %. Body mass index is 41.54 kg/m. Physical Exam  Constitutional: She is oriented to person, place, and time. She appears well-developed and well-nourished.  Cardiovascular: Normal rate.   Pulmonary/Chest: Effort normal.  Musculoskeletal: Normal range of motion.  Neurological: She is oriented to person, place, and time.  Skin: Skin is warm and dry.  Psychiatric: She has a normal mood and affect. Her behavior is normal.  Vitals reviewed.   RECENT LABS AND TESTS: BMET  Component Value Date/Time   NA 136 11/19/2016 0521   NA 140 10/06/2016 1428   K 4.4 11/19/2016 0521   CL 106 11/19/2016 0521   CO2 25 11/19/2016 0521   GLUCOSE 123 (H) 11/19/2016 0521   BUN 8 11/19/2016 0521   BUN 8 10/06/2016 1428   CREATININE 0.83 11/19/2016 0521   CREATININE 0.73 09/05/2016 0900   CALCIUM 9.2 11/19/2016 0521   GFRNONAA >60 11/19/2016 0521   GFRAA >60 11/19/2016 0521   Lab Results  Component Value Date   HGBA1C 6.0 (H) 10/06/2016   Lab Results  Component Value Date   INSULIN 44.8 (H) 10/06/2016   CBC    Component Value Date/Time   WBC 6.1 11/13/2016 0925   RBC 5.13 (H) 11/13/2016 0925   HGB 13.0 11/13/2016 0925   HCT 38.5 11/13/2016 0925   HCT 39.2 10/06/2016 1428   PLT 279 11/13/2016 0925   MCV 75.0 (L) 11/13/2016 0925   MCV 77 (L) 10/06/2016 1428   MCH 25.3 (L) 11/13/2016 0925   MCHC 33.8 11/13/2016 0925   RDW 14.2 11/13/2016 0925   RDW 15.0 10/06/2016 1428   LYMPHSABS 2.1 10/06/2016 1428   MONOABS 0.5 06/18/2015 0953   EOSABS 0.1 10/06/2016 1428   BASOSABS 0.0 10/06/2016 1428   Iron/TIBC/Ferritin/ %Sat No results found for: IRON, TIBC, FERRITIN, IRONPCTSAT Lipid Panel     Component Value Date/Time   CHOL 130 10/06/2016 1428   TRIG 95 10/06/2016 1428   HDL 43 10/06/2016 1428   CHOLHDL 3.3 09/05/2016 0900   VLDL 21 09/05/2016 0900   LDLCALC 68 10/06/2016 1428   Hepatic Function Panel     Component Value Date/Time   PROT 6.5 10/06/2016 1428    ALBUMIN 3.7 10/06/2016 1428   AST 20 10/06/2016 1428   ALT 19 10/06/2016 1428   ALKPHOS 100 10/06/2016 1428   BILITOT <0.2 10/06/2016 1428   BILIDIR 0.0 11/28/2013 0857      Component Value Date/Time   TSH 2.01 10/06/2016 0904   TSH 1.08 09/05/2016 0900   TSH 2.09 07/15/2016 0804    ASSESSMENT AND PLAN: Prediabetes  Morbid obesity ([place])  PLAN:  Pre-Diabetes Briannie was encouraged to get back to weight loss, exercise, and decreasing simple carbohydrates in her diet to help decrease the risk of diabetes. We dicussed metformin including benefits and risks. She was informed that eating too many simple carbohydrates or too many calories at one sitting increases the likelihood of GI side effects. Epsie encouraged to take medications daily and she agreed to follow up with [name] as directed to monitor her progress.  We spent > than 50% of the 15 minute visit on the counseling as documented in the note.  Obesity Camisha is currently in the action stage of change. As such, her goal is to continue with weight loss efforts She has agreed to follow the Category 2 plan Bert has been instructed to work up to a goal of 150 minutes of combined cardio and strengthening exercise per week for weight loss and overall health benefits. We discussed the following Behavioral Modification Stratagies today: increasing lean protein intake, decreasing simple carbohydrates , decrease eating out and dealing with family or coworker sabotage  Foye has agreed to follow up with our clinic in 2 weeks. She was informed of the importance of frequent follow up visits to maximize her success with intensive lifestyle modifications for her multiple health conditions.  Corey Skains, am acting as scribe for Dennard Nip, MD  I have reviewed the above documentation for accuracy and completeness, and I agree with the above. -Dennard Nip, MD

## 2017-01-07 NOTE — Telephone Encounter (Signed)
Dr Harlow Asa faxed over patient labs ans asked Dwyane Dee to keep up with patients Synthroid.

## 2017-01-08 NOTE — Telephone Encounter (Signed)
She needs to be seen for follow-up, please schedule ASAP

## 2017-01-08 NOTE — Telephone Encounter (Signed)
Requested the labs from Dr. Harlow Asa and set up an appt for the pt for tomorrow

## 2017-01-08 NOTE — Telephone Encounter (Signed)
Labs in dr Dwyane Dee has them

## 2017-01-09 ENCOUNTER — Encounter: Payer: Self-pay | Admitting: Endocrinology

## 2017-01-09 ENCOUNTER — Ambulatory Visit (INDEPENDENT_AMBULATORY_CARE_PROVIDER_SITE_OTHER): Payer: 59 | Admitting: Endocrinology

## 2017-01-09 VITALS — BP 158/98 | HR 105 | Ht 64.0 in | Wt 248.0 lb

## 2017-01-09 DIAGNOSIS — E890 Postprocedural hypothyroidism: Secondary | ICD-10-CM | POA: Diagnosis not present

## 2017-01-09 NOTE — Progress Notes (Signed)
Patient ID: [name], female   DOB: Mar 13, 1966, 51 y.o.   MRN: [id number]           Reason for Appointment: Follow-up of thyroid    History of Present Illness:   She is here for follow-up of her thyroid after having a total thyroidectomy in 11/18/16 This was done because of her complaining of increased pressure and choking sensation in her neck and some difficulty with swallowing  Thyroidectomy was successfully completed but she had some hoarseness subsequently which has improved She has now started feeling significantly tired especially in the last 2 weeks, is more tired in the day Has mild cold intolerance also She has been started on 88 g of levothyroxine after her thyroidectomy which she is taking regularly  Previous history: The patient's thyroid enlargement was first discovered in 03/2014 incidentally when the patient was at the urgent care center Her ultrasound showed a multinodular goiter  Since she had an increased size of the isthmus nodule this was evaluated with needle aspiration in 07/2016 and showed benign follicular adenoma .   Lab Results  Component Value Date   FREET4 0.72 10/06/2016   FREET4 0.69 07/15/2016   FREET4 0.72 03/11/2016   TSH 11.70 (A) 01/05/2017   TSH 2.01 10/06/2016   TSH 1.08 09/05/2016    She  had an ultrasound exam in 10/16 showed multiple nodules with the dominant solid nodule in the left isthmus measuring 2.8 cm, previously 2.6.    Other nodules were the same or minimally changed    Wt Readings from Last 3 Encounters:  01/09/17 248 lb (112.5 kg)  01/07/17 242 lb (109.8 kg)  12/23/16 242 lb (109.8 kg)    Lab Results  Component Value Date   TSH 11.70 (A) 01/05/2017   TSH 2.01 10/06/2016   TSH 1.08 09/05/2016   FREET4 0.72 10/06/2016   FREET4 0.69 07/15/2016   FREET4 0.72 03/11/2016     Allergies as of 01/09/2017      Reactions   No Known Allergies       Medication List       Accurate as of 01/09/17  2:06 PM. Always  use your most recent med list.          acetaminophen 500 MG tablet Commonly known as:  TYLENOL Take 2 tablets (1,000 mg total) by mouth every 6 (six) hours as needed for headache.   buPROPion 150 MG 12 hr tablet Commonly known as:  WELLBUTRIN SR Take 1 tablet (150 mg total) by mouth every morning.   calcium carbonate 500 MG chewable tablet Commonly known as:  TUMS Chew 2 tablets (400 mg of elemental calcium total) by mouth 2 (two) times daily.   CoQ10 100 MG Caps Take 100 mg by mouth daily.   diclofenac 75 MG EC tablet Commonly known as:  VOLTAREN Take 75 mg by mouth 2 (two) times daily as needed. For cramps   HYDROcodone-acetaminophen 5-325 MG tablet Commonly known as:  NORCO/VICODIN Take 1-2 tablets by mouth every 4 (four) hours as needed for moderate pain.   ibuprofen 200 MG tablet Commonly known as:  ADVIL,MOTRIN Take 800 mg by mouth every 8 (eight) hours as needed (for headache).   levothyroxine 88 MCG tablet Commonly known as:  SYNTHROID Take 1 tablet (88 mcg total) by mouth daily before breakfast.   magnesium oxide 400 MG tablet Commonly known as:  MAG-OX Take 400 mg by mouth daily.   metFORMIN 500 MG tablet Commonly known as:  GLUCOPHAGE Take 1 tablet (500 mg total) by mouth 1 day or 1 dose.   metoprolol 50 MG tablet Commonly known as:  LOPRESSOR Take 1 tablet (50 mg total) by mouth 2 (two) times daily.   nortriptyline 10 MG capsule Commonly known as:  PAMELOR Take 1 capsule (10 mg total) by mouth at bedtime.   promethazine 12.5 MG tablet Commonly known as:  PHENERGAN Take 1 tablet (12.5 mg total) by mouth every 8 (eight) hours as needed for nausea or vomiting.   riboflavin 100 MG Tabs tablet Commonly known as:  VITAMIN B-2 Take 100 mg by mouth daily.   SUMAtriptan 6 MG/0.5ML Soln injection Commonly known as:  IMITREX Inject 0.5 mg into skin at earliest onset of headache. May repeat in 2 hours if headache persists or recurs.   SUMAtriptan 100  MG tablet Commonly known as:  IMITREX Take 100 mg by mouth daily as needed. For migraine headache.   Vitamin D (Ergocalciferol) 50000 units Caps capsule Commonly known as:  DRISDOL Take 1 capsule (50,000 Units total) by mouth every 7 (seven) days.       Allergies:  Allergies  Allergen Reactions  . No Known Allergies     Past Medical History:  Diagnosis Date  . 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 Fibroids   . Headache(784.0) 06/14/2007   Dr. Tomi Likens seeing pt. for HA management   . High blood pressure   . HYPERTENSION 06/14/2007  . Migraines   . Nodular goiter   . Obesity   . Vitamin D deficiency     Past Surgical History:  Procedure Laterality Date  . [place]   x2, with epidural & general anesth.   Marland Kitchen DILATION AND CURETTAGE OF UTERUS     x2  . THYROIDECTOMY  11/18/2016  . THYROIDECTOMY N/A 11/18/2016   Procedure: TOTAL THYROIDECTOMY;  Surgeon: Armandina Gemma, MD;  Location: [facility] OR;  Service: General;  Laterality: N/A;    Family History  Problem Relation Age of Onset  . Diabetes Mother   . Hypertension Mother   . Thyroid disease Mother     Social History:  reports that she has never smoked. She has never used smokeless tobacco. She reports that she does not drink alcohol or use drugs.      ROS  Hypertension: His not taking her medication today However is due to be followed by her PCP   Examination:   BP (!) 158/98   Pulse (!) 105   Ht 5\' 4"  (1.626 m)   Wt 248 lb (112.5 kg)   LMP 12/20/2016   SpO2 95%   BMI 42.57 kg/m    Thyroidectomy scar is present, no masses palpable in the neck Her right anterior lower neck is slightly tender Biceps reflexes slow slightly slow relaxation Skin appears normal  Assessment/Plan:  Multinodular goiter, Treated surgically because of local compressive symptoms She has done well with the surgery but appears to be significantly hypothyroid with symptoms and increased TSH For her weight of 248 pounds she  does need a significantly higher dose of levothyroxine than the 88 g she is taking  Since she has recent refill on her 88 g dose she can take an extra tablet twice a week until she finishes the dose Next prescription will be 112 g She will follow-up in 6 weeks with repeat thyroid functions  Patient Instructions  Take extra pill 2x per week    [facility] 01/09/2017  Note: This office note was prepared with  Dragon Music therapist. Any transcriptional errors that result from this process are unintentional.

## 2017-01-09 NOTE — Patient Instructions (Signed)
Take extra pill 2x per week

## 2017-01-10 DIAGNOSIS — E890 Postprocedural hypothyroidism: Secondary | ICD-10-CM | POA: Insufficient documentation

## 2017-01-10 MED ORDER — LEVOTHYROXINE SODIUM 112 MCG PO TABS: 112.0000 ug | ORAL_TABLET | Freq: Every day | ORAL | 2 refills | Status: DC

## 2017-01-20 ENCOUNTER — Ambulatory Visit (INDEPENDENT_AMBULATORY_CARE_PROVIDER_SITE_OTHER): Payer: 59 | Admitting: Family Medicine

## 2017-01-27 MED FILL — LEVOTHYROXINE 112 MCG TAB: 112 | 30 days supply | Qty: 30 | Fill #0

## 2017-02-17 ENCOUNTER — Other Ambulatory Visit (INDEPENDENT_AMBULATORY_CARE_PROVIDER_SITE_OTHER): Payer: 59

## 2017-02-17 DIAGNOSIS — E890 Postprocedural hypothyroidism: Secondary | ICD-10-CM

## 2017-02-17 LAB — T4, FREE: Free T4: 0.92 ng/dL (ref 0.60–1.60)

## 2017-02-17 LAB — TSH: TSH: 1.26 u[IU]/mL (ref 0.35–4.50)

## 2017-02-20 ENCOUNTER — Other Ambulatory Visit: Payer: Self-pay | Admitting: *Deleted

## 2017-02-20 ENCOUNTER — Encounter: Payer: Self-pay | Admitting: Family Medicine

## 2017-02-20 ENCOUNTER — Encounter: Payer: Self-pay | Admitting: Endocrinology

## 2017-02-20 ENCOUNTER — Ambulatory Visit (INDEPENDENT_AMBULATORY_CARE_PROVIDER_SITE_OTHER): Payer: 59 | Admitting: Endocrinology

## 2017-02-20 ENCOUNTER — Other Ambulatory Visit: Payer: Self-pay | Admitting: Internal Medicine

## 2017-02-20 ENCOUNTER — Ambulatory Visit (INDEPENDENT_AMBULATORY_CARE_PROVIDER_SITE_OTHER): Payer: 59 | Admitting: Family Medicine

## 2017-02-20 VITALS — BP 140/80 | HR 100 | Temp 98.7°F | Wt 251.8 lb

## 2017-02-20 VITALS — BP 139/102 | HR 91 | Ht 64.0 in | Wt 251.8 lb

## 2017-02-20 DIAGNOSIS — E890 Postprocedural hypothyroidism: Secondary | ICD-10-CM

## 2017-02-20 DIAGNOSIS — J209 Acute bronchitis, unspecified: Secondary | ICD-10-CM | POA: Diagnosis not present

## 2017-02-20 DIAGNOSIS — G43909 Migraine, unspecified, not intractable, without status migrainosus: Secondary | ICD-10-CM | POA: Diagnosis not present

## 2017-02-20 MED ORDER — SUMATRIPTAN SUCCINATE 100 MG PO TABS: ORAL_TABLET | ORAL | 3 refills | Status: DC

## 2017-02-20 MED ORDER — PREDNISONE 10 MG PO TABS: ORAL_TABLET | ORAL | 0 refills | Status: DC

## 2017-02-20 MED FILL — predniSONE 10 MG TABS: 10 | 7 days supply | Qty: 22 | Fill #0

## 2017-02-20 NOTE — Progress Notes (Signed)
Pre visit review using our clinic review tool, if applicable. No additional management support is needed unless otherwise documented below in the visit note. 

## 2017-02-20 NOTE — Progress Notes (Signed)
Subjective:     Patient ID: [name], female   DOB: Oct 25, 1965, 51 y.o.   MRN: [id number]  HPI Patient's seen for acute visit. She has long-standing history of migraine headaches and onset of migraine this past Wednesday. This is mostly left retro-orbital. Typical of previous migraines. She takes Imitrex injection and had improvement but not full resolution. No vomiting.  No other focal neurologic symptoms. She is not sure of specific triggers for her migraine headaches.  Onset one week ago of cough. Mostly nonproductive. No fevers or chills. She feels she has some wheezing at night. No history of asthma. Nonsmoker. No sore throat. No significant postnasal drip symptoms. No dyspnea with exertion. No pleuritic pain. No hemoptysis.  Past Medical History:  Diagnosis Date  . 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 Fibroids   . Headache(784.0) 06/14/2007   Dr. Tomi Likens seeing pt. for HA management   . High blood pressure   . HYPERTENSION 06/14/2007  . Migraines   . Nodular goiter   . Obesity   . Vitamin D deficiency    Past Surgical History:  Procedure Laterality Date  . [place]   x2, with epidural & general anesth.   Marland Kitchen DILATION AND CURETTAGE OF UTERUS     x2  . THYROIDECTOMY  11/18/2016  . THYROIDECTOMY N/A 11/18/2016   Procedure: TOTAL THYROIDECTOMY;  Surgeon: Armandina Gemma, MD;  Location: [place];  Service: General;  Laterality: N/A;    reports that she has never smoked. She has never used smokeless tobacco. She reports that she does not drink alcohol or use drugs. family history includes Diabetes in her mother; Hypertension in her mother; Thyroid disease in her mother. Allergies  Allergen Reactions  . No Known Allergies      Review of Systems  Constitutional: Negative for chills, fever and unexpected weight change.  HENT: Negative for congestion, ear pain and sore throat.   Respiratory: Positive for cough and wheezing. Negative for shortness of breath.    Cardiovascular: Negative for chest pain.  Genitourinary: Negative for dysuria.  Skin: Negative for rash.  Neurological: Positive for headaches. Negative for dizziness and weakness.  Psychiatric/Behavioral: Negative for confusion.       Objective:   Physical Exam  Constitutional: She is oriented to person, place, and time. She appears well-developed and well-nourished.  HENT:  Right Ear: External ear normal.  Left Ear: External ear normal.  Mouth/Throat: Oropharynx is clear and moist. No oropharyngeal exudate.  Neck: Neck supple. No thyromegaly present.  Cardiovascular: Normal rate and regular rhythm.   Pulmonary/Chest: Effort normal and breath sounds normal. No respiratory distress. She has no wheezes. She has no rales.  Lymphadenopathy:    She has no cervical adenopathy.  Neurological: She is alert and oriented to person, place, and time. No cranial nerve deficit.  Skin: No rash noted.       Assessment:     #1 cough. Suspect acute viral bronchitis. Nonfocal exam. Patient describes some wheezing but none noted on exam at this time. No respiratory distress with pulse oximetry 98%  #2 migraine headache. Not resolved with Imitrex    Plan:     -Prednisone taper over one week -Follow-up promptly for any fever or increased shortness of breath-or if headache not resolving in the next 24 hours  Eulas Post MD [facility]

## 2017-02-20 NOTE — Patient Instructions (Signed)

## 2017-02-20 NOTE — Patient Instructions (Signed)
No thyroid OTC

## 2017-02-20 NOTE — Telephone Encounter (Signed)
Opened in error

## 2017-02-20 NOTE — Progress Notes (Signed)
Patient ID: [name], female   DOB: 09-26-1966, 51 y.o.   MRN: [id number]           Reason for Appointment: Follow-up of thyroid    History of Present Illness:   She had a total thyroidectomy in 11/18/16 done for her multinodular goiter with symptoms of pressure and choking sensation in her neck and some difficulty with swallowing  She was seen in follow-up after her surgery and her TSH was 11.7, at that time she was taking 88 g of levothyroxine Also was feeling significantly fatigue Now taking 112 g of levothyroxine She takes this in the morning without food when she comes back from work and is doing this regularly Her fatigue is improved significantly TSH is back to normal at 1.3  Previous history: The patient's thyroid enlargement was first discovered in 03/2014 incidentally when the patient was at the urgent care center Her ultrasound showed a multinodular goiter  Since she had an increased size of the isthmus nodule this was evaluated with needle aspiration in 07/2016 and showed benign follicular adenoma .   Lab Results  Component Value Date   FREET4 0.92 02/17/2017   FREET4 0.72 10/06/2016   FREET4 0.69 07/15/2016   TSH 1.26 02/17/2017   TSH 11.70 (A) 01/05/2017   TSH 2.01 10/06/2016    She  had an ultrasound exam in 10/16 showed multiple nodules with the dominant solid nodule in the left isthmus measuring 2.8 cm, previously 2.6.    Other nodules were the same or minimally changed    Wt Readings from Last 3 Encounters:  02/20/17 251 lb 12.8 oz (114.2 kg)  01/09/17 248 lb (112.5 kg)  01/07/17 242 lb (109.8 kg)    Lab Results  Component Value Date   TSH 1.26 02/17/2017   TSH 11.70 (A) 01/05/2017   TSH 2.01 10/06/2016   FREET4 0.92 02/17/2017   FREET4 0.72 10/06/2016   FREET4 0.69 07/15/2016     Allergies as of 02/20/2017      Reactions   No Known Allergies       Medication List       Accurate as of 02/20/17  8:42 AM. Always use your most recent  med list.          acetaminophen 500 MG tablet Commonly known as:  TYLENOL Take 2 tablets (1,000 mg total) by mouth every 6 (six) hours as needed for headache.   buPROPion 150 MG 12 hr tablet Commonly known as:  WELLBUTRIN SR Take 1 tablet (150 mg total) by mouth every morning.   diclofenac 75 MG EC tablet Commonly known as:  VOLTAREN Take 75 mg by mouth 2 (two) times daily as needed. For cramps   ibuprofen 200 MG tablet Commonly known as:  ADVIL,MOTRIN Take 800 mg by mouth every 8 (eight) hours as needed (for headache).   levothyroxine 112 MCG tablet Commonly known as:  SYNTHROID Take 1 tablet (112 mcg total) by mouth daily before breakfast.   metoprolol 50 MG tablet Commonly known as:  LOPRESSOR Take 1 tablet (50 mg total) by mouth 2 (two) times daily.   nortriptyline 10 MG capsule Commonly known as:  PAMELOR Take 1 capsule (10 mg total) by mouth at bedtime.   promethazine 12.5 MG tablet Commonly known as:  PHENERGAN Take 1 tablet (12.5 mg total) by mouth every 8 (eight) hours as needed for nausea or vomiting.   SUMAtriptan 6 MG/0.5ML Soln injection Commonly known as:  IMITREX Inject 0.5 mg into  skin at earliest onset of headache. May repeat in 2 hours if headache persists or recurs.   SUMAtriptan 100 MG tablet Commonly known as:  IMITREX Take 100 mg by mouth daily as needed. For migraine headache.       Allergies:  Allergies  Allergen Reactions  . No Known Allergies     Past Medical History:  Diagnosis Date  . 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 Fibroids   . Headache(784.0) 06/14/2007   Dr. Tomi Likens seeing pt. for HA management   . High blood pressure   . HYPERTENSION 06/14/2007  . Migraines   . Nodular goiter   . Obesity   . Vitamin D deficiency     Past Surgical History:  Procedure Laterality Date  . [place]   x2, with epidural & general anesth.   Marland Kitchen DILATION AND CURETTAGE OF UTERUS     x2  . THYROIDECTOMY  11/18/2016  .  THYROIDECTOMY N/A 11/18/2016   Procedure: TOTAL THYROIDECTOMY;  Surgeon: Armandina Gemma, MD;  Location: [facility] OR;  Service: General;  Laterality: N/A;    Family History  Problem Relation Age of Onset  . Diabetes Mother   . Hypertension Mother   . Thyroid disease Mother     Social History:  reports that she has never smoked. She has never used smokeless tobacco. She reports that she does not drink alcohol or use drugs.      ROS  Hypertension:  Has not followed up with PCP as recommended She says blood pressure may be higher today because of having a migraine and not taking her medication  Has history of migraines followed by neurologist   Examination:   BP (!) 139/102   Pulse 91   Ht 5\' 4"  (1.626 m)   Wt 251 lb 12.8 oz (114.2 kg)   SpO2 97%   BMI 43.22 kg/m    Biceps reflexes slow normal relaxation Skin appears normal  Assessment/Plan:  Postsurgical hypothyroidism Now with increasing her dose of 112 g of levothyroxine she has had much less fatigue Also TSH is back to normal Advised her not to pick up any over-the-counter thyroid vitamins, explained that she does not have functioning thyroid and only needs levothyroxine  She will follow-up in 6 months with repeat thyroid functions  Advised her to see the primary care doctor for her hypertension  There are no Patient Instructions on file for this visit.   [facility] 02/20/2017  Note: This office note was prepared with Dragon voice recognition system technology. Any transcriptional errors that result from this process are unintentional.

## 2017-02-27 ENCOUNTER — Encounter: Payer: Self-pay | Admitting: Internal Medicine

## 2017-02-27 ENCOUNTER — Ambulatory Visit (INDEPENDENT_AMBULATORY_CARE_PROVIDER_SITE_OTHER): Payer: 59 | Admitting: Internal Medicine

## 2017-02-27 VITALS — BP 150/98 | HR 98 | Temp 99.0°F | Wt 250.6 lb

## 2017-02-27 DIAGNOSIS — I10 Essential (primary) hypertension: Secondary | ICD-10-CM | POA: Diagnosis not present

## 2017-02-27 DIAGNOSIS — E890 Postprocedural hypothyroidism: Secondary | ICD-10-CM | POA: Diagnosis not present

## 2017-02-27 MED ORDER — HYDROCHLOROTHIAZIDE 25 MG PO TABS: 25.0000 mg | ORAL_TABLET | Freq: Every day | ORAL | 3 refills | Status: DC

## 2017-02-27 MED FILL — SUMATRIPTAN SUCC 100 MG TAB: 100 | 30 days supply | Qty: 18 | Fill #0

## 2017-02-27 MED FILL — LEVOTHYROXINE 112 MCG TAB: 112 | 30 days supply | Qty: 30 | Fill #1

## 2017-02-27 MED FILL — HYDROCHLOROTHIAZIDE 25 MG T: 25 | 90 days supply | Qty: 90 | Fill #0

## 2017-02-27 NOTE — Progress Notes (Signed)
Subjective:    Patient ID: [name], female    DOB: 08-07-1966, 51 y.o.   MRN: [id number]  HPI  51 year old patient who has a history of essential hypertension.  She also has a history of postsurgical hypothyroidism.  The patient was seen 1 week ago with acute bronchitis and is completing a prednisone taper. She has been followed by neurology for migraine headaches.  More recently the patient has noted elevated blood pressure readings.  BP Readings from Last 3 Encounters:  02/27/17 (!) 150/98  02/20/17 140/80  02/20/17 (!) 139/102    Past Medical History:  Diagnosis Date  . 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 Fibroids   . Headache(784.0) 06/14/2007   Dr. Tomi Likens seeing pt. for HA management   . High blood pressure   . HYPERTENSION 06/14/2007  . Migraines   . Nodular goiter   . Obesity   . Vitamin D deficiency      Social History   Social History  . Marital status: Married    Spouse name: N/A  . Number of children: 3  . Years of education: N/A   Occupational History  . [employer]   Social History Main Topics  . Smoking status: Never Smoker  . Smokeless tobacco: Never Used  . Alcohol use No  . Drug use: No  . Sexual activity: Yes    Birth control/ protection: None   Other Topics Concern  . Not on file   Social History Narrative  . No narrative on file    Past Surgical History:  Procedure Laterality Date  . [place]   x2, with epidural & general anesth.   Marland Kitchen DILATION AND CURETTAGE OF UTERUS     x2  . THYROIDECTOMY  11/18/2016  . THYROIDECTOMY N/A 11/18/2016   Procedure: TOTAL THYROIDECTOMY;  Surgeon: Armandina Gemma, MD;  Location: [facility] OR;  Service: General;  Laterality: N/A;    Family History  Problem Relation Age of Onset  . Diabetes Mother   . Hypertension Mother   . Thyroid disease Mother     Allergies  Allergen Reactions  . No Known Allergies     Current Outpatient Prescriptions on File Prior to Visit    Medication Sig Dispense Refill  . acetaminophen (TYLENOL) 500 MG tablet Take 2 tablets (1,000 mg total) by mouth every 6 (six) hours as needed for headache. 30 tablet 0  . diclofenac (VOLTAREN) 75 MG EC tablet Take 75 mg by mouth 2 (two) times daily as needed. For cramps    . ibuprofen (ADVIL,MOTRIN) 200 MG tablet Take 800 mg by mouth every 8 (eight) hours as needed (for headache).    Marland Kitchen levothyroxine (SYNTHROID) 112 MCG tablet Take 1 tablet (112 mcg total) by mouth daily before breakfast. 30 tablet 2  . metoprolol (LOPRESSOR) 50 MG tablet Take 1 tablet (50 mg total) by mouth 2 (two) times daily. 60 tablet 0  . nortriptyline (PAMELOR) 10 MG capsule Take 1 capsule (10 mg total) by mouth at bedtime. 90 capsule 5  . promethazine (PHENERGAN) 12.5 MG tablet Take 1 tablet (12.5 mg total) by mouth every 8 (eight) hours as needed for nausea or vomiting. 30 tablet 3  . SUMAtriptan (IMITREX) 100 MG tablet TAKE 1 TABLET BY MOUTH EVERY 2 HOURS AS NEEDED FOR MIGRAINE 10 tablet 3  . SUMAtriptan (IMITREX) 6 MG/0.5ML SOLN injection Inject 0.5 mg into skin at earliest onset of headache. May repeat in 2 hours if headache persists or  recurs. 4.5 mL 3  . buPROPion (WELLBUTRIN SR) 150 MG 12 hr tablet Take 1 tablet (150 mg total) by mouth every morning. (Patient not taking: Reported on 02/27/2017) 30 tablet 0   No current facility-administered medications on file prior to visit.     BP (!) 150/98 (BP Location: Left Arm, Patient Position: Sitting, Cuff Size: Normal)   Pulse 98   Temp 99 F (37.2 C) (Oral)   Wt 250 lb 9.6 oz (113.7 kg)   SpO2 96%   BMI 43.02 kg/m    Review of Systems  Constitutional: Positive for fatigue.  HENT: Positive for sinus pressure. Negative for congestion, dental problem, hearing loss, rhinorrhea, sore throat and tinnitus.   Eyes: Negative for pain, discharge and visual disturbance.  Respiratory: Positive for cough. Negative for shortness of breath.   Cardiovascular: Negative for  chest pain, palpitations and leg swelling.  Gastrointestinal: Negative for abdominal distention, abdominal pain, blood in stool, constipation, diarrhea, nausea and vomiting.  Genitourinary: Negative for difficulty urinating, dysuria, flank pain, frequency, hematuria, pelvic pain, urgency, vaginal bleeding, vaginal discharge and vaginal pain.  Musculoskeletal: Negative for arthralgias, gait problem and joint swelling.  Skin: Negative for rash.  Neurological: Negative for dizziness, syncope, speech difficulty, weakness, numbness and headaches.  Hematological: Negative for adenopathy.  Psychiatric/Behavioral: Negative for agitation, behavioral problems and dysphoric mood. The patient is not nervous/anxious.        Objective:   Physical Exam  Constitutional: She is oriented to person, place, and time. She appears well-developed and well-nourished.  Blood pressure 152/90  HENT:  Head: Normocephalic.  Right Ear: External ear normal.  Left Ear: External ear normal.  Mouth/Throat: Oropharynx is clear and moist.  Eyes: Conjunctivae and EOM are normal. Pupils are equal, round, and reactive to light.  Neck: Normal range of motion. Neck supple. No thyromegaly present.  Cardiovascular: Normal rate, regular rhythm, normal heart sounds and intact distal pulses.   Pulmonary/Chest: Effort normal and breath sounds normal.  Abdominal: Soft. Bowel sounds are normal. She exhibits no mass. There is no tenderness.  Musculoskeletal: Normal range of motion.  Lymphadenopathy:    She has no cervical adenopathy.  Neurological: She is alert and oriented to person, place, and time.  Skin: Skin is warm and dry. No rash noted.  Psychiatric: She has a normal mood and affect. Her behavior is normal.          Assessment & Plan:   Essential hypertension.  Probably aggravated by acute illness and prednisone therapy.  We'll place on a DASH diet as well as diuretic therapy.  Patient has been on metoprolol in the  past as much for migraine prophylaxis as blood pressure control.  The patient now is on nortriptyline prophylaxis.  We'll consider tapering and discontinuation of metoprolol at next visit if blood pressure is well controlled on diuretic therapy Weight loss and exercise encouraged  Nyoka Cowden

## 2017-02-27 NOTE — Patient Instructions (Addendum)
WE NOW OFFER   [facility] FAST TRACK!!!  SAME DAY Appointments for ACUTE CARE  Such as: Sprains, Injuries, cuts, abrasions, rashes, muscle pain, joint pain, back pain Colds, flu, sore throats, headache, allergies, cough, fever  Ear pain, sinus and eye infections Abdominal pain, nausea, vomiting, diarrhea, upset stomach Animal/insect bites  3 Easy Ways to Schedule: Walk-In Scheduling Call in scheduling Mychart Sign-up: [email or web address]   Limit your sodium (Salt) intake  Please check your blood pressure on a regular basis.  If it is consistently greater than 150/90, please make an office appointment.    It is important that you exercise regularly, at least 20 minutes 3 to 4 times per week.  If you develop chest pain or shortness of breath seek  medical attention.  You need to lose weight.  Consider a lower calorie diet and regular exercise.        DASH Eating Plan DASH stands for "Dietary Approaches to Stop Hypertension." The DASH eating plan is a healthy eating plan that has been shown to reduce high blood pressure (hypertension). It may also reduce your risk for type 2 diabetes, heart disease, and stroke. The DASH eating plan may also help with weight loss. What are tips for following this plan? General guidelines   Avoid eating more than 2,300 mg (milligrams) of salt (sodium) a day. If you have hypertension, you may need to reduce your sodium intake to 1,500 mg a day.  Limit alcohol intake to no more than 1 drink a day for nonpregnant women and 2 drinks a day for men. One drink equals 12 oz of beer, 5 oz of wine, or 1 oz of hard liquor.  Work with your health care provider to maintain a healthy body weight or to lose weight. Ask what an ideal weight is for you.  Get at least 30 minutes of exercise that causes your heart to beat faster (aerobic exercise) most days of the week. Activities may include walking, swimming, or biking.  Work with your  health care provider or diet and nutrition specialist (dietitian) to adjust your eating plan to your individual calorie needs. Reading food labels   Check food labels for the amount of sodium per serving. Choose foods with less than 5 percent of the Daily Value of sodium. Generally, foods with less than 300 mg of sodium per serving fit into this eating plan.  To find whole grains, look for the word "whole" as the first word in the ingredient list. Shopping   Buy products labeled as "low-sodium" or "no salt added."  Buy fresh foods. Avoid canned foods and premade or frozen meals. Cooking   Avoid adding salt when cooking. Use salt-free seasonings or herbs instead of table salt or sea salt. Check with your health care provider or pharmacist before using salt substitutes.  Do not fry foods. Cook foods using healthy methods such as baking, boiling, grilling, and broiling instead.  Cook with heart-healthy oils, such as olive, canola, soybean, or sunflower oil. Meal planning    Eat a balanced diet that includes:  5 or more servings of fruits and vegetables each day. At each meal, try to fill half of your plate with fruits and vegetables.  Up to 6-8 servings of whole grains each day.  Less than 6 oz of lean meat, poultry, or fish each day. A 3-oz serving of meat is about the same size as a deck of cards. One egg equals 1 oz.  2 servings of low-fat dairy each  day.  A serving of nuts, seeds, or beans 5 times each week.  Heart-healthy fats. Healthy fats called Omega-3 fatty acids are found in foods such as flaxseeds and coldwater fish, like sardines, salmon, and mackerel.  Limit how much you eat of the following:  Canned or prepackaged foods.  Food that is high in trans fat, such as fried foods.  Food that is high in saturated fat, such as fatty meat.  Sweets, desserts, sugary drinks, and other foods with added sugar.  Full-fat dairy products.  Do not salt foods before  eating.  Try to eat at least 2 vegetarian meals each week.  Eat more home-cooked food and less restaurant, buffet, and fast food.  When eating at a restaurant, ask that your food be prepared with less salt or no salt, if possible. What foods are recommended? The items listed may not be a complete list. Talk with your dietitian about what dietary choices are best for you. Grains  Whole-grain or whole-wheat bread. Whole-grain or whole-wheat pasta. Brown rice. [place]. Bulgur. Whole-grain and low-sodium cereals. Pita bread. Low-fat, low-sodium crackers. Whole-wheat flour tortillas. Vegetables  Fresh or frozen vegetables (raw, steamed, roasted, or grilled). Low-sodium or reduced-sodium tomato and vegetable juice. Low-sodium or reduced-sodium tomato sauce and tomato paste. Low-sodium or reduced-sodium canned vegetables. Fruits  All fresh, dried, or frozen fruit. Canned fruit in natural juice (without added sugar). Meat and other protein foods  Skinless chicken or [place]. Ground chicken or [place]. Pork with fat trimmed off. Fish and seafood. Egg whites. Dried beans, peas, or lentils. Unsalted nuts, nut butters, and seeds. Unsalted canned beans. Lean cuts of beef with fat trimmed off. Low-sodium, lean deli meat. Dairy  Low-fat (1%) or fat-free (skim) milk. Fat-free, low-fat, or reduced-fat cheeses. Nonfat, low-sodium ricotta or cottage cheese. Low-fat or nonfat yogurt. Low-fat, low-sodium cheese. Fats and oils  Soft margarine without trans fats. Vegetable oil. Low-fat, reduced-fat, or light mayonnaise and salad dressings (reduced-sodium). Canola, safflower, olive, soybean, and sunflower oils. Avocado. Seasoning and other foods  Herbs. Spices. Seasoning mixes without salt. Unsalted popcorn and pretzels. Fat-free sweets. What foods are not recommended? The items listed may not be a complete list. Talk with your dietitian about what dietary choices are best for you. Grains  Baked goods made  with fat, such as croissants, muffins, or some breads. Dry pasta or rice meal packs. Vegetables  Creamed or fried vegetables. Vegetables in a cheese sauce. Regular canned vegetables (not low-sodium or reduced-sodium). Regular canned tomato sauce and paste (not low-sodium or reduced-sodium). Regular tomato and vegetable juice (not low-sodium or reduced-sodium). Angie Fava. Olives. Fruits  Canned fruit in a light or heavy syrup. Fried fruit. Fruit in cream or butter sauce. Meat and other protein foods  Fatty cuts of meat. Ribs. Fried meat. Berniece Salines. Sausage. Bologna and other processed lunch meats. Salami. Fatback. Hotdogs. Bratwurst. Salted nuts and seeds. Canned beans with added salt. Canned or smoked fish. Whole eggs or egg yolks. Chicken or [place] with skin. Dairy  Whole or 2% milk, cream, and half-and-half. Whole or full-fat cream cheese. Whole-fat or sweetened yogurt. Full-fat cheese. Nondairy creamers. Whipped toppings. Processed cheese and cheese spreads. Fats and oils  Butter. Stick margarine. Lard. Shortening. Ghee. Bacon fat. Tropical oils, such as coconut, palm kernel, or palm oil. Seasoning and other foods  Salted popcorn and pretzels. Onion salt, garlic salt, seasoned salt, table salt, and sea salt. Worcestershire sauce. Tartar sauce. Barbecue sauce. Teriyaki sauce. Soy sauce, including reduced-sodium. Steak sauce. Canned and  packaged gravies. Fish sauce. Oyster sauce. Cocktail sauce. Horseradish that you find on the shelf. Ketchup. Mustard. Meat flavorings and tenderizers. Bouillon cubes. Hot sauce and Tabasco sauce. Premade or packaged marinades. Premade or packaged taco seasonings. Relishes. Regular salad dressings. Where to find more information:  National Heart, Lung, and [place]: https://wilson-eaton.com/  American Heart Association: www.heart.org Summary  The DASH eating plan is a healthy eating plan that has been shown to reduce high blood pressure (hypertension). It may also reduce  your risk for type 2 diabetes, heart disease, and stroke.  With the DASH eating plan, you should limit salt (sodium) intake to 2,300 mg a day. If you have hypertension, you may need to reduce your sodium intake to 1,500 mg a day.  When on the DASH eating plan, aim to eat more fresh fruits and vegetables, whole grains, lean proteins, low-fat dairy, and heart-healthy fats.  Work with your health care provider or diet and nutrition specialist (dietitian) to adjust your eating plan to your individual calorie needs. This information is not intended to replace advice given to you by your health care provider. Make sure you discuss any questions you have with your health care provider. Document Released: 09/25/2011 Document Revised: 09/29/2016 Document Reviewed: 09/29/2016 Elsevier Interactive Patient Education  2017 [employer].

## 2017-03-25 ENCOUNTER — Other Ambulatory Visit (INDEPENDENT_AMBULATORY_CARE_PROVIDER_SITE_OTHER): Payer: Self-pay | Admitting: Family Medicine

## 2017-03-25 ENCOUNTER — Ambulatory Visit (INDEPENDENT_AMBULATORY_CARE_PROVIDER_SITE_OTHER): Payer: 59 | Admitting: Neurology

## 2017-03-25 ENCOUNTER — Encounter: Payer: Self-pay | Admitting: Neurology

## 2017-03-25 VITALS — BP 142/88 | HR 81 | Ht 64.0 in | Wt 253.0 lb

## 2017-03-25 DIAGNOSIS — G43009 Migraine without aura, not intractable, without status migrainosus: Secondary | ICD-10-CM

## 2017-03-25 DIAGNOSIS — I10 Essential (primary) hypertension: Secondary | ICD-10-CM

## 2017-03-25 MED ORDER — TOPIRAMATE ER 50 MG PO CAP24: 50.0000 mg | ORAL_CAPSULE | Freq: Every day | ORAL | 3 refills | Status: DC

## 2017-03-25 NOTE — Patient Instructions (Signed)
Migraine Recommendations: 1.  Stop nortriptyline and start Trokendi XR (extended release topiramate) 50mg  at bedtime.  Call in 4 weeks with update and we can adjust dose if needed. 2.  Take sumatriptan at earliest onset of headache.  May repeat dose once in 2 hours if needed.  Do not exceed two tablets in 24 hours. 3.  Limit use of pain relievers to no more than 2 days out of the week.  These medications include acetaminophen, ibuprofen, triptans and narcotics.  This will help reduce risk of rebound headaches. 4.  Be aware of common food triggers such as processed sweets, processed foods with nitrites (such as deli meat, hot dogs, sausages), foods with MSG, alcohol (such as wine), chocolate, certain cheeses, certain fruits (dried fruits, some citrus fruit), vinegar, diet soda. 4.  Avoid caffeine 5.  Routine exercise 6.  Proper sleep hygiene 7.  Stay adequately hydrated with water 8.  Keep a headache diary. 9.  Maintain proper stress management. 10.  Do not skip meals. 11.  Consider supplements:  Magnesium citrate 400mg  to 600mg  daily, riboflavin 400mg , Coenzyme Q 10 100mg  three times daily 12.  Follow up in 4 months.

## 2017-03-25 NOTE — Progress Notes (Signed)
NEUROLOGY FOLLOW UP OFFICE NOTE  [name] [id number]  HISTORY OF PRESENT ILLNESS: [name] is a 51 year old left-handed female with hypertension and post-surgical hypothyrodism who follows up for migraines.     UPDATE: Headaches were doing well, but then frequency increased in February after thyroidectomy of multinodular goiter. Intensity:  Mild 4/10, severe 8/10 Duration:  4 to 6 hours  Frequency:  6 days per month. Current NSAIDS:  Ibuprofen (effective for mild), diclofenac 75mg  (for fibroid pain) Current analgesics:  Tylenol (partially effective) Current triptans:  sumatriptan 6mg  [name] (effective but makes her drowsy), sumatriptan 100mg  tablet. Current anti-emetic:  promethazine 12.5mg  Current muscle relaxants:  Flexeril Current anti-anxiolytic:  no Current sleep aide:  no Antihypertensive medications:  metoprolol 50mg  Antidepressant medications:  nortriptyline 10mg  Anticonvulsant medications:  no Vitamins/Herbal/Supplements:  Magnesium, riboflavin, coenzyme Q10   Caffeine:  Coffee usually only for headache treatment Alcohol:  no Smoker:  no Diet:  Signed up for weight loss clinic but stopped.  Drinks water Exercise:  no Depression/stress:  Better.   Sleep hygiene:  Variable.  Usually sleeps 4 to 6 hours.  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  She is starting a new job in October with daytime hours.   HISTORY: Onset:  She has had migraines since age 51, however they have steadily gotten worse over the past year and have been daily since last week. Location:  Varies (either side, holocephalic).  Over the past week, she has had left sided neck pain Quality:  pounding Initial Intensity:  10/10; December: 4/10 mild, 10/10 severe Aura:  no Prodrome:  no Associated symptoms:  Nausea, photophobia, sometimes vomiting Initial Duration:  Several hours to 2 days; December: Within one hour Initial Frequency:  Usually 2-3 times a month; December: 4 headaches a  month (1 headache severe) Triggers/exacerbating factors:  cycle Relieving factors:  Sleep, medication Activity:  Cannot function   Past abortive medication:  Tylenol, sumatriptan NS, sumatriptan 100mg  tablet, Relpax, ibuprofen 800mg , caffeine or hydrocodone, rizatriptan Past preventative medication:  Topiramate 50mg  twice daily (paresthesias), Trokendi XR Other past therapy:  none   Family history of headache:  No  PAST MEDICAL HISTORY: Past Medical History:  Diagnosis Date  . 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 Fibroids   . Headache(784.0) 06/14/2007   Dr. Tomi Likens seeing pt. for HA management   . High blood pressure   . HYPERTENSION 06/14/2007  . Migraines   . Nodular goiter   . Obesity   . Vitamin D deficiency     MEDICATIONS: Current Outpatient Prescriptions on File Prior to Visit  Medication Sig Dispense Refill  . acetaminophen (TYLENOL) 500 MG tablet Take 2 tablets (1,000 mg total) by mouth every 6 (six) hours as needed for headache. 30 tablet 0  . diclofenac (VOLTAREN) 75 MG EC tablet Take 75 mg by mouth 2 (two) times daily as needed. For cramps    . hydrochlorothiazide (HYDRODIURIL) 25 MG tablet Take 1 tablet (25 mg total) by mouth daily. 90 tablet 3  . ibuprofen (ADVIL,MOTRIN) 200 MG tablet Take 800 mg by mouth every 8 (eight) hours as needed (for headache).    Marland Kitchen levothyroxine (SYNTHROID) 112 MCG tablet Take 1 tablet (112 mcg total) by mouth daily before breakfast. 30 tablet 2  . metoprolol (LOPRESSOR) 50 MG tablet Take 1 tablet (50 mg total) by mouth 2 (two) times daily. 60 tablet 0  . promethazine (PHENERGAN) 12.5 MG tablet Take 1 tablet (12.5 mg total) by mouth every 8 (eight)  hours as needed for nausea or vomiting. 30 tablet 3  . SUMAtriptan (IMITREX) 100 MG tablet TAKE 1 TABLET BY MOUTH EVERY 2 HOURS AS NEEDED FOR MIGRAINE 10 tablet 3  . SUMAtriptan (IMITREX) 6 MG/0.5ML SOLN injection Inject 0.5 mg into skin at earliest onset of headache. May repeat in 2 hours if headache  persists or recurs. 4.5 mL 3   No current facility-administered medications on file prior to visit.     ALLERGIES: Allergies  Allergen Reactions  . No Known Allergies     FAMILY HISTORY: Family History  Problem Relation Age of Onset  . Diabetes Mother   . Hypertension Mother   . Thyroid disease Mother     SOCIAL HISTORY: Social History   Social History  . Marital status: Married    Spouse name: N/A  . Number of children: 3  . Years of education: N/A   Occupational History  . [employer]   Social History Main Topics  . Smoking status: Never Smoker  . Smokeless tobacco: Never Used  . Alcohol use No  . Drug use: No  . Sexual activity: Yes    Birth control/ protection: None   Other Topics Concern  . Not on file   Social History Narrative  . No narrative on file    REVIEW OF SYSTEMS: Constitutional: No fevers, chills, or sweats, no generalized fatigue, change in appetite Eyes: No visual changes, double vision, eye pain Ear, nose and throat: No hearing loss, ear pain, nasal congestion, sore throat Cardiovascular: No chest pain, palpitations Respiratory:  No shortness of breath at rest or with exertion, wheezes GastrointestinaI: No nausea, vomiting, diarrhea, abdominal pain, fecal incontinence Genitourinary:  No dysuria, urinary retention or frequency Musculoskeletal:  No neck pain, back pain Integumentary: No rash, pruritus, skin lesions Neurological: as above Psychiatric: No depression, insomnia, anxiety Endocrine: No palpitations, fatigue, diaphoresis, mood swings, change in appetite, change in weight, increased thirst Hematologic/Lymphatic:  No purpura, petechiae. Allergic/Immunologic: no itchy/runny eyes, nasal congestion, recent allergic reactions, rashes  PHYSICAL EXAM: Vitals:   03/25/17 0734  BP: (!) 142/88  Pulse: 81   General: No acute distress.  Patient appears well-groomed.  Morbidly obese body habitus. Head:   Normocephalic/atraumatic Eyes:  Fundi examined but not visualized Neck: supple, no paraspinal tenderness, full range of motion Heart:  Regular rate and rhythm Lungs:  Clear to auscultation bilaterally Back: No paraspinal tenderness Neurological Exam: alert and oriented to person, place, and time. Attention span and concentration intact, recent and remote memory intact, fund of knowledge intact.  Speech fluent and not dysarthric, language intact.  CN II-XII intact. Bulk and tone normal, muscle strength 5/5 throughout.  Sensation to light touch, temperature and vibration intact.  Deep tendon reflexes 2+ throughout, toes downgoing.  Finger to nose and heel to shin testing intact.  Gait normal, Romberg negative.  IMPRESSION: Migraine without aura Hypertension, borderline elevated today Morbid obesity  PLAN: 1.  She is concerned about weight gain.  We will switch from nortriptyline to topiramate 50mg  at bedtime.  We will start extended-release topiramate because she had experienced paresthesias with immediate-release topiramate in the past (but it was effective) 2.  Sumatriptan 100mg  tablet or 6mg  [place] if needed (or ibuprofen and Tylenol if mild) 3.  Lifestyle modification:  Sleep hygiene, exercise, diet and weight loss 4.  Follow up blood pressure with PCP 5.  Follow up in 4 months.  Metta Clines, DO  CC:  Bluford Kaufmann, MD

## 2017-03-31 ENCOUNTER — Ambulatory Visit: Payer: 59 | Admitting: Internal Medicine

## 2017-04-07 ENCOUNTER — Telehealth: Payer: Self-pay

## 2017-04-07 NOTE — Telephone Encounter (Signed)
Faxed prior authorization  For Trokendi XR 50mg  cap to Cover My Meds.

## 2017-04-10 ENCOUNTER — Telehealth: Payer: Self-pay

## 2017-04-10 NOTE — Patient Outreach (Signed)
Called [facility] member to schedule a follow up appointment, there was no answer, left message to call me back. 

## 2017-04-10 NOTE — Telephone Encounter (Signed)
Per covermymeds Trokendi has been approved.

## 2017-04-13 MED FILL — SUMATRIPTAN SUCC 100 MG TAB: 100 | 30 days supply | Qty: 18 | Fill #1

## 2017-04-13 MED FILL — LEVOTHYROXINE 112 MCG TAB: 112 | 30 days supply | Qty: 30 | Fill #2

## 2017-04-13 MED FILL — TROKENDI XR 50 MG CAPSULE: 50 | 30 days supply | Qty: 30 | Fill #0

## 2017-05-11 ENCOUNTER — Telehealth: Payer: Self-pay | Admitting: Neurology

## 2017-05-11 NOTE — Telephone Encounter (Signed)
Caller: [name]   Urgent? No  Reason for the call: Patient called to say that her FMLA paper work will be faxed over to the office and could the start day on the paper work be 02/17/2017. She said that is when she had to start leaving. Please Advise. Thanks

## 2017-05-11 NOTE — Telephone Encounter (Signed)
Will address with Dr. Tomi Likens once we receive paperwork.

## 2017-05-25 ENCOUNTER — Other Ambulatory Visit: Payer: Self-pay | Admitting: Endocrinology

## 2017-05-25 DIAGNOSIS — Z029 Encounter for administrative examinations, unspecified: Secondary | ICD-10-CM

## 2017-05-25 MED FILL — LEVOTHYROXINE 112 MCG TAB: 112 | 30 days supply | Qty: 30 | Fill #0

## 2017-06-29 ENCOUNTER — Ambulatory Visit: Payer: 59

## 2017-06-29 ENCOUNTER — Telehealth: Payer: Self-pay | Admitting: Neurology

## 2017-06-29 NOTE — Telephone Encounter (Signed)
Pt has used 2 imitrex tabs with tylenol and ibuprofen, which have lessened  Headache but hasn't gotten rid of it. Offered Pt injection to break headache and prevent medication rebound. Pt has driver in the morning.

## 2017-06-29 NOTE — Telephone Encounter (Signed)
Patient would like to talk someone about a migraine she has had since Friday

## 2017-06-30 ENCOUNTER — Ambulatory Visit (INDEPENDENT_AMBULATORY_CARE_PROVIDER_SITE_OTHER): Payer: 59

## 2017-06-30 DIAGNOSIS — G43009 Migraine without aura, not intractable, without status migrainosus: Secondary | ICD-10-CM | POA: Diagnosis not present

## 2017-06-30 MED ORDER — METOCLOPRAMIDE HCL 5 MG/ML IJ SOLN
10.0000 mg | Freq: Once | INTRAVENOUS | Status: AC
  Administered 2017-06-30: 10 mg via INTRAMUSCULAR

## 2017-06-30 MED ORDER — DIPHENHYDRAMINE HCL 50 MG/ML IJ SOLN
50.0000 mg | Freq: Once | INTRAMUSCULAR | Status: AC
  Administered 2017-06-30: 50 mg via INTRAMUSCULAR

## 2017-06-30 MED ORDER — KETOROLAC TROMETHAMINE 60 MG/2ML IM SOLN
60.0000 mg | Freq: Once | INTRAMUSCULAR | Status: AC
  Administered 2017-06-30: 60 mg via INTRAMUSCULAR

## 2017-07-09 ENCOUNTER — Encounter: Payer: Self-pay | Admitting: Internal Medicine

## 2017-07-13 MED FILL — LEVOTHYROXINE 112 MCG TAB: 112 | 30 days supply | Qty: 30 | Fill #1

## 2017-07-13 MED FILL — HYDROCHLOROTHIAZIDE 25 MG T: 25 | 90 days supply | Qty: 90 | Fill #1

## 2017-07-21 MED FILL — SUMATRIPTAN SUCC 100 MG TAB: 100 | 30 days supply | Qty: 10 | Fill #0

## 2017-07-24 ENCOUNTER — Ambulatory Visit (INDEPENDENT_AMBULATORY_CARE_PROVIDER_SITE_OTHER): Payer: 59 | Admitting: Physician Assistant

## 2017-07-24 ENCOUNTER — Encounter: Payer: Self-pay | Admitting: Physician Assistant

## 2017-07-24 VITALS — BP 142/92 | HR 107 | Temp 98.8°F | Resp 17 | Ht 64.0 in | Wt 260.0 lb

## 2017-07-24 DIAGNOSIS — R000 Tachycardia, unspecified: Secondary | ICD-10-CM | POA: Diagnosis not present

## 2017-07-24 DIAGNOSIS — I10 Essential (primary) hypertension: Secondary | ICD-10-CM | POA: Diagnosis not present

## 2017-07-24 MED ORDER — ATENOLOL-CHLORTHALIDONE 50-25 MG PO TABS: 1.0000 | ORAL_TABLET | Freq: Every day | ORAL | 3 refills | Status: DC

## 2017-07-24 NOTE — Patient Instructions (Addendum)
  Stop taking your HCTZ.  Do not take metoprolol with the medication that I have prescribed. I want to see you back in near the end of this month or in early December.    IF you received an x-ray today, you will receive an invoice from [facility]. Please contact [facility] at [phone] with questions or concerns regarding your invoice.   IF you received labwork today, you will receive an invoice from [facility]. Please contact [facility] at [phone] with questions or concerns regarding your invoice.   Our billing staff will not be able to assist you with questions regarding bills from these companies.  You will be contacted with the lab results as soon as they are available. The fastest way to get your results is to activate your My Chart account. Instructions are located on the last page of this paperwork. If you have not heard from [name] regarding the results in 2 weeks, please contact this office.

## 2017-07-24 NOTE — Progress Notes (Signed)
07/30/2017 10:44 AM   DOB: [birth year] / MRN: [id number]  SUBJECTIVE:  [name] is a 51 y.o. female presenting for medication titration. Tells me that she has had HTN since age 30 and was not overweight at the time of that diagnosis. Tells me that her leg swelling has gotten worse over the last few months despite taking HCTZ.  She wants an alternative today. Takes metoprolol for a history of tachycardia and is out of that medication today.   She is allergic to no known allergies.   She  has a past medical history of EXOGENOUS OBESITY (03/14/2010); Fibroids; QMGNOIBB(048.8) (06/14/2007); High blood pressure; HYPERTENSION (06/14/2007); Migraines; Nodular goiter; Obesity; and Vitamin D deficiency.    She  reports that she has never smoked. She has never used smokeless tobacco. She reports that she does not drink alcohol or use drugs. She  reports that she currently engages in sexual activity. She reports using the following method of birth control/protection: None. The patient  has a past surgical history that includes Dilation and curettage of uterus; Cesarean section ([place]); Thyroidectomy (11/18/2016); and Thyroidectomy (N/A, 11/18/2016).  Her family history includes Diabetes in her mother; Hypertension in her mother; Thyroid disease in her mother.  Review of Systems  Constitutional: Negative for chills, diaphoresis and fever.  Gastrointestinal: Negative for nausea.  Skin: Negative for rash.  Neurological: Negative for dizziness.    The problem list and medications were reviewed and updated by myself where necessary and exist elsewhere in the encounter.   OBJECTIVE:  BP (!) 142/92   Pulse (!) 107   Temp 98.8 F (37.1 C) (Oral)   Resp 17   Ht 5\' 4"  (1.626 m)   Wt 260 lb (117.9 kg)   LMP 07/23/2017 (Approximate)   SpO2 98%   BMI 44.63 kg/m   Lab Results  Component Value Date   CREATININE 0.81 07/24/2017   BUN 9 07/24/2017   NA 142 07/24/2017   K 4.0 07/24/2017   CL 103 07/24/2017   CO2 27 07/24/2017   Lab Results  Component Value Date   WBC 9.2 07/24/2017   HGB 12.1 07/24/2017   HCT 37.2 07/24/2017   MCV 76 (L) 07/24/2017   PLT 359 07/24/2017   Lab Results  Component Value Date   TSH 1.26 02/17/2017   Lab Results  Component Value Date   HGBA1C 6.0 (H) 10/06/2016       Pulse Readings from Last 3 Encounters:  07/27/17 90  07/24/17 (!) 107  03/25/17 81   BP Readings from Last 3 Encounters:  07/27/17 (!) 158/94  07/24/17 (!) 142/92  03/25/17 (!) 142/88   Wt Readings from Last 3 Encounters:  07/27/17 260 lb (117.9 kg)  07/24/17 260 lb (117.9 kg)  03/25/17 253 lb (114.8 kg)      Physical Exam  Constitutional: She is oriented to person, place, and time. She is active.  Non-toxic appearance.  Cardiovascular: Normal rate, regular rhythm and normal heart sounds.   Pulmonary/Chest: Effort normal and breath sounds normal. No tachypnea.  Musculoskeletal: She exhibits edema. She exhibits no tenderness.  Neurological: She is alert and oriented to person, place, and time.  Skin: Skin is warm and dry. She is not diaphoretic. No pallor.   s Lab Results  Component Value Date   TSH 1.26 02/17/2017   Lab Results  Component Value Date   CREATININE 0.81 07/24/2017   BUN 9 07/24/2017   NA 142 07/24/2017   K 4.0  07/24/2017   CL 103 07/24/2017   CO2 27 07/24/2017   Lab Results  Component Value Date   ALT 17 07/24/2017   AST 20 07/24/2017   ALKPHOS 95 07/24/2017   BILITOT <0.2 07/24/2017   . Lab Results  Component Value Date   WBC 9.2 07/24/2017   HGB 12.1 07/24/2017   HCT 37.2 07/24/2017   MCV 76 (L) 07/24/2017   PLT 359 07/24/2017   BNP    Component Value Date/Time   BNP 4.9 07/24/2017 1803    No results found for this or any previous visit (from the past 64 hour(s)).  No results found.  ASSESSMENT AND PLAN:  Angelisse was seen today for leg swelling and medication refill.  Diagnoses and all orders for this  visit:  Tachycardia -     CBC -     CMP and Liver  -     atenolol-chlorthalidone (TENORETIC) 50-25 MG tablet; Take 1 tablet by mouth daily.  Essential hypertension: I do wonder if she may have some renal artery stenosis given being diagnosed with HTN at such a young age.  -     Brain natriuretic peptide -     US Renal Artery Stenosis; Future       The patient is advised to call or return to clinic if she does not see an improvement in symptoms, or to seek the care of the closest emergency department if she worsens with the above plan.   Philis Fendt, MHS, PA-C [facility] 07/30/2017 10:44 AM

## 2017-07-25 LAB — CMP AND LIVER
ALBUMIN: 4.2 g/dL (ref 3.5–5.5)
ALT: 17 IU/L (ref 0–32)
AST: 20 IU/L (ref 0–40)
Alkaline Phosphatase: 95 IU/L (ref 39–117)
BUN: 9 mg/dL (ref 6–24)
Bilirubin, Direct: 0.06 mg/dL (ref 0.00–0.40)
CO2: 27 mmol/L (ref 20–29)
CREATININE: 0.81 mg/dL (ref 0.57–1.00)
Calcium: 8.9 mg/dL (ref 8.7–10.2)
Chloride: 103 mmol/L (ref 96–106)
GFR, EST AFRICAN AMERICAN: 98 mL/min/{1.73_m2} (ref >59)
GFR, EST NON AFRICAN AMERICAN: 85 mL/min/{1.73_m2} (ref >59)
GLUCOSE: 101 mg/dL — AB (ref 65–99)
Potassium: 4 mmol/L (ref 3.5–5.2)
SODIUM: 142 mmol/L (ref 134–144)
TOTAL PROTEIN: 7.2 g/dL (ref 6.0–8.5)

## 2017-07-25 LAB — CBC
HEMOGLOBIN: 12.1 g/dL (ref 11.1–15.9)
Hematocrit: 37.2 % (ref 34.0–46.6)
MCH: 24.6 pg — AB (ref 26.6–33.0)
MCHC: 32.5 g/dL (ref 31.5–35.7)
MCV: 76 fL — ABNORMAL LOW (ref 79–97)
Platelets: 359 10*3/uL (ref 150–379)
RBC: 4.91 x10E6/uL (ref 3.77–5.28)
RDW: 15.3 % (ref 12.3–15.4)
WBC: 9.2 10*3/uL (ref 3.4–10.8)

## 2017-07-27 ENCOUNTER — Encounter: Payer: Self-pay | Admitting: Neurology

## 2017-07-27 ENCOUNTER — Ambulatory Visit (INDEPENDENT_AMBULATORY_CARE_PROVIDER_SITE_OTHER): Payer: 59 | Admitting: Neurology

## 2017-07-27 VITALS — BP 158/94 | HR 90 | Ht 65.0 in | Wt 260.0 lb

## 2017-07-27 DIAGNOSIS — G43009 Migraine without aura, not intractable, without status migrainosus: Secondary | ICD-10-CM

## 2017-07-27 DIAGNOSIS — I10 Essential (primary) hypertension: Secondary | ICD-10-CM | POA: Diagnosis not present

## 2017-07-27 MED ORDER — ZOLMITRIPTAN 2.5 MG NA SOLN: NASAL | 2 refills | Status: DC

## 2017-07-27 MED ORDER — ZOLMITRIPTAN 2.5 MG NA SOLN: 2.5000 mg | NASAL | 0 refills | Status: DC

## 2017-07-27 MED FILL — ZOMIG 2.5 MG NASAL SPRAY: 2.5 | 30 days supply | Qty: 6 | Fill #0

## 2017-07-27 NOTE — Patient Instructions (Signed)
1.  Continue Trokendi XR 50mg  at bedtime for now.  If migraines don't improve in 2 weeks, contact me and we can increase dose. 2.  At earliest onset of migraine, take Zomig nasal spray, 1 spray in one nostril.  You may repeat dose in 2 hours if needed.

## 2017-07-27 NOTE — Progress Notes (Signed)
NEUROLOGY FOLLOW UP OFFICE NOTE  [name] [id number]  HISTORY OF PRESENT ILLNESS: [name] is a 51 year old left-handed female with hypertension and post-surgical hypothyrodism who follows up for migraines.     UPDATE: Over the past 2 weeks, she has had increase in migraines, which may be triggered by stress of moving work shift to the daytime, which has off-set her sleep cycle.  Zembrace SymTouch is effective but it makes her too drowsy to work and it costs $75. Intensity:  Mild 4/10, severe 8/10 Duration:  4 to 6 hours Frequency:  Usuallly 6 days per month.  7 days over the past 2 weeks. Current NSAIDS:  naproxen(effective for mild), diclofenac 75mg  (for fibroid pain) Current analgesics:  Tylenol (partially effective) Current triptans:  Zembrace SymTouch (effective but makes her drowsy), sumatriptan 100mg  tablet (ineffective) Current anti-emetic:  promethazine 12.5mg  Current muscle relaxants:  Flexeril Current anti-anxiolytic:  no Current sleep aide:  no Antihypertensive medications:  metoprolol 50mg  Antidepressant medications:  no Anticonvulsant medications:  topiramate ER 50mg  Vitamins/Herbal/Supplements:  Magnesium, riboflavin, coenzyme Q10   Caffeine:  Coffee usually only for headache treatment Alcohol:  no Smoker:  no Diet:  Signed up for weight loss clinic but stopped.  Drinks water Exercise:  no Depression/stress:  Increased since change to day shift.   Sleep hygiene:  Worse since change to day shift.   HISTORY: Onset:  She has had migraines since age 2, however they have steadily gotten worse over the past year and have been daily since last week. Location:  Varies (either side, holocephalic).  Over the past week, she has had left sided neck pain Quality:  pounding Initial Intensity:  10/10; December: 4/10 mild, 10/10 severe Aura:  no Prodrome:  no Associated symptoms:  Nausea, photophobia, sometimes vomiting Initial Duration:  Several hours to 2  days; December: Within one hour Initial Frequency:  Usually 2-3 times a month; December: 4 headaches a month (1 headache severe) Triggers/exacerbating factors:  cycle Relieving factors:  Sleep, medication Activity:  Cannot function   Past abortive medication:  Tylenol, sumatriptan NS, sumatriptan 100mg  tablet, Relpax, ibuprofen 800mg , caffeine or hydrocodone, rizatriptan Past preventative medication:  nortriptyline 10mg  (concerned about weight gain) Other past therapy:  none   Family history of headache:  No  PAST MEDICAL HISTORY: Past Medical History:  Diagnosis Date  . 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 Fibroids   . Headache(784.0) 06/14/2007   Dr. Tomi Calhoun seeing pt. for HA management   . High blood pressure   . HYPERTENSION 06/14/2007  . Migraines   . Nodular goiter   . Obesity   . Vitamin D deficiency     MEDICATIONS: Current Outpatient Prescriptions on File Prior to Visit  Medication Sig Dispense Refill  . acetaminophen (TYLENOL) 500 MG tablet Take 2 tablets (1,000 mg total) by mouth every 6 (six) hours as needed for headache. 30 tablet 0  . atenolol-chlorthalidone (TENORETIC) 50-25 MG tablet Take 1 tablet by mouth daily. 30 tablet 3  . diclofenac (VOLTAREN) 75 MG EC tablet Take 75 mg by mouth 2 (two) times daily as needed. For cramps    . ibuprofen (ADVIL,MOTRIN) 200 MG tablet Take 800 mg by mouth every 8 (eight) hours as needed (for headache).    Marland Kitchen levothyroxine (SYNTHROID, LEVOTHROID) 112 MCG tablet TAKE 1 TABLET (112 MCG TOTAL) BY MOUTH DAILY BEFORE BREAKFAST. 30 tablet 2  . promethazine (PHENERGAN) 12.5 MG tablet Take 1 tablet (12.5 mg total) by mouth every 8 (eight) hours as  needed for nausea or vomiting. 30 tablet 3  . SUMAtriptan (IMITREX) 100 MG tablet TAKE 1 TABLET BY MOUTH EVERY 2 HOURS AS NEEDED FOR MIGRAINE 10 tablet 3  . Topiramate ER (TROKENDI XR) 50 MG CP24 Take 50 mg by mouth at bedtime. 30 capsule 3   No current facility-administered medications on file prior to  visit.     ALLERGIES: Allergies  Allergen Reactions  . No Known Allergies     FAMILY HISTORY: Family History  Problem Relation Age of Onset  . Diabetes Mother   . Hypertension Mother   . Thyroid disease Mother     SOCIAL HISTORY: Social History   Social History  . Marital status: Married    Spouse name: N/A  . Number of children: 3  . Years of education: N/A   Occupational History  . [employer]   Social History Main Topics  . Smoking status: Never Smoker  . Smokeless tobacco: Never Used  . Alcohol use No  . Drug use: No  . Sexual activity: Yes    Birth control/ protection: None   Other Topics Concern  . Not on file   Social History Narrative  . No narrative on file    REVIEW OF SYSTEMS: Constitutional: No fevers, chills, or sweats, no generalized fatigue, change in appetite Eyes: No visual changes, double vision, eye pain Ear, nose and throat: No hearing loss, ear pain, nasal congestion, sore throat Cardiovascular: No chest pain, palpitations Respiratory:  No shortness of breath at rest or with exertion, wheezes GastrointestinaI: No nausea, vomiting, diarrhea, abdominal pain, fecal incontinence Genitourinary:  No dysuria, urinary retention or frequency Musculoskeletal:  No neck pain, back pain Integumentary: No rash, pruritus, skin lesions Neurological: as above Psychiatric: No depression, insomnia, anxiety Endocrine: No palpitations, fatigue, diaphoresis, mood swings, change in appetite, change in weight, increased thirst Hematologic/Lymphatic:  No purpura, petechiae. Allergic/Immunologic: no itchy/runny eyes, nasal congestion, recent allergic reactions, rashes  PHYSICAL EXAM: Vitals:   07/27/17 0746  BP: (!) 158/94  Pulse: 90  SpO2: 97%   General: No acute distress.  Patient appears well-groomed.  Morbidly obese body habitus. Head:  Normocephalic/atraumatic Eyes:  Fundi examined but not visualized Neck: supple, no paraspinal  tenderness, full range of motion Heart:  Regular rate and rhythm Lungs:  Clear to auscultation bilaterally Back: No paraspinal tenderness Neurological Exam: alert and oriented to person, place, and time. Attention span and concentration intact, recent and remote memory intact, fund of knowledge intact.  Speech fluent and not dysarthric, language intact.  CN II-XII intact. Bulk and tone normal, muscle strength 5/5 throughout.  Sensation to light touch  intact.  Deep tendon reflexes 2+ throughout.  Finger to nose testing intact.  Gait normal, Romberg negative.  IMPRESSION: Migraine HTN Morbid obesity  PLAN: 1.  She does not want to switch from [facility] to the regular sumatriptan injection due to fear of needle.  Instead, we will try Zomig 2.5mg  NS as abortive therapy. 2.  Increase headache frequency likely response to change in working hours and adjusting to new sleep cycle.  Continue topiramate ER 50mg  at bedtime for now.  If migraines not improved after 2 weeks, she is to contact us and we can increase dose to 100mg  at bedtime. 3.  Follow up blood pressure with PCP. 4.  Weight loss, exercise 5.  Follow up in 3 months.  Metta Clines, DO  CC:  Dr. Burnice Logan

## 2017-07-28 LAB — BRAIN NATRIURETIC PEPTIDE: BNP: 4.9 pg/mL (ref 0.0–100.0)

## 2017-08-19 ENCOUNTER — Ambulatory Visit (INDEPENDENT_AMBULATORY_CARE_PROVIDER_SITE_OTHER): Payer: 59 | Admitting: Family Medicine

## 2017-08-19 ENCOUNTER — Encounter: Payer: Self-pay | Admitting: Family Medicine

## 2017-08-19 VITALS — BP 157/100 | HR 97 | Ht 64.0 in | Wt 257.0 lb

## 2017-08-19 DIAGNOSIS — N920 Excessive and frequent menstruation with regular cycle: Secondary | ICD-10-CM | POA: Diagnosis not present

## 2017-08-19 MED ORDER — MEGESTROL ACETATE 40 MG PO TABS: ORAL_TABLET | ORAL | 3 refills | Status: DC

## 2017-08-19 MED FILL — MEGESTROL 40 MG TABLET: 40 | 36 days supply | Qty: 90 | Fill #0

## 2017-08-19 MED FILL — TROKENDI XR 50 MG CAPSULE: 50 | 30 days supply | Qty: 30 | Fill #1

## 2017-08-19 NOTE — Progress Notes (Signed)
   CLINIC ENCOUNTER NOTE  History:  51 y.o. [id number] here today for menorrhagia. She denies any abnormal vaginal discharge, bleeding, pelvic pain or other concerns.   Heavy periods. Changing pads every hour-- wears a thick diaper pad and a tampon.  Periods last 7-8 days Regular periods every month, sometimes every every 3 weeks Started menses at 51 yo, always heavy but they have worsened over her life The heaviest has changed dramatically in the last year.     Past Medical History:  Diagnosis Date  . 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 Fibroids   . Headache(784.0) 06/14/2007   Dr. Tomi Likens seeing pt. for HA management   . High blood pressure   . HYPERTENSION 06/14/2007  . Migraines   . Nodular goiter   . Obesity   . Vitamin D deficiency     Past Surgical History:  Procedure Laterality Date  . [place]   x2, with epidural & general anesth.   Marland Kitchen DILATION AND CURETTAGE OF UTERUS     x2  . THYROIDECTOMY  11/18/2016  . THYROIDECTOMY N/A 11/18/2016   Procedure: TOTAL THYROIDECTOMY;  Surgeon: Armandina Gemma, MD;  Location: [facility];  Service: General;  Laterality: N/A;    The following portions of the patient's history were reviewed and updated as appropriate: allergies, current medications, past family history, past medical history, past social history, past surgical history and problem list.   Health Maintenance:  Normal pap and negative HRHPV on 08/2016.  Normal mammogram on 07/2015.   Review of Systems:  Pertinent items noted in HPI and remainder of comprehensive ROS otherwise negative.   Objective:  Physical Exam BP (!) 157/100   Pulse 97   Ht 5\' 4"  (1.626 m)   Wt 257 lb (116.6 kg)   LMP 07/23/2017 (Approximate)   BMI 44.11 kg/m  Gen: well appearing CV: RR Lungs: normal WOB GU: deferred, heavy menses  Labs and Imaging No results found.  Assessment & Plan:   1. Menorrhagia with regular cycle - Patient having episodes of heavy menses currently. Will  have patient start megace to help with bleeding.  - megestrol (MEGACE) 40 MG tablet; Take two tablets three times daily for 3 days, then two tablets twice daily for 3 days, then one tablet twice daily  Dispense: 90 tablet; Refill: 3 - Will return when on maintenance dose, consider change to depo at that point - Next visit, plan to order/discuss TVUS and consider endometrial bx.   Routine preventative health maintenance measures emphasized. Please refer to After Visit Summary for other counseling recommendations.   Return in about 6 weeks (around 09/30/2017).

## 2017-08-19 NOTE — Progress Notes (Signed)
Uterine fibroids - heavy bleeding - changing pad every hour. She wears tampons and pads at the same time. This cycle has made her very nauseated and vomiting.

## 2017-08-20 ENCOUNTER — Other Ambulatory Visit: Payer: 59

## 2017-08-24 ENCOUNTER — Ambulatory Visit: Payer: 59 | Admitting: Endocrinology

## 2017-08-27 ENCOUNTER — Telehealth: Payer: 59 | Admitting: Physician Assistant

## 2017-08-27 DIAGNOSIS — R399 Unspecified symptoms and signs involving the genitourinary system: Secondary | ICD-10-CM | POA: Diagnosis not present

## 2017-08-27 DIAGNOSIS — N898 Other specified noninflammatory disorders of vagina: Secondary | ICD-10-CM | POA: Diagnosis not present

## 2017-08-27 MED ORDER — FLUCONAZOLE 150 MG PO TABS: 150.0000 mg | ORAL_TABLET | Freq: Once | ORAL | 0 refills | Status: AC

## 2017-08-27 MED ORDER — CEPHALEXIN 500 MG PO CAPS: 500.0000 mg | ORAL_CAPSULE | Freq: Two times a day (BID) | ORAL | 0 refills | Status: AC

## 2017-08-27 NOTE — Progress Notes (Signed)
We are sorry that you are not feeling well.  Here is how we plan to help!  Based on what you shared with me it looks like you most likely have a simple urinary tract infection.  A UTI (Urinary Tract Infection) is a bacterial infection of the bladder.  Most cases of urinary tract infections are simple to treat but a key part of your care is to encourage you to drink plenty of fluids and watch your symptoms carefully.  I have prescribed Keflex 500 mg twice a day for 7 days.  Your symptoms should gradually improve. Call us if the burning in your urine worsens, you develop worsening fever, back pain or pelvic pain or if your symptoms do not resolve after completing the antibiotic. I am also giving you a Diflucan for potential yeast from antibiotic.  Urinary tract infections can be prevented by drinking plenty of water to keep your body hydrated.  Also be sure when you wipe, wipe from front to back and don't hold it in!  If possible, empty your bladder every 4 hours.  Your e-visit answers were reviewed by a board certified advanced clinical practitioner to complete your personal care plan.  Depending on the condition, your plan could have included both over the counter or prescription medications.  If there is a problem please reply  once you have received a response from your provider.  Your safety is important to [name].  If you have drug allergies check your prescription carefully.    You can use MyChart to ask questions about today's visit, request a non-urgent call back, or ask for a work or school excuse for 24 hours related to this e-Visit. If it has been greater than 24 hours you will need to follow up with your provider, or enter a new e-Visit to address those concerns.   You will get an e-mail in the next two days asking about your experience.  I hope that your e-visit has been valuable and will speed your recovery. Thank you for using e-visits.

## 2017-08-28 MED FILL — LEVOTHYROXINE 112 MCG TAB: 112 | 30 days supply | Qty: 30 | Fill #2

## 2017-09-17 ENCOUNTER — Encounter: Payer: Self-pay | Admitting: Physician Assistant

## 2017-09-22 ENCOUNTER — Other Ambulatory Visit (INDEPENDENT_AMBULATORY_CARE_PROVIDER_SITE_OTHER): Payer: 59

## 2017-09-22 ENCOUNTER — Telehealth: Payer: Self-pay | Admitting: Physician Assistant

## 2017-09-22 DIAGNOSIS — E890 Postprocedural hypothyroidism: Secondary | ICD-10-CM

## 2017-09-22 DIAGNOSIS — I159 Secondary hypertension, unspecified: Secondary | ICD-10-CM

## 2017-09-22 LAB — T4, FREE: FREE T4: 0.91 ng/dL (ref 0.60–1.60)

## 2017-09-22 LAB — TSH: TSH: 11.71 u[IU]/mL — AB (ref 0.35–4.50)

## 2017-09-22 NOTE — Telephone Encounter (Signed)
[facility] sent fax asking to add an U/S renal in epic so they can schedule pt. Please advise. Thanks!

## 2017-09-23 ENCOUNTER — Encounter: Payer: Self-pay | Admitting: Endocrinology

## 2017-09-23 ENCOUNTER — Ambulatory Visit: Payer: 59 | Admitting: Endocrinology

## 2017-09-23 VITALS — BP 132/78 | HR 98 | Ht 64.0 in | Wt 258.4 lb

## 2017-09-23 DIAGNOSIS — E890 Postprocedural hypothyroidism: Secondary | ICD-10-CM

## 2017-09-23 MED ORDER — LEVOTHYROXINE SODIUM 137 MCG PO TABS: 137.0000 ug | ORAL_TABLET | Freq: Every day | ORAL | 3 refills | Status: DC

## 2017-09-23 MED FILL — LEVOTHYROXINE 137 MCG TAB: 137 | 30 days supply | Qty: 30 | Fill #0

## 2017-09-23 NOTE — Progress Notes (Signed)
Patient ID: [name], female   DOB: [birth year], 51 y.o.   MRN: [id number]           Reason for Appointment: Follow-up of low thyroid    History of Present Illness:   She had a total THYROIDECTOMY in 11/18/16 done for her multinodular goiter with symptoms of pressure and choking sensation in her neck and some difficulty with swallowing She was seen in follow-up after her surgery and her TSH was 11.7, at that time she was taking 88 g of levothyroxine Also was feeling significantly fatigued  RECENT history: She has been taking 112 g of levothyroxine She takes this in the early morning without food; she is now working day shifts   Since she has not adjusted to working day shifts she does tend to feel tired more recently anyway She does have some cold intolerance but no significant weight gain However she thinks she is getting a little hair loss  Wt Readings from Last 3 Encounters:  09/23/17 258 lb 6.4 oz (117.2 kg)  08/19/17 257 lb (116.6 kg)  07/27/17 260 lb (117.9 kg)    Despite taking her thyroid supplement regularly her TSH is now up to 11.71  Lab Results  Component Value Date   TSH 11.71 (H) 09/22/2017   TSH 1.26 02/17/2017   TSH 11.70 (A) 01/05/2017   FREET4 0.91 09/22/2017   FREET4 0.92 02/17/2017   FREET4 0.72 10/06/2016    Previous history: The patient's thyroid enlargement was first discovered in 03/2014 incidentally when the patient was at the urgent care center Her ultrasound showed a multinodular goiter  Since she had an increased size of the isthmus nodule this was evaluated with needle aspiration in 07/2016 and showed benign follicular adenoma  She  had an ultrasound exam in 10/16 showed multiple nodules with dominant solid nodule in the left isthmus measuring 2.8 cm, previously 2.6.    Other nodules were the same or minimally changed     Allergies as of 09/23/2017      Reactions   No Known Allergies       Medication List        Accurate as  of 09/23/17 10:31 AM. Always use your most recent med list.          acetaminophen 500 MG tablet Commonly known as:  TYLENOL Take 2 tablets (1,000 mg total) by mouth every 6 (six) hours as needed for headache.   atenolol-chlorthalidone 50-25 MG tablet Commonly known as:  TENORETIC Take 1 tablet by mouth daily.   diclofenac 75 MG EC tablet Commonly known as:  VOLTAREN Take 75 mg by mouth 2 (two) times daily as needed. For cramps   ibuprofen 200 MG tablet Commonly known as:  ADVIL,MOTRIN Take 800 mg by mouth every 8 (eight) hours as needed (for headache).   levothyroxine 112 MCG tablet Commonly known as:  SYNTHROID, LEVOTHROID TAKE 1 TABLET (112 MCG TOTAL) BY MOUTH DAILY BEFORE BREAKFAST.   megestrol 40 MG tablet Commonly known as:  MEGACE Take two tablets three times daily for 3 days, then two tablets twice daily for 3 days, then one tablet twice daily   promethazine 12.5 MG tablet Commonly known as:  PHENERGAN Take 1 tablet (12.5 mg total) by mouth every 8 (eight) hours as needed for nausea or vomiting.   SUMAtriptan 100 MG tablet Commonly known as:  IMITREX TAKE 1 TABLET BY MOUTH EVERY 2 HOURS AS NEEDED FOR MIGRAINE   Topiramate ER 50 MG Cp24 Commonly  known as:  TROKENDI XR Take 50 mg by mouth at bedtime.   ZOLMitriptan 2.5 MG Soln Commonly known as:  ZOMIG 1 spray in 1 nostril earliest onset of migraine. May repeat once in 2 hours if needed.       Allergies:  Allergies  Allergen Reactions  . No Known Allergies     Past Medical History:  Diagnosis Date  . 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 Fibroids   . Headache(784.0) 06/14/2007   Dr. Tomi Likens seeing pt. for HA management   . High blood pressure   . HYPERTENSION 06/14/2007  . Migraines   . Nodular goiter   . Obesity   . Vitamin D deficiency     Past Surgical History:  Procedure Laterality Date  . [place]   x2, with epidural & general anesth.   Marland Kitchen DILATION AND CURETTAGE OF UTERUS      x2  . THYROIDECTOMY  11/18/2016  . THYROIDECTOMY N/A 11/18/2016   Procedure: TOTAL THYROIDECTOMY;  Surgeon: Armandina Gemma, MD;  Location: [facility] OR;  Service: General;  Laterality: N/A;    Family History  Problem Relation Age of Onset  . Diabetes Mother   . Hypertension Mother   . Thyroid disease Mother     Social History:  reports that  has never smoked. she has never used smokeless tobacco. She reports that she does not drink alcohol or use drugs.      ROS  Hypertension:  Followed by PCP now  Has history of migraines followed by neurologist   Examination:   BP 132/78   Pulse 98   Ht 5\' 4"  (1.626 m)   Wt 258 lb 6.4 oz (117.2 kg)   LMP 09/14/2017   SpO2 97%   BMI 44.35 kg/m   She looks well Thyroid exam is normal Biceps reflexes slow slightly slow relaxation Skin appears normal  Assessment/Plan:  Postsurgical hypothyroidism  Although her TSH was earlier this year quite normal with taking 112 g of levothyroxine and she is very regular with taking her supplement every day her TSH is up again She is having more fatigue, some hair loss and cold intolerance  Discussed that most likely she is having further increase in her hypothyroidism  She will now go to the 137 g levothyroxine doses and start the new prescription which was sent Discussed taking this before breakfast and also not taking any calcium or iron-containing vitamins at the same time She will follow-up in 2 months again  HYPERTENSION: Better controlled now and she will continue to follow-up with PCP Recommended regular exercise for weight loss  Total visit time 15 minutes  There are no Patient Instructions on file for this visit.   [facility] 09/23/2017  Note: This office note was prepared with Dragon voice recognition system technology. Any transcriptional errors that result from this process are unintentional.

## 2017-09-24 ENCOUNTER — Encounter: Payer: Self-pay | Admitting: Endocrinology

## 2017-09-24 NOTE — Telephone Encounter (Signed)
I have placed. Philis Fendt, MS, PA-C 5:04 PM, 09/24/2017

## 2017-09-25 ENCOUNTER — Other Ambulatory Visit: Payer: Self-pay | Admitting: Endocrinology

## 2017-09-25 ENCOUNTER — Other Ambulatory Visit: Payer: Self-pay | Admitting: Physician Assistant

## 2017-09-25 DIAGNOSIS — I10 Essential (primary) hypertension: Secondary | ICD-10-CM

## 2017-09-25 NOTE — Telephone Encounter (Signed)
Spoke with Arena at [facility] to let her know about new U/S order. She said they were also needing just an U/S renal added (IMG 1080) but keep the U/S renal artery duplex in there as well. Thanks!

## 2017-09-25 NOTE — Telephone Encounter (Signed)
Please have them add so I can cosign.

## 2017-09-25 NOTE — Telephone Encounter (Signed)
Spoke with [facility]. They are unable to add the order themselves due to it being for diagnostic. They are only able to do this for [facility].

## 2017-09-27 ENCOUNTER — Other Ambulatory Visit: Payer: Self-pay | Admitting: Endocrinology

## 2017-09-27 DIAGNOSIS — Z6841 Body Mass Index (BMI) 40.0 and over, adult: Principal | ICD-10-CM

## 2017-09-30 ENCOUNTER — Ambulatory Visit: Payer: 59 | Admitting: Family Medicine

## 2017-10-02 MED FILL — ATENOLOL/CHLORTHAL 50/25: 50-25 | 90 days supply | Qty: 90 | Fill #0

## 2017-10-06 ENCOUNTER — Other Ambulatory Visit: Payer: Self-pay | Admitting: Physician Assistant

## 2017-10-10 ENCOUNTER — Encounter: Payer: Self-pay | Admitting: Physician Assistant

## 2017-10-10 ENCOUNTER — Ambulatory Visit (INDEPENDENT_AMBULATORY_CARE_PROVIDER_SITE_OTHER): Payer: 59 | Admitting: Physician Assistant

## 2017-10-10 VITALS — BP 127/89 | HR 80 | Temp 98.6°F | Resp 16 | Ht 64.75 in | Wt 259.0 lb

## 2017-10-10 DIAGNOSIS — I10 Essential (primary) hypertension: Secondary | ICD-10-CM

## 2017-10-10 MED ORDER — ATENOLOL-CHLORTHALIDONE 50-25 MG PO TABS: 1.0000 | ORAL_TABLET | Freq: Every day | ORAL | 3 refills | Status: DC

## 2017-10-10 NOTE — Progress Notes (Signed)
10/14/2017 8:29 AM   DOB: Aug 03, 1966 / MRN: [id number]  SUBJECTIVE:  [name] is a 51 y.o. female presenting for follow of HTN.  She feels well today and denies chest pain, SOB, DOE, new leg swelling.   She complains of chronic fatigue. This is a long standing problem. She does not want any labs drawn today. She is trying to lose weight. She is managed by endocrinology for hypothyroidism and the plan at this time is to increase her dose of synthroid.  She does not exercise formally.   She is allergic to no known allergies.   She  has a past medical history of EXOGENOUS OBESITY (03/14/2010), Fibroids, Headache(784.0) (06/14/2007), High blood pressure, HYPERTENSION (06/14/2007), Migraines, Nodular goiter, Obesity, and Vitamin D deficiency.    She  reports that  has never smoked. she has never used smokeless tobacco. She reports that she does not drink alcohol or use drugs. She  reports that she currently engages in sexual activity. She reports using the following method of birth control/protection: None. The patient  has a past surgical history that includes Dilation and curettage of uterus; Cesarean section ([place]); Thyroidectomy (11/18/2016); and Thyroidectomy (N/A, 11/18/2016).  Her family history includes Diabetes in her mother; Hypertension in her mother; Thyroid disease in her mother.  Review of Systems  Constitutional: Negative for chills and fever.  Skin: Negative for itching and rash.    The problem list and medications were reviewed and updated by myself where necessary and exist elsewhere in the encounter.   OBJECTIVE:  BP 127/89   Pulse 80   Temp 98.6 F (37 C) (Oral)   Resp 16   Ht 5' 4.75" (1.645 m)   Wt 259 lb (117.5 kg)   LMP 09/14/2017   SpO2 97%   BMI 43.43 kg/m   Physical Exam  Constitutional: She is oriented to person, place, and time. She appears well-developed and well-nourished. No distress.  Cardiovascular: Normal rate and regular rhythm.    Pulmonary/Chest: Effort normal and breath sounds normal.  Musculoskeletal: Normal range of motion.  Neurological: She is alert and oriented to person, place, and time.  Skin: She is not diaphoretic.    Lab Results  Component Value Date   WBC 9.2 07/24/2017   HGB 12.1 07/24/2017   HCT 37.2 07/24/2017   MCV 76 (L) 07/24/2017   PLT 359 07/24/2017    Lab Results  Component Value Date   CREATININE 0.81 07/24/2017   BUN 9 07/24/2017   NA 142 07/24/2017   K 4.0 07/24/2017   CL 103 07/24/2017   CO2 27 07/24/2017    Lab Results  Component Value Date   ALT 17 07/24/2017   AST 20 07/24/2017   ALKPHOS 95 07/24/2017   BILITOT <0.2 07/24/2017    Lab Results  Component Value Date   TSH 11.71 (H) 09/22/2017    Lab Results  Component Value Date   HGBA1C 6.0 (H) 10/06/2016    Lab Results  Component Value Date   CHOL 130 10/06/2016   HDL 43 10/06/2016   LDLCALC 68 10/06/2016   TRIG 95 10/06/2016   CHOLHDL 3.3 09/05/2016    No results found for this or any previous visit (from the past 72 hour(s)).  No results found.  ASSESSMENT AND PLAN:  Lupita was seen today for follow-up and immunizations.  Diagnoses and all orders for this visit:  Hypertension, unspecified type: Controlled. Given history of HTN at an early age and she was  not obese at the time of her diagnosis, will check a renal artery [name] and this is in. Will continue current regimen.  She refuses a blood draw today. She has no new symptoms.  Will see her back in two months.  -     atenolol-chlorthalidone (TENORETIC) 50-25 MG tablet; Take 1 tablet by mouth daily.  Other orders -     Cancel: Iron, TIBC and Ferritin Panel -     Cancel: Vitamin B12    The patient is advised to call or return to clinic if she does not see an improvement in symptoms, or to seek the care of the closest emergency department if she worsens with the above plan.   Philis Fendt, MHS, PA-C [facility] 10/14/2017 8:29 AM

## 2017-10-10 NOTE — Patient Instructions (Addendum)
Exercise improves every system in the body.  It lowers the risk of heart disease, decreases blood pressure, reduces the symptoms of depression and anxiety, and lowers blood sugar. To receive these benefits, try to get 150 minutes of planned exercise each week.  You can break this 150 minutes up however you like.  For instance, you can perform 30 minutes of brisk walking 5 days a week, or perform 50 minutes 3 days a week.  If you don't like walking, or can't find a safe place to walk, find another way to move that you can enjoy.  Exercise tapes, cycling, stair climbing, swimming, or a combination will be just as good as a walking program. To ensure the proper intensity, you can use the talk test. Essentially, you should be able to carry on a conversation, but you should have to take short breaks from the conversation in order catch your breath.     IF you received an x-ray today, you will receive an invoice from [facility]. Please contact [facility] at [phone] with questions or concerns regarding your invoice.   IF you received labwork today, you will receive an invoice from [facility]. Please contact [facility] at [phone] with questions or concerns regarding your invoice.   Our billing staff will not be able to assist you with questions regarding bills from these companies.  You will be contacted with the lab results as soon as they are available. The fastest way to get your results is to activate your My Chart account. Instructions are located on the last page of this paperwork. If you have not heard from us regarding the results in 2 weeks, please contact this office.     

## 2017-10-21 ENCOUNTER — Encounter: Payer: 59 | Attending: Endocrinology | Admitting: Skilled Nursing Facility1

## 2017-10-21 ENCOUNTER — Encounter: Payer: Self-pay | Admitting: Skilled Nursing Facility1

## 2017-10-21 DIAGNOSIS — Z713 Dietary counseling and surveillance: Secondary | ICD-10-CM | POA: Insufficient documentation

## 2017-10-21 DIAGNOSIS — Z6841 Body Mass Index (BMI) 40.0 and over, adult: Secondary | ICD-10-CM | POA: Insufficient documentation

## 2017-10-21 NOTE — Progress Notes (Signed)
  Assessment:  Primary concerns today: . Pt states she has been struggling with migraines since she was 52 years old. Pt has had a thyroidectomy. Pt states she wants to lose weight. Pt states her usual weight as an adult ha been 220 pounds. Pt states she wants to be 175 pounds stating she has not been this weight since many years ago. Pt states she has tried to lose weight before before: going to a bariatrician not liking the diet plan she was put on. Pt states she avoids processed foods. Pt states she had always worked nights and is now working days. Pt states she will start taking CoQ10, B2, and magnesium again.  Pt states she has a bowel movement every day. Pt states she has trouble sleeping possibly due to the hx of 3rd shift. Pt arrived with her supportive daughter.   To talk about next time: physical activity   MEDICATIONS: See List    DIETARY INTAKE:  Usual eating pattern includes 3 meals and 3 snacks per day.  Everyday foods include none stated.  Avoided foods include processed foods.    24-hr recall: eating out for dinner most days of the week B ( AM): smoothie from juice shop: juice, sherbert, fruit  Snk ( AM):  L ( PM): can of soup with chips or crackers or noodles  Snk ( PM): candy and chips D ( PM): fried fish and french fries or hot dogs  Snk ( PM):  Beverages: water with flavoring, splenda and flavored creamer coffee, soda, juice  Usual physical activity: ADL's  Estimated energy needs: 1600 calories 180 g carbohydrates 120 g protein 44 g fat  Progress Towards Goal(s):  In progress.    Intervention:  Nutrition counseling. Dietitian educated the pt on consuming a well balanced diet. Goals: -For [place] yogurt or any snack or drink: 9 grams or less of fat and sugar always read your serving size first -Avoid putting juice in your smoothie  -Work on having 3 balanced meals and a couple snacks if you need them -Work on having 64 fluid ounces  Teaching Method Utilized:   [employer] Hands on  Handouts given during visit include:  Meal Ideas  MyPlate  Barriers to learning/adherence to lifestyle change: none identified   Demonstrated degree of understanding via:  Teach Back   Monitoring/Evaluation:  Dietary intake, exercise,and body weight prn.

## 2017-10-21 NOTE — Patient Instructions (Addendum)
-  For [place] yogurt or any snack or drink: 9 grams or less of fat and sugar always read your serving size first  -Avoid putting juice in your smoothie   -Work on having 3 balanced meals and a couple snacks if you need them  -Work on having 64 fluid ounces

## 2017-10-24 ENCOUNTER — Other Ambulatory Visit (HOSPITAL_COMMUNITY)
Admit: 2017-10-24 | Discharge: 2017-10-24 | Disposition: A | Discharge status: Home / Self Care | Payer: 59 | Source: Other Acute Inpatient Hospital | Attending: Family Medicine | Admitting: Family Medicine

## 2017-10-24 ENCOUNTER — Ambulatory Visit (INDEPENDENT_AMBULATORY_CARE_PROVIDER_SITE_OTHER): Payer: 59 | Admitting: Family Medicine

## 2017-10-24 VITALS — BP 130/86 | HR 107 | Temp 98.7°F | Wt 260.0 lb

## 2017-10-24 DIAGNOSIS — N3000 Acute cystitis without hematuria: Secondary | ICD-10-CM | POA: Diagnosis not present

## 2017-10-24 DIAGNOSIS — R300 Dysuria: Secondary | ICD-10-CM | POA: Diagnosis not present

## 2017-10-24 LAB — POC URINALSYSI DIPSTICK (AUTOMATED)
Bilirubin, UA: NEGATIVE
Blood, UA: NEGATIVE
Glucose, UA: NEGATIVE
Ketones, UA: NEGATIVE
NITRITE UA: POSITIVE
PH UA: 6.5 (ref 5.0–8.0)
PROTEIN UA: NEGATIVE
Spec Grav, UA: 1.02 (ref 1.010–1.025)
UROBILINOGEN UA: 0.2 U/dL

## 2017-10-24 MED ORDER — SULFAMETHOXAZOLE-TRIMETHOPRIM 800-160 MG PO TABS: 1.0000 | ORAL_TABLET | Freq: Two times a day (BID) | ORAL | 0 refills | Status: DC

## 2017-10-24 MED ORDER — FLUCONAZOLE 150 MG PO TABS: 150.0000 mg | ORAL_TABLET | Freq: Once | ORAL | 0 refills | Status: AC

## 2017-10-24 NOTE — Patient Instructions (Signed)
Increase fluids.  Complete a course of antibiotics.  We will call with urine culture next week.

## 2017-10-24 NOTE — Assessment & Plan Note (Signed)
Send for culture.. Cover with 3 days of bactrim.

## 2017-10-24 NOTE — Progress Notes (Signed)
   Subjective:    Patient ID: [name], female    DOB: [birth year], 52 y.o.   MRN: [id number]  Dysuria   This is a new problem. The current episode started in the past 7 days. The problem occurs every urination. The problem has been gradually worsening. The quality of the pain is described as burning. The pain is mild. She is not sexually active. There is no history of pyelonephritis. Associated symptoms include a discharge, frequency and urgency. Pertinent negatives include no chills, flank pain, hematuria, nausea, possible pregnancy or vomiting. Associated symptoms comments: Lower central abdominal pain.  Vaginal itching and discharge as well.. She has tried increased fluids ( AZo) for the symptoms. The treatment provided no relief. There is no history of catheterization, kidney stones, recurrent UTIs, a single kidney or a urological procedure.  Urinary Frequency   Associated symptoms include a discharge, frequency and urgency. Pertinent negatives include no chills, flank pain, hematuria, nausea, possible pregnancy or vomiting. There is no history of catheterization, kidney stones, recurrent UTIs, a single kidney or a urological procedure.    Last UTI 3 months ago. Resolved completely.  No culture done. Keflex 500 mg twice a day for 7 days  diflucan also.  Blood pressure 130/86, pulse (!) 107, temperature 98.7 F (37.1 C), temperature source Oral, weight 260 lb (117.9 kg), SpO2 97 %.   Review of Systems  Constitutional: Negative for chills.  Gastrointestinal: Negative for nausea and vomiting.  Genitourinary: Positive for dysuria, frequency and urgency. Negative for flank pain and hematuria.       Objective:   Physical Exam  Constitutional: Vital signs are normal. She appears well-developed and well-nourished. She is cooperative.  Non-toxic appearance. She does not appear ill. No distress.  HENT:  Head: Normocephalic.  Right Ear: Hearing, tympanic membrane, external ear and ear  canal normal. Tympanic membrane is not erythematous, not retracted and not bulging.  Left Ear: Hearing, tympanic membrane, external ear and ear canal normal. Tympanic membrane is not erythematous, not retracted and not bulging.  Nose: No mucosal edema or rhinorrhea. Right sinus exhibits no maxillary sinus tenderness and no frontal sinus tenderness. Left sinus exhibits no maxillary sinus tenderness and no frontal sinus tenderness.  Mouth/Throat: Uvula is midline, oropharynx is clear and moist and mucous membranes are normal.  Eyes: Conjunctivae, EOM and lids are normal. Pupils are equal, round, and reactive to light. Lids are everted and swept, no foreign bodies found.  Neck: Trachea normal and normal range of motion. Neck supple. Carotid bruit is not present. No thyroid mass and no thyromegaly present.  Cardiovascular: Normal rate, regular rhythm, S1 normal, S2 normal, normal heart sounds, intact distal pulses and normal pulses. Exam reveals no gallop and no friction rub.  No murmur heard. Pulmonary/Chest: Effort normal and breath sounds normal. No tachypnea. No respiratory distress. She has no decreased breath sounds. She has no wheezes. She has no rhonchi. She has no rales.  Abdominal: Soft. Normal appearance and bowel sounds are normal. There is no hepatosplenomegaly. There is tenderness in the suprapubic area. There is no CVA tenderness.  Neurological: She is alert.  Skin: Skin is warm, dry and intact. No rash noted.  Psychiatric: Her speech is normal and behavior is normal. Judgment and thought content normal. Her mood appears not anxious. Cognition and memory are normal. She does not exhibit a depressed mood.          Assessment & Plan:

## 2017-10-24 NOTE — Addendum Note (Signed)
Addended by: Lurlean Nanny on: 10/24/2017 01:49 PM   Modules accepted: Orders

## 2017-10-24 NOTE — Addendum Note (Signed)
Addended by: Eliezer Lofts E on: 10/24/2017 01:21 PM   Modules accepted: Orders

## 2017-10-26 LAB — URINE CULTURE: Culture: 20000 — AB

## 2017-10-28 ENCOUNTER — Encounter: Payer: Self-pay | Admitting: Family Medicine

## 2017-10-28 ENCOUNTER — Telehealth: Payer: Self-pay | Admitting: Radiology

## 2017-10-28 ENCOUNTER — Ambulatory Visit: Payer: 59 | Admitting: Family Medicine

## 2017-10-28 VITALS — BP 105/78 | HR 70 | Wt 253.2 lb

## 2017-10-28 DIAGNOSIS — N920 Excessive and frequent menstruation with regular cycle: Secondary | ICD-10-CM | POA: Diagnosis not present

## 2017-10-28 DIAGNOSIS — N946 Dysmenorrhea, unspecified: Secondary | ICD-10-CM

## 2017-10-28 MED ORDER — DICLOFENAC SODIUM 75 MG PO TBEC: 75.0000 mg | DELAYED_RELEASE_TABLET | Freq: Two times a day (BID) | ORAL | 2 refills | Status: DC | PRN

## 2017-10-28 MED FILL — DICLOFENAC SODIUM 75 MG TAB: 75 | 30 days supply | Qty: 60 | Fill #0

## 2017-10-28 NOTE — Progress Notes (Signed)
   CLINIC ENCOUNTER NOTE  History:  52 y.o. [id number] here today for f/u menorrhagia.  Megace has "helped a lot" and she didn't know "a pill could be so effective" She denies any abnormal vaginal discharge, bleeding, pelvic pain or other concerns.   Since last visit, periods have been as follows Nov- 2-3 days Dec- No period, only cramps Jan- Typically comes at the end of month-- expecting at the end of month  She continues to take Megace 40mg  per day   Past Medical History:  Diagnosis Date  . 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 Fibroids   . Headache(784.0) 06/14/2007   Dr. Tomi Likens seeing pt. for HA management   . High blood pressure   . HYPERTENSION 06/14/2007  . Migraines   . Nodular goiter   . Obesity   . Vitamin D deficiency     Past Surgical History:  Procedure Laterality Date  . [place]   x2, with epidural & general anesth.   Marland Kitchen DILATION AND CURETTAGE OF UTERUS     x2  . THYROIDECTOMY  11/18/2016  . THYROIDECTOMY N/A 11/18/2016   Procedure: TOTAL THYROIDECTOMY;  Surgeon: Armandina Gemma, MD;  Location: [facility];  Service: General;  Laterality: N/A;    The following portions of the patient's history were reviewed and updated as appropriate: allergies, current medications, past family history, past medical history, past social history, past surgical history and problem list.   Health Maintenance:  Normal pap and negative HRHPV on 08/2016.  Normal mammogram on 07/2015.   Review of Systems:  Pertinent items noted in HPI and remainder of comprehensive ROS otherwise negative.   Objective:  Physical Exam BP 105/78   Pulse 70   Wt 253 lb 3.2 oz (114.9 kg)   LMP 08/20/2017   BMI 43.46 kg/m  Gen: well appearing CV: RR Lungs: normal WOB  Labs and Imaging No results found.  Assessment & Plan:   1. Menorrhagia with regular cycl Stop  Megace in the end of Jan-- recommended gradual reduction.  TVUS in Feb to assess fibroids and uterine lining Return in March  to discuss bleeding,discussed possible endometrial bx at that time if bleeding resumes and is heavy or TVUS shows   2. Recurrent UTI - UTI in Nov and Jan  - discussed coming for UA if sx in the future and possible suppressive therapy  Routine preventative health maintenance measures emphasized. Please refer to After Visit Summary for other counseling recommendations.   Return in about 8 weeks (around 12/23/2017) for f/u AUB and discuss [name].

## 2017-10-28 NOTE — Telephone Encounter (Signed)
Left message on the cell phone voicemail patient has f/u with Dr Ernestina Patches on 12/23/17 @ 8:15

## 2017-10-29 MED FILL — LEVOTHYROXINE 137 MCG TABLE: 137 | 30 days supply | Qty: 30 | Fill #1

## 2017-10-30 ENCOUNTER — Encounter: Payer: Self-pay | Admitting: Neurology

## 2017-10-30 ENCOUNTER — Ambulatory Visit (INDEPENDENT_AMBULATORY_CARE_PROVIDER_SITE_OTHER): Payer: 59 | Admitting: Neurology

## 2017-10-30 VITALS — BP 134/82 | HR 110 | Ht 65.0 in | Wt 258.4 lb

## 2017-10-30 DIAGNOSIS — G43009 Migraine without aura, not intractable, without status migrainosus: Secondary | ICD-10-CM | POA: Diagnosis not present

## 2017-10-30 MED ORDER — DICLOFENAC POTASSIUM(MIGRAINE) 50 MG PO PACK: 50.0000 mg | PACK | ORAL | 3 refills | Status: DC

## 2017-10-30 NOTE — Progress Notes (Signed)
NEUROLOGY FOLLOW UP OFFICE NOTE  [name] [id number]  HISTORY OF PRESENT ILLNESS: [name] is a 52 year old left-handed female with hypertension and post-surgical hypothyroidism who follows up for migraines.     UPDATE: She stopped topiramate ER because she really wants to limit prescription medication.  Zomig NS effective but does not like nasal spray.  Imitrex injection effective but would rather avoid injections. Intensity:  Mild 4/10, severe 8/10 Duration:  4 to 6 hours Frequency:  Severe migraines rare but mild-moderate ones occur once a week Abortive protocol:  Sumatriptan injection with Tylenol 500mg   Current NSAIDS:  naproxen(effective for mild), diclofenac 75mg  (for fibroid pain) Current analgesics:  Tylenol (partially effective) Current triptans:  Zomig 2.5mg  NS (effective but did not like nasal spray) Current anti-emetic:  promethazine 12.5mg  Current muscle relaxants:  no Current anti-anxiolytic:  no Current sleep aide:  no Antihypertensive medications:  metoprolol 50mg  Antidepressant medications:  no Anticonvulsant medications:  no Vitamins/Herbal/Supplements:  no   Caffeine:  Coffee usually only for headache treatment Alcohol:  no Smoker:  no Diet:  Seeing a nutritionist.  Drinks water Exercise:  no Depression/stress:  Some stress Sleep hygiene:  Worse since change to day shift but has improved.   HISTORY: Onset:  She has had migraines since age 11, however they have steadily gotten worse over the past year and have been daily since last week. Location:  Varies (either side, holocephalic).  Over the past week, she has had left sided neck pain Quality:  pounding Initial Intensity:  10/10; December: 4/10 mild, 10/10 severe Aura:  no Prodrome:  no Associated symptoms:  Nausea, photophobia, sometimes vomiting Initial Duration:  Several hours to 2 days Initial Frequency:  Usually 2-3 times a month Triggers/exacerbating factors:  cycle Relieving  factors:  Sleep, medication Activity:  Cannot function   Past abortive medication:  Tylenol, sumatriptan NS, sumatriptan 100mg  tablet, Relpax, ibuprofen 800mg , caffeine or hydrocodone, rizatriptan, Zembrace SymTouch (effective but makes her drowsy), sumatriptan 100mg  tablet (ineffective) Past preventative medication:  nortriptyline 10mg  (concerned about weight gain) Other past therapy:  none   Family history of headache:  No  PAST MEDICAL HISTORY: Past Medical History:  Diagnosis Date  . 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 Fibroids   . Headache(784.0) 06/14/2007   Dr. Tomi Likens seeing pt. for HA management   . High blood pressure   . HYPERTENSION 06/14/2007  . Migraines   . Nodular goiter   . Obesity   . Vitamin D deficiency     MEDICATIONS: Current Outpatient Medications on File Prior to Visit  Medication Sig Dispense Refill  . atenolol-chlorthalidone (TENORETIC) 50-25 MG tablet Take 1 tablet by mouth daily. (Patient not taking: Reported on 10/28/2017) 90 tablet 3  . diclofenac (VOLTAREN) 75 MG EC tablet Take 1 tablet (75 mg total) by mouth 2 (two) times daily as needed. For cramps 60 tablet 2  . ibuprofen (ADVIL,MOTRIN) 200 MG tablet Take 800 mg by mouth every 8 (eight) hours as needed (for headache).    Marland Kitchen levothyroxine (SYNTHROID) 137 MCG tablet Take 1 tablet (137 mcg total) by mouth daily before breakfast. 30 tablet 3  . megestrol (MEGACE) 40 MG tablet Take two tablets three times daily for 3 days, then two tablets twice daily for 3 days, then one tablet twice daily 90 tablet 3  . promethazine (PHENERGAN) 12.5 MG tablet Take 1 tablet (12.5 mg total) by mouth every 8 (eight) hours as needed for nausea or vomiting. (Patient not taking: Reported on  10/28/2017) 30 tablet 3  . sulfamethoxazole-trimethoprim (BACTRIM DS,SEPTRA DS) 800-160 MG tablet Take 1 tablet by mouth 2 (two) times daily. (Patient not taking: Reported on 10/28/2017) 6 tablet 0  . SUMAtriptan (IMITREX) 100 MG tablet TAKE 1 TABLET BY  MOUTH EVERY 2 HOURS AS NEEDED FOR MIGRAINE (Patient not taking: Reported on 10/28/2017) 10 tablet 3  . Topiramate ER (TROKENDI XR) 50 MG CP24 Take 50 mg by mouth at bedtime. (Patient not taking: Reported on 10/28/2017) 30 capsule 3  . ZOLMitriptan (ZOMIG) 2.5 MG SOLN 1 spray in 1 nostril earliest onset of migraine. May repeat once in 2 hours if needed. (Patient not taking: Reported on 10/28/2017) 10 each 2   No current facility-administered medications on file prior to visit.     ALLERGIES: Allergies  Allergen Reactions  . No Known Allergies     FAMILY HISTORY: Family History  Problem Relation Age of Onset  . Diabetes Mother   . Hypertension Mother   . Thyroid disease Mother     SOCIAL HISTORY: Social History   Socioeconomic History  . Marital status: Married    Spouse name: Not on file  . Number of children: 3  . Years of education: Not on file  . Highest education level: Not on file  Social Needs  . Financial resource strain: Not on file  . Food insecurity - worry: Not on file  . Food insecurity - inability: Not on file  . Transportation needs - medical: Not on file  . Transportation needs - non-medical: Not on file  Occupational History  . Occupation: Airline pilot: [employer]  Tobacco Use  . Smoking status: Never Smoker  . Smokeless tobacco: Never Used  Substance and Sexual Activity  . Alcohol use: No  . Drug use: No  . Sexual activity: Yes    Birth control/protection: None  Other Topics Concern  . Not on file  Social History Narrative  . Not on file    REVIEW OF SYSTEMS: Constitutional: No fevers, chills, or sweats, no generalized fatigue, change in appetite Eyes: No visual changes, double vision, eye pain Ear, nose and throat: No hearing loss, ear pain, nasal congestion, sore throat Cardiovascular: No chest pain, palpitations Respiratory:  No shortness of breath at rest or with exertion, wheezes GastrointestinaI: No nausea, vomiting,  diarrhea, abdominal pain, fecal incontinence Genitourinary:  No dysuria, urinary retention or frequency Musculoskeletal:  No neck pain, back pain Integumentary: No rash, pruritus, skin lesions Neurological: as above Psychiatric: No depression, insomnia, anxiety Endocrine: No palpitations, fatigue, diaphoresis, mood swings, change in appetite, change in weight, increased thirst Hematologic/Lymphatic:  No purpura, petechiae. Allergic/Immunologic: no itchy/runny eyes, nasal congestion, recent allergic reactions, rashes  PHYSICAL EXAM: Vitals:   10/30/17 0737  BP: 134/82  Pulse: (!) 110  SpO2: 97%   General: No acute distress.  Patient appears well-groomed.  Morbid obesity body habitus. Head:  Normocephalic/atraumatic Eyes:  Fundi examined but not visualized Neck: supple, no paraspinal tenderness, full range of motion Heart:  Regular rate and rhythm Lungs:  Clear to auscultation bilaterally Back: No paraspinal tenderness Neurological Exam: alert and oriented to person, place, and time. Attention span and concentration intact, recent and remote memory intact, fund of knowledge intact.  Speech fluent and not dysarthric, language intact.  CN II-XII intact. Bulk and tone normal, muscle strength 5/5 throughout.  Sensation to light touch  intact.  Deep tendon reflexes 2+ throughout.  Finger to nose testing intact.  Gait normal  IMPRESSION:  Migraine Morbid obesity (BMI 43)  PLAN: 1.  We will avoid pharmacologic therapy for migraine prevention:  - Magnesium citrate, riboflavin, CoQ10  - Sleep hygiene reviewed  - Stressed exercise  -  Continue diet/weight loss with nutritionist 2.  For abortive therapy, she will try Cambia as first line (not to take with the pill) and may use Imitrex [place] as second line  Metta Clines, DO  CC: Dr. Burnice Logan

## 2017-10-30 NOTE — Patient Instructions (Signed)
Migraine Recommendations: 1.  Take magnesium citrate 400mg  to 600mg  daily, riboflavin (B2) 400mg  daily and Coenzyme Q10 100mg  three times daily 2.  When you get a migraine, take the Cambia powder mixed with water.  You may use the sumatriptan injection. 3.  Limit use of pain relievers to no more than 2 days out of the week.  These medications include acetaminophen, ibuprofen, triptans and narcotics.  This will help reduce risk of rebound headaches. 4.  Be aware of common food triggers such as processed sweets, processed foods with nitrites (such as deli meat, hot dogs, sausages), foods with MSG, alcohol (such as wine), chocolate, certain cheeses, certain fruits (dried fruits, bananas, some citrus fruit), vinegar, diet soda. 4.  Avoid caffeine 5.  Routine exercise 6.  Proper sleep hygiene 7.  Stay adequately hydrated with water 8.  Keep a headache diary. 9.  Maintain proper stress management. 10.  Do not skip meals. 11.  Follow up in 3 months.

## 2017-11-11 ENCOUNTER — Ambulatory Visit (HOSPITAL_COMMUNITY)
Admission: RE | Admit: 2017-11-11 | Discharge: 2017-11-11 | Disposition: A | Discharge status: Home / Self Care | Payer: 59 | Source: Ambulatory Visit | Attending: Family Medicine | Admitting: Family Medicine

## 2017-11-11 DIAGNOSIS — N852 Hypertrophy of uterus: Secondary | ICD-10-CM | POA: Insufficient documentation

## 2017-11-11 DIAGNOSIS — N920 Excessive and frequent menstruation with regular cycle: Secondary | ICD-10-CM | POA: Diagnosis not present

## 2017-11-11 DIAGNOSIS — D259 Leiomyoma of uterus, unspecified: Secondary | ICD-10-CM | POA: Insufficient documentation

## 2017-11-11 DIAGNOSIS — D252 Subserosal leiomyoma of uterus: Secondary | ICD-10-CM | POA: Diagnosis not present

## 2017-11-19 ENCOUNTER — Other Ambulatory Visit (INDEPENDENT_AMBULATORY_CARE_PROVIDER_SITE_OTHER): Payer: 59

## 2017-11-19 DIAGNOSIS — E890 Postprocedural hypothyroidism: Secondary | ICD-10-CM

## 2017-11-19 LAB — T4, FREE: FREE T4: 1.26 ng/dL (ref 0.60–1.60)

## 2017-11-19 LAB — TSH: TSH: 1.46 u[IU]/mL (ref 0.35–4.50)

## 2017-11-25 ENCOUNTER — Encounter: Payer: Self-pay | Admitting: Endocrinology

## 2017-11-25 ENCOUNTER — Ambulatory Visit: Payer: 59 | Admitting: Endocrinology

## 2017-11-25 VITALS — BP 128/76 | HR 79 | Ht 65.0 in | Wt 258.2 lb

## 2017-11-25 DIAGNOSIS — E890 Postprocedural hypothyroidism: Secondary | ICD-10-CM | POA: Diagnosis not present

## 2017-11-25 MED ORDER — LORCASERIN HCL ER 20 MG PO TB24: 20.0000 mg | ORAL_TABLET | Freq: Every day | ORAL | 1 refills | Status: DC

## 2017-11-25 MED ORDER — PHENTERMINE-TOPIRAMATE ER 3.75-23 MG PO CP24: ORAL_CAPSULE | ORAL | 0 refills | Status: DC

## 2017-11-25 NOTE — Progress Notes (Signed)
Patient ID: [name], female   DOB: December 11, 1965, 52 y.o.   MRN: [id number]           Reason for Appointment: Follow-up of low thyroid    History of Present Illness:   She had a total THYROIDECTOMY in 11/18/16 done for her multinodular goiter with symptoms of pressure and choking sensation in her neck and some difficulty with swallowing She was seen in follow-up after her surgery and her TSH was 11.7, at that time she was taking 88 g of levothyroxine Also was feeling significantly fatigued  RECENT history: She has been taking 137 g of levothyroxine since 09/2017 Previously with 112 g of levothyroxine her TSH had gone up 11.7 but not clear if she was symptomatic  More recently she thinks she is not quite as tired and her hair loss is less but she think she gets cold in her bones She takes this in the early morning without food; she is now working day shifts   Her TSH is back to normal  Lab Results  Component Value Date   TSH 1.46 11/19/2017   TSH 11.71 (H) 09/22/2017   TSH 1.26 02/17/2017   FREET4 1.26 11/19/2017   FREET4 0.91 09/22/2017   FREET4 0.92 02/17/2017    Previous history: The patient's thyroid enlargement was first discovered in 03/2014 incidentally when the patient was at the urgent care center Her ultrasound showed a multinodular goiter  Since she had an increased size of the isthmus nodule this was evaluated with needle aspiration in 07/2016 and showed benign follicular adenoma  She  had an ultrasound exam in 10/16 showed multiple nodules with dominant solid nodule in the left isthmus measuring 2.8 cm, previously 2.6.    Other nodules were the same or minimally changed   OBESITY:  She is complaining about difficulty with losing weight She has seen a dietitian about a month ago and she does not think her weight changes much despite trying to improve her diet She does not find time or energy to exercise She is asking for weight loss medication   Wt  Readings from Last 3 Encounters:  11/25/17 258 lb 3.2 oz (117.1 kg)  10/30/17 258 lb 6.4 oz (117.2 kg)  10/28/17 253 lb 3.2 oz (114.9 kg)    Allergies as of 11/25/2017      Reactions   No Known Allergies       Medication List        Accurate as of 11/25/17  9:14 AM. Always use your most recent med list.          atenolol-chlorthalidone 50-25 MG tablet Commonly known as:  TENORETIC Take 1 tablet by mouth daily.   diclofenac 75 MG EC tablet Commonly known as:  VOLTAREN Take 1 tablet (75 mg total) by mouth 2 (two) times daily as needed. For cramps   Diclofenac Potassium 50 MG Pack Take 50 mg by mouth as directed.   ibuprofen 200 MG tablet Commonly known as:  ADVIL,MOTRIN Take 800 mg by mouth every 8 (eight) hours as needed (for headache).   levothyroxine 137 MCG tablet Commonly known as:  SYNTHROID Take 1 tablet (137 mcg total) by mouth daily before breakfast.   megestrol 40 MG tablet Commonly known as:  MEGACE Take two tablets three times daily for 3 days, then two tablets twice daily for 3 days, then one tablet twice daily   promethazine 12.5 MG tablet Commonly known as:  PHENERGAN Take 1 tablet (12.5 mg total)  by mouth every 8 (eight) hours as needed for nausea or vomiting.   SUMAtriptan 100 MG tablet Commonly known as:  IMITREX TAKE 1 TABLET BY MOUTH EVERY 2 HOURS AS NEEDED FOR MIGRAINE       Allergies:  Allergies  Allergen Reactions  . No Known Allergies     Past Medical History:  Diagnosis Date  . 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 Fibroids   . Headache(784.0) 06/14/2007   Dr. Tomi Likens seeing pt. for HA management   . High blood pressure   . HYPERTENSION 06/14/2007  . Migraines   . Nodular goiter   . Obesity   . Vitamin D deficiency     Past Surgical History:  Procedure Laterality Date  . [place]   x2, with epidural & general anesth.   Marland Kitchen DILATION AND CURETTAGE OF UTERUS     x2  . THYROIDECTOMY  11/18/2016  . THYROIDECTOMY N/A  11/18/2016   Procedure: TOTAL THYROIDECTOMY;  Surgeon: Armandina Gemma, MD;  Location: [facility];  Service: General;  Laterality: N/A;    Family History  Problem Relation Age of Onset  . Diabetes Mother   . Hypertension Mother   . Thyroid disease Mother     Social History:  reports that  has never smoked. she has never used smokeless tobacco. She reports that she does not drink alcohol or use drugs.      ROS  Hypertension:  Followed by PCP    Has history of migraines followed by neurologist  No history of diabetes  Lab Results  Component Value Date   HGBA1C 6.0 (H) 10/06/2016      Examination:   BP 128/76 (BP Location: Left Arm, Patient Position: Sitting, Cuff Size: Large)   Pulse 79   Ht 5\' 5"  (1.651 m)   Wt 258 lb 3.2 oz (117.1 kg)   SpO2 97%   BMI 42.97 kg/m   She looks well   Assessment/Plan:  Postsurgical hypothyroidism  With taking thyroid supplementation she is feeling fairly well recently Her dose was increased We assured her that her dose of 137 g is not excessive for her weight She has symptomatically a little better and TSH is back to normal  Also reassured her that her weight gain is not related to hypothyroidism  HYPERTENSION: Followed by PCP  OBESITY: Patient wants to take a weight loss medication 3 days although she is not understanding the role of weight loss medications She is exercising She thinks she is trying to follow the instructions given by dietitian and her portions are not excessive.  Discussed options for various weight loss medications and she will try to see whether Qsymia on [place] are covered, Discussed that these medications will need to be adjusted periodically and she will need follow-up to make sure she is getting improved efficacy with weight loss with these She does need to start an exercise program  Total visit time 15 minutes  There are no Patient Instructions on file for this visit.   Elayne Snare 11/25/2017  Note:  This office note was prepared with Dragon voice recognition system technology. Any transcriptional errors that result from this process are unintentional.

## 2017-11-25 NOTE — Patient Instructions (Signed)
Exercise

## 2017-11-27 NOTE — Telephone Encounter (Signed)
°  Pt sent a message through my chart. She is wondering how long a PA will take and she is also wondering if we have the coupon so she can try the following script for free for 14 days.  Please advise Pt states we can leave detailed message on Voicemail .  Phentermine-Topiramate (QSYMIA) 3.75-23 MG CP24

## 2017-12-01 MED FILL — MEGESTROL 40 MG TABLET: 40 | 36 days supply | Qty: 90 | Fill #1

## 2017-12-01 MED FILL — LEVOTHYROXINE 137 MCG TABLE: 137 | 30 days supply | Qty: 30 | Fill #2

## 2017-12-02 ENCOUNTER — Ambulatory Visit: Payer: 59 | Admitting: Family Medicine

## 2017-12-02 ENCOUNTER — Encounter: Payer: Self-pay | Admitting: Family Medicine

## 2017-12-02 ENCOUNTER — Ambulatory Visit: Payer: 59 | Admitting: Skilled Nursing Facility1

## 2017-12-02 VITALS — BP 155/81 | HR 72 | Wt 255.2 lb

## 2017-12-02 DIAGNOSIS — Z6841 Body Mass Index (BMI) 40.0 and over, adult: Secondary | ICD-10-CM

## 2017-12-02 DIAGNOSIS — N920 Excessive and frequent menstruation with regular cycle: Secondary | ICD-10-CM | POA: Diagnosis not present

## 2017-12-02 NOTE — Progress Notes (Signed)
CLINIC ENCOUNTER NOTE  History:  52 y.o. [id number] here today for f/u menorrhagia. Has TVUS which showed enlarged uterus (21.3 x 11.9 x 16.0 cm) with multiple large fibroids, largest 7.6cm. She continues to take Megace 40mg  per day   Past Medical History:  Diagnosis Date  . 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 Fibroids   . Headache(784.0) 06/14/2007   Dr. Tomi Likens seeing pt. for HA management   . High blood pressure   . HYPERTENSION 06/14/2007  . Migraines   . Nodular goiter   . Obesity   . Vitamin D deficiency     Past Surgical History:  Procedure Laterality Date  . [place]   x2, with epidural & general anesth.   [facility] Kitchen DILATION AND CURETTAGE OF UTERUS     x2  . THYROIDECTOMY  11/18/2016  . THYROIDECTOMY N/A 11/18/2016   Procedure: TOTAL THYROIDECTOMY;  Surgeon: Armandina Gemma, MD;  Location: [facility];  Service: General;  Laterality: N/A;    The following portions of the patient's history were reviewed and updated as appropriate: allergies, current medications, past family history, past medical history, past social history, past surgical history and problem list.   Health Maintenance:  Normal pap and negative HRHPV on 08/2016.  Normal mammogram on 07/2015.   Review of Systems:  Pertinent items noted in HPI and remainder of comprehensive ROS otherwise negative.   Objective:  Physical Exam BP (!) 155/81   Pulse 72   Wt 255 lb 3.2 oz (115.8 kg)   BMI 42.47 kg/m  Gen: well appearing CV: RR Lungs: normal WOB  Labs and Imaging US Pelvic Complete With Transvaginal  Result Date: 11/11/2017 CLINICAL DATA:  Follow-up fibroids.  Abnormal uterine bleeding EXAM: TRANSABDOMINAL AND TRANSVAGINAL ULTRASOUND OF PELVIS TECHNIQUE: Both transabdominal and transvaginal ultrasound examinations of the pelvis were performed. Transabdominal technique was performed for global imaging of the pelvis including uterus, ovaries, adnexal regions, and pelvic cul-de-sac. It was necessary to  proceed with endovaginal exam following the transabdominal exam to visualize the uterus, endometrium, ovaries and adnexa. COMPARISON:  None FINDINGS: Uterus Measurements: 21.3 x 11.9 x 16.0 cm. Numerous fibroids, including a 5.0 cm fundal fibroid, 7.3 cm left fundal fibroid posteriorly, and 7.6 cm right fundal fibroid posteriorly. These are all subserosal in location. Diffuse heterogeneous echotexture throughout the remainder of the uterus with likely other smaller focal fibroids. Endometrium Thickness: Unable to visualize due to fibroids. Right ovary Measurements: Not visualized.  No adnexal mass seen. Left ovary Measurements: Not visualized.  No adnexal mass seen. Other findings No abnormal free fluid. IMPRESSION: Enlarged fibroid uterus. Endometrium not visible due to fibroid disease. Electronically Signed   By: Rolm Baptise M.D.   On: 11/11/2017 10:06    Assessment & Plan:   1. Menorrhagia with regular cycle - likely due to fibroids. Has worsening cramping and lower abdominal pain. - Patient wanted to discuss possible hysterectomy. We reviewed her [name] and we discussed having a consult with a surgeon vs endometrial sampling with LNG IUD vs continued megace. Patient interested in discussion with GYN surgeon  2. HCM - has not had CRC screening-- recommended having screening with FOBT or colonscopy  >50% of this 25 minute appointment was discussing options for treatment, values based decision making and reviewing results.   Routine preventative health maintenance measures emphasized. Please refer to After Visit Summary for other counseling recommendations.   Return in about 2 weeks (around 12/16/2017) for consult with GYN surgeon re: hysterectomy.  Future Appointments  Date Time Provider [place]  12/18/2017 10:00 AM Ardelle Lesches, RD [facility]  12/25/2017  8:45 AM [name], MD [facility]  01/22/2018  9:15 AM Elayne Snare, MD [facility] None  01/29/2018  7:30 AM Pieter Partridge, DO [facility] None

## 2017-12-07 ENCOUNTER — Other Ambulatory Visit: Payer: Self-pay | Admitting: Endocrinology

## 2017-12-07 MED ORDER — PHENTERMINE HCL 15 MG PO CAPS: 15.0000 mg | ORAL_CAPSULE | ORAL | 1 refills | Status: DC

## 2017-12-07 MED FILL — PHENTERMINE 15 MG CAPSULE: 15 | 30 days supply | Qty: 30 | Fill #0

## 2017-12-18 ENCOUNTER — Encounter: Payer: Self-pay | Admitting: Registered"

## 2017-12-18 ENCOUNTER — Encounter: Payer: 59 | Attending: Endocrinology | Admitting: Registered"

## 2017-12-18 DIAGNOSIS — Z713 Dietary counseling and surveillance: Secondary | ICD-10-CM | POA: Diagnosis not present

## 2017-12-18 DIAGNOSIS — Z6841 Body Mass Index (BMI) 40.0 and over, adult: Secondary | ICD-10-CM | POA: Diagnosis not present

## 2017-12-18 NOTE — Patient Instructions (Addendum)
-   Aim for more well-balanced meals.   - Make lunch more of a meal: leftovers from dinner or salad with protein options.   - Aim to increase physical activity with DVD or youtube videos 30 min, 2 days/week on Fri and Sun.

## 2017-12-18 NOTE — Progress Notes (Signed)
  Assessment:  Primary concerns today:  Pt states she has been working night shift since 2000 and now working day shift. Pt states she is still adjusting to new hours. Pt states she works 8:30pm-7:30pm, 4 days/week. Pt states she used to have a lot of migraines but not as many now since no longer working 3rd shift. Pt states she has changed her eating habits a lot. Pt states she eats popcorn when bored at work; works as Armed forces training and education officer for [employer]. Pt states when the weather warms up she plans to walk 30 minutes during her lunch break. Pt states she drinks a lot of water, at least 64 ounces of fluid a day. Pt states she has eliminated soda mostly; sometimes drinks diet soda.  Pt states she has been struggling with migraines since she was 52 years old. Pt has had a thyroidectomy. Pt states she wants to lose weight. Pt states her usual weight as an adult ha been 220 pounds. Pt states she wants to be 175 pounds stating she has not been this weight since many years ago. Pt states she has tried to lose weight before before: going to a bariatrician not liking the diet plan she was put on. Pt states she avoids processed foods. Pt states she had always worked nights and is now working days. Pt states she will start taking CoQ10, B2, and magnesium again.  Pt states she has a bowel movement every day. Pt states she has trouble sleeping possibly due to the hx of 3rd shift. Pt arrived with her supportive daughter.   To talk about next time: physical activity   MEDICATIONS: See List    DIETARY INTAKE:  Usual eating pattern includes 3 meals and 1-2 snacks per day.  Everyday foods include none stated.  Avoided foods include processed foods.    24-hr recall: eating out for dinner most days of the week B ( AM): premier shake  Snk ( AM): none L ( PM): rice cakes and peanut butter Snk ( PM): 100-calorie popcorn D ( PM): Healthy Start-chicken, peppers, broccoli  Snk ( PM):  Beverages: water with flavoring,  splenda and flavored creamer coffee, juice  Usual physical activity: ADL's  Estimated energy needs: 1600 calories 180 g carbohydrates 120 g protein 44 g fat  Progress Towards Goal(s):  In progress.    Intervention:  Nutrition counseling. Dietitian educated the pt on consuming a well balanced diet. Goals: - Aim for more well-balanced meals.  - Make lunch more of a meal: leftovers from dinner or salad with protein options.  - Aim to increase physical activity with DVD or youtube videos 30 min, 2 days/week on Fri and Sun.    Teaching Method Utilized:  Visual Auditory Hands on  Handouts given during visit include:  none  Barriers to learning/adherence to lifestyle change: none identified   Demonstrated degree of understanding via:  Teach Back   Monitoring/Evaluation:  Dietary intake, exercise,and body weight prn.

## 2017-12-22 ENCOUNTER — Telehealth: Payer: Self-pay

## 2017-12-22 ENCOUNTER — Encounter: Payer: Self-pay | Admitting: Neurology

## 2017-12-22 NOTE — Telephone Encounter (Signed)
Patient has been following all directions - taking the supplements, using Cambria as first line and Imitrx as 2nd, but has had increased headaches for the last week.  Several of them have only been moderate but one kept her bed for the day.  Wants to know if there any alternatives or other solutions.  Please advise.

## 2017-12-23 ENCOUNTER — Ambulatory Visit: Payer: 59 | Admitting: Family Medicine

## 2017-12-23 MED ORDER — ZOLMITRIPTAN 5 MG PO TABS: 5.0000 mg | ORAL_TABLET | ORAL | 0 refills | Status: DC | PRN

## 2017-12-23 MED ORDER — TOPIRAMATE 50 MG PO TABS: 50.0000 mg | ORAL_TABLET | Freq: Every day | ORAL | 3 refills | Status: DC

## 2017-12-23 MED FILL — TOPIRAMATE 50 MG TABLET: 50 | 30 days supply | Qty: 30 | Fill #0

## 2017-12-23 MED FILL — ZOLMitriptan 5 MG TABS: 5 | 30 days supply | Qty: 8 | Fill #0

## 2017-12-23 NOTE — Telephone Encounter (Signed)
Spoke with patient and offered to restart Topamax as a preventative, which she wanted to do.  Instructed her to take Imitrex as a first line but she said it makes her sleepy so she can't take it at work.  I sent Zomig for her to take for abortive therapy at Dr. Georgie Chard suggestion and her request.  Reiterated following sleep hygiene and hydration. Patient will call back if she continues to have problems.

## 2017-12-23 NOTE — Telephone Encounter (Signed)
If she has been taking the supplements, exercising, following sleep hygiene, and hydrating, then I would recommend restarting the topiramate ER 50mg  at bedtime, since it was effective.  For abortive therapy, I would go back to taking the Imitrex shot first.  If it is not effective, then we can try Zomig 5mg  tablet, take at earliest onset of migraine and may repeat once in 2 hours if needed (not to exceed two tablets in 24 hours).  She previously took the Zomig nasal spray, which was effective but she said she would rather not use a nasal spray.

## 2017-12-25 ENCOUNTER — Encounter: Payer: Self-pay | Admitting: Obstetrics & Gynecology

## 2017-12-25 ENCOUNTER — Ambulatory Visit (INDEPENDENT_AMBULATORY_CARE_PROVIDER_SITE_OTHER): Payer: 59 | Admitting: Obstetrics & Gynecology

## 2017-12-25 VITALS — BP 161/101 | HR 75 | Wt 252.6 lb

## 2017-12-25 DIAGNOSIS — N920 Excessive and frequent menstruation with regular cycle: Secondary | ICD-10-CM | POA: Diagnosis not present

## 2017-12-25 DIAGNOSIS — Z01812 Encounter for preprocedural laboratory examination: Secondary | ICD-10-CM

## 2017-12-25 MED ORDER — ALPRAZOLAM ER 1 MG PO TB24: 1.0000 mg | ORAL_TABLET | Freq: Every day | ORAL | 0 refills | Status: DC

## 2017-12-25 MED ORDER — MISOPROSTOL 200 MCG PO TABS: ORAL_TABLET | ORAL | 0 refills | Status: DC

## 2017-12-25 MED FILL — miSOPROStol 200 MCG TABS: 200 | 1 days supply | Qty: 3 | Fill #0

## 2017-12-25 NOTE — Progress Notes (Signed)
   Subjective:    Patient ID: [name], female    DOB: 06-20-1966, 52 y.o.   MRN: [id number]  HPI 52 yo married P2 here to discuss a possible hysterectomy. She has very heavy bleeding, currently ok with megace. A recent ultrasound showed a 21 cm uterus with fibroids.   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 She is unsure about her ovaries at this time. I have sent Jordan an email to schedule this. She will need an EMBX in the near future. I have prescribed cytotec and xanax.

## 2017-12-28 ENCOUNTER — Encounter (HOSPITAL_COMMUNITY): Payer: Self-pay

## 2017-12-29 ENCOUNTER — Ambulatory Visit: Payer: 59 | Admitting: Obstetrics and Gynecology

## 2017-12-29 ENCOUNTER — Encounter: Payer: Self-pay | Admitting: Obstetrics and Gynecology

## 2017-12-29 VITALS — BP 135/95 | HR 72 | Wt 246.5 lb

## 2017-12-29 DIAGNOSIS — R109 Unspecified abdominal pain: Secondary | ICD-10-CM

## 2017-12-29 MED ORDER — KETOROLAC TROMETHAMINE 10 MG PO TABS: 10.0000 mg | ORAL_TABLET | Freq: Four times a day (QID) | ORAL | 0 refills | Status: DC | PRN

## 2017-12-29 MED FILL — KETOROLAC 10 MG TABLET: 10 | 5 days supply | Qty: 20 | Fill #0

## 2017-12-29 NOTE — Progress Notes (Signed)
LOST RX FOR XANAX  FEELS FIBROIDS ARE MOVING AROUND

## 2017-12-29 NOTE — Progress Notes (Signed)
Obstetrics and Gynecology Visit Established Patient Evaluation  Appointment Date: 12/29/2017  Primary Care Provider: Marletta Lor   Chief Complaint: abdominal pain  History of Present Illness:  [name] is a 52 y.o. with abdominal pain since Sunday. No prior history, no radiation. Pt unsure of characteristics and states that "it just hurts", pain all the time. no diarrhea, constipation, dysuria, hematuria, VB. Pt believes it's due to her fibroids moving.   Review of Systems: as noted in the History of Present Illness.   Medications and Allergies: reviewed Physical Exam:  BP (!) 135/95   Pulse 72   Wt 246 lb 8 oz (111.8 kg)   BMI 41.02 kg/m  Body mass index is 41.02 kg/m. General appearance: Well nourished, well developed female in no acute distress.  CV: normal s1 and s2, no MRGs, Pulm: CTAB Abdomen: obese, nd, +BS. Mildly ttp on light touch on entire right side of belly from ribs to suprapubic area. No peritoneal s/s Neuro/Psych:  Normal mood and affect.     Assessment: pt stable  Plan:  1. Abdominal pain, unspecified abdominal location I told her I don't believe it's due to her fibroids. Will do trial of toradol po.   RTC: has f/u with Dr. Hulan Fray already schedule.   Durene Romans MD Attending Center for [employer] farm manager)

## 2017-12-30 LAB — URINE CULTURE: Organism ID, Bacteria: NO GROWTH

## 2018-01-08 MED FILL — LEVOTHYROXINE 137 MCG TABLE: 137 | 30 days supply | Qty: 30 | Fill #3

## 2018-01-14 NOTE — Patient Instructions (Addendum)
Your procedure is scheduled on: Tuesday January 26, 2018 at 3:30 pm  Enter through the Main Entrance of [facility] at: 2:00 pm  Pick up the phone at the desk and dial [phone].  Call this number if you have problems the morning of surgery: [phone].  Remember: Do NOT eat food: after Midnight on Monday April 8 Do NOT drink clear liquids after: 9:30 am Take these medicines the morning of surgery with a SIP OF WATER: Synthroid  Do not take Atenolol-chlorthalidone day of surgery  Do NOT wear jewelry (body piercing), metal hair clips/bobby pins, make-up, or nail polish. Do NOT wear lotions, powders, or perfumes.  You may wear deoderant. Do NOT shave for 48 hours prior to surgery. Do NOT bring valuables to the hospital. Contacts, dentures, or bridgework may not be worn into surgery. Leave suitcase in car.  After surgery it may be brought to your room.   For patients admitted to the hospital, checkout time is 11:00 AM the day of discharge.

## 2018-01-15 ENCOUNTER — Other Ambulatory Visit: Payer: Self-pay

## 2018-01-15 ENCOUNTER — Encounter (HOSPITAL_COMMUNITY)
Admission: RE | Admit: 2018-01-15 | Discharge: 2018-01-15 | Disposition: A | Discharge status: Home / Self Care | Payer: 59 | Source: Ambulatory Visit | Attending: Obstetrics & Gynecology | Admitting: Obstetrics & Gynecology

## 2018-01-15 ENCOUNTER — Encounter (HOSPITAL_COMMUNITY): Payer: Self-pay

## 2018-01-15 DIAGNOSIS — Z01812 Encounter for preprocedural laboratory examination: Secondary | ICD-10-CM | POA: Diagnosis not present

## 2018-01-15 HISTORY — DX: Hypothyroidism, unspecified: E03.9

## 2018-01-15 HISTORY — DX: Other specified postprocedural states: R11.2

## 2018-01-15 HISTORY — DX: Other specified postprocedural states: Z98.890

## 2018-01-15 LAB — CBC
HEMATOCRIT: 40 % (ref 36.0–46.0)
Hemoglobin: 13.7 g/dL (ref 12.0–15.0)
MCH: 26.3 pg (ref 26.0–34.0)
MCHC: 34.3 g/dL (ref 30.0–36.0)
MCV: 76.8 fL — ABNORMAL LOW (ref 78.0–100.0)
Platelets: 367 10*3/uL (ref 150–400)
RBC: 5.21 MIL/uL — ABNORMAL HIGH (ref 3.87–5.11)
RDW: 15.5 % (ref 11.5–15.5)
WBC: 6.7 10*3/uL (ref 4.0–10.5)

## 2018-01-15 LAB — BASIC METABOLIC PANEL
Anion gap: 10 (ref 5–15)
BUN: 21 mg/dL — AB (ref 6–20)
CHLORIDE: 102 mmol/L (ref 101–111)
CO2: 24 mmol/L (ref 22–32)
CREATININE: 1 mg/dL (ref 0.44–1.00)
Calcium: 9.6 mg/dL (ref 8.9–10.3)
GFR calc Af Amer: >60 mL/min (ref >60)
GLUCOSE: 98 mg/dL (ref 65–99)
POTASSIUM: 3.2 mmol/L — AB (ref 3.5–5.1)
Sodium: 136 mmol/L (ref 135–145)

## 2018-01-15 LAB — ABO/RH: ABO/RH(D): O POS

## 2018-01-15 LAB — TYPE AND SCREEN
ABO/RH(D): O POS
Antibody Screen: NEGATIVE

## 2018-01-15 MED FILL — ALPRAZolam ER 1 MG TB24: 1 | 1 days supply | Qty: 1 | Fill #0

## 2018-01-15 NOTE — Pre-Procedure Instructions (Signed)
Dr. Marcell Barlow viewed and okay' d ekg

## 2018-01-22 ENCOUNTER — Ambulatory Visit: Payer: 59 | Admitting: Endocrinology

## 2018-01-22 ENCOUNTER — Ambulatory Visit (INDEPENDENT_AMBULATORY_CARE_PROVIDER_SITE_OTHER): Payer: 59 | Admitting: Obstetrics & Gynecology

## 2018-01-22 ENCOUNTER — Encounter: Payer: Self-pay | Admitting: Obstetrics & Gynecology

## 2018-01-22 VITALS — BP 131/83 | HR 72 | Wt 255.0 lb

## 2018-01-22 DIAGNOSIS — N920 Excessive and frequent menstruation with regular cycle: Secondary | ICD-10-CM

## 2018-01-22 MED ORDER — OXYCODONE-ACETAMINOPHEN 5-325 MG PO TABS: 1.0000 | ORAL_TABLET | Freq: Four times a day (QID) | ORAL | 0 refills | Status: DC | PRN

## 2018-01-22 MED FILL — OXYCODONE-ACETAMINOPHEN 5-3: 5-325 | 5 days supply | Qty: 20 | Fill #0

## 2018-01-22 NOTE — Progress Notes (Signed)
urin

## 2018-01-22 NOTE — Progress Notes (Signed)
   Subjective:    Patient ID: [name], female    DOB: [birth year], 52 y.o.   MRN: [id number]  HPI  52 yo married P2 here for [facility]. She has very heavy periods and very large fibroids. Her endometrium was not seen on u/s due to the fibroids. Her TAH/BSO is scheduled for 01-26-18.  Review of Systems     Objective:   Physical Exam  Breathing, conversing, and ambulating normally Well nourished, well hydrated Black female, no apparent distress Her cervix is so anteverted and so high up in her vagina that I am unable to reach it with a speculum, could barely reach it with a finger     Assessment & Plan:  Pain and unable to sleep- percocet #20 Since I was unable to do a EMBX, I have rec'd a d&c prior to surgery. However, this cannot happen due to scheduling. She understands that there is a very small risk of finding uterine cancer on pathology. So I have agreed to go ahead and do the TAH/BSO without uterine sampling.

## 2018-01-25 ENCOUNTER — Encounter (HOSPITAL_COMMUNITY): Payer: Self-pay

## 2018-01-25 ENCOUNTER — Other Ambulatory Visit: Payer: Self-pay | Admitting: *Deleted

## 2018-01-25 NOTE — Patient Outreach (Addendum)
[facility]) Care Management  01/25/2018  [name] 1966-09-07 [id number]   Subjective: Telephone call to patient's home  / mobile number, no answer, left HIPAA compliant voicemail message, and requested call back.    Objective: Per KPN (Knowledge Performance Now, point of care tool) and chart review, patient to be admitted on 01/26/18 for HYSTERECTOMY ABDOMINAL WITH SALPINGO-OOPHORECTOMY at [facility] hospital.   Patient also has a history of fibroids, hypertension, goiter, migraines, and vitamin D deficiency.       Assessment: Received [facility]MR Preoperative / Transition of care referral on 01/22/18.   Preoperative call follow up pending patient contact.     Plan: RNCM will call patient for 2nd telephone outreach attempt, preoperative call follow up, within 10 business days if no return call.    Evelette Hollern H. Annia Friendly, BSN, [place] Management [facility] Telephonic CM Phone: [phone] Fax: [phone]

## 2018-01-26 ENCOUNTER — Other Ambulatory Visit: Payer: Self-pay | Admitting: *Deleted

## 2018-01-26 ENCOUNTER — Inpatient Hospital Stay (HOSPITAL_COMMUNITY): Payer: 59 | Admitting: Certified Registered Nurse Anesthetist

## 2018-01-26 ENCOUNTER — Other Ambulatory Visit: Payer: Self-pay

## 2018-01-26 ENCOUNTER — Encounter (HOSPITAL_COMMUNITY): Payer: Self-pay | Admitting: Emergency Medicine

## 2018-01-26 ENCOUNTER — Inpatient Hospital Stay (HOSPITAL_COMMUNITY)
Admission: AD | Admit: 2018-01-26 | Discharge: 2018-01-28 | DRG: 742 | Disposition: A | Discharge status: Home / Self Care | Payer: 59 | Source: Ambulatory Visit | Attending: Obstetrics & Gynecology | Admitting: Obstetrics & Gynecology

## 2018-01-26 ENCOUNTER — Encounter (HOSPITAL_COMMUNITY)
Admission: AD | Disposition: A | Discharge status: Home / Self Care | Payer: Self-pay | Source: Ambulatory Visit | Attending: Obstetrics & Gynecology

## 2018-01-26 DIAGNOSIS — Z9889 Other specified postprocedural states: Secondary | ICD-10-CM

## 2018-01-26 DIAGNOSIS — Z6841 Body Mass Index (BMI) 40.0 and over, adult: Secondary | ICD-10-CM

## 2018-01-26 DIAGNOSIS — N939 Abnormal uterine and vaginal bleeding, unspecified: Secondary | ICD-10-CM | POA: Diagnosis not present

## 2018-01-26 DIAGNOSIS — D259 Leiomyoma of uterus, unspecified: Secondary | ICD-10-CM | POA: Diagnosis not present

## 2018-01-26 DIAGNOSIS — K660 Peritoneal adhesions (postprocedural) (postinfection): Secondary | ICD-10-CM | POA: Diagnosis not present

## 2018-01-26 DIAGNOSIS — M791 Myalgia, unspecified site: Secondary | ICD-10-CM | POA: Diagnosis not present

## 2018-01-26 DIAGNOSIS — I10 Essential (primary) hypertension: Secondary | ICD-10-CM | POA: Diagnosis present

## 2018-01-26 DIAGNOSIS — G43909 Migraine, unspecified, not intractable, without status migrainosus: Secondary | ICD-10-CM | POA: Diagnosis not present

## 2018-01-26 DIAGNOSIS — N938 Other specified abnormal uterine and vaginal bleeding: Secondary | ICD-10-CM | POA: Diagnosis not present

## 2018-01-26 DIAGNOSIS — E559 Vitamin D deficiency, unspecified: Secondary | ICD-10-CM | POA: Diagnosis not present

## 2018-01-26 DIAGNOSIS — Q778 Other osteochondrodysplasia with defects of growth of tubular bones and spine: Secondary | ICD-10-CM | POA: Diagnosis not present

## 2018-01-26 HISTORY — PX: HYSTERECTOMY ABDOMINAL WITH SALPINGO-OOPHORECTOMY: SHX6792

## 2018-01-26 HISTORY — PX: LYSIS OF ADHESION: SHX5961

## 2018-01-26 LAB — PREGNANCY, URINE: Preg Test, Ur: NEGATIVE

## 2018-01-26 SURGERY — HYSTERECTOMY, ABDOMINAL, WITH SALPINGO-OOPHORECTOMY
Anesthesia: General | Site: Abdomen

## 2018-01-26 MED ORDER — FENTANYL CITRATE (PF) 250 MCG/5ML IJ SOLN
INTRAMUSCULAR | Status: AC
  Filled 2018-01-26: qty 5

## 2018-01-26 MED ORDER — ROCURONIUM BROMIDE 100 MG/10ML IV SOLN
INTRAVENOUS | Status: DC | PRN
  Administered 2018-01-26: 40 mg via INTRAVENOUS
  Administered 2018-01-26: 10 mg via INTRAVENOUS

## 2018-01-26 MED ORDER — MIDAZOLAM HCL 2 MG/2ML IJ SOLN
INTRAMUSCULAR | Status: AC
  Filled 2018-01-26: qty 2

## 2018-01-26 MED ORDER — DICLOFENAC SODIUM 75 MG PO TBEC
75.0000 mg | DELAYED_RELEASE_TABLET | Freq: Two times a day (BID) | ORAL | Status: DC | PRN
  Filled 2018-01-26: qty 1

## 2018-01-26 MED ORDER — FENTANYL CITRATE (PF) 100 MCG/2ML IJ SOLN
INTRAMUSCULAR | Status: DC | PRN
  Administered 2018-01-26: 100 ug via INTRAVENOUS
  Administered 2018-01-26 (×3): 50 ug via INTRAVENOUS

## 2018-01-26 MED ORDER — MIDAZOLAM HCL 2 MG/2ML IJ SOLN
INTRAMUSCULAR | Status: DC | PRN
  Administered 2018-01-26: 2 mg via INTRAVENOUS

## 2018-01-26 MED ORDER — FENTANYL CITRATE (PF) 100 MCG/2ML IJ SOLN
INTRAMUSCULAR | Status: AC
  Administered 2018-01-26: 50 ug via INTRAVENOUS
  Filled 2018-01-26: qty 2

## 2018-01-26 MED ORDER — KETOROLAC TROMETHAMINE 30 MG/ML IJ SOLN
INTRAMUSCULAR | Status: AC
  Filled 2018-01-26: qty 1

## 2018-01-26 MED ORDER — SCOPOLAMINE 1 MG/3DAYS TD PT72
1.0000 | MEDICATED_PATCH | Freq: Once | TRANSDERMAL | Status: DC
  Administered 2018-01-26: 1.5 mg via TRANSDERMAL

## 2018-01-26 MED ORDER — LIDOCAINE HCL (PF) 1 % IJ SOLN
INTRAMUSCULAR | Status: AC
  Filled 2018-01-26: qty 5

## 2018-01-26 MED ORDER — SUGAMMADEX SODIUM 200 MG/2ML IV SOLN
INTRAVENOUS | Status: AC
  Filled 2018-01-26: qty 2

## 2018-01-26 MED ORDER — DEXAMETHASONE SODIUM PHOSPHATE 10 MG/ML IJ SOLN
INTRAMUSCULAR | Status: DC | PRN
  Administered 2018-01-26: 4 mg via INTRAVENOUS

## 2018-01-26 MED ORDER — HYDROMORPHONE HCL 1 MG/ML IJ SOLN
INTRAMUSCULAR | Status: AC
  Filled 2018-01-26: qty 1

## 2018-01-26 MED ORDER — SUGAMMADEX SODIUM 200 MG/2ML IV SOLN
INTRAVENOUS | Status: DC | PRN
  Administered 2018-01-26: 200 mg via INTRAVENOUS

## 2018-01-26 MED ORDER — OXYCODONE-ACETAMINOPHEN 5-325 MG PO TABS
1.0000 | ORAL_TABLET | ORAL | Status: DC | PRN
  Administered 2018-01-27 – 2018-01-28 (×5): 1 via ORAL
  Filled 2018-01-26 (×5): qty 1

## 2018-01-26 MED ORDER — DEXAMETHASONE SODIUM PHOSPHATE 4 MG/ML IJ SOLN
INTRAMUSCULAR | Status: AC
  Filled 2018-01-26: qty 1

## 2018-01-26 MED ORDER — TOPIRAMATE 25 MG PO TABS
50.0000 mg | ORAL_TABLET | Freq: Every day | ORAL | Status: DC
  Administered 2018-01-26: 50 mg via ORAL
  Filled 2018-01-26 (×4): qty 2

## 2018-01-26 MED ORDER — PROPOFOL 10 MG/ML IV BOLUS
INTRAVENOUS | Status: DC | PRN
  Administered 2018-01-26: 150 mg via INTRAVENOUS

## 2018-01-26 MED ORDER — ONDANSETRON HCL 4 MG/2ML IJ SOLN
INTRAMUSCULAR | Status: DC | PRN
  Administered 2018-01-26: 4 mg via INTRAVENOUS

## 2018-01-26 MED ORDER — MORPHINE SULFATE (PF) 4 MG/ML IV SOLN
2.0000 mg | INTRAVENOUS | Status: DC | PRN
  Administered 2018-01-26 – 2018-01-27 (×3): 2 mg via INTRAVENOUS
  Filled 2018-01-26 (×3): qty 1

## 2018-01-26 MED ORDER — SCOPOLAMINE 1 MG/3DAYS TD PT72
MEDICATED_PATCH | TRANSDERMAL | Status: AC
  Administered 2018-01-26: 1.5 mg via TRANSDERMAL
  Filled 2018-01-26: qty 1

## 2018-01-26 MED ORDER — FENTANYL CITRATE (PF) 100 MCG/2ML IJ SOLN: 25.0000 ug | INTRAMUSCULAR | Status: DC | PRN

## 2018-01-26 MED ORDER — BUPIVACAINE HCL (PF) 0.5 % IJ SOLN
INTRAMUSCULAR | Status: DC | PRN
  Administered 2018-01-26: 30 mL

## 2018-01-26 MED ORDER — ACETAMINOPHEN 160 MG/5ML PO SOLN
ORAL | Status: AC
  Administered 2018-01-26: 975 mg
  Filled 2018-01-26: qty 40.6

## 2018-01-26 MED ORDER — GLYCOPYRROLATE 0.2 MG/ML IJ SOLN
INTRAMUSCULAR | Status: AC
  Filled 2018-01-26: qty 1

## 2018-01-26 MED ORDER — FENTANYL CITRATE (PF) 100 MCG/2ML IJ SOLN
25.0000 ug | INTRAMUSCULAR | Status: DC | PRN
  Administered 2018-01-26 (×3): 50 ug via INTRAVENOUS

## 2018-01-26 MED ORDER — HYDROMORPHONE HCL 1 MG/ML IJ SOLN
INTRAMUSCULAR | Status: DC | PRN
  Administered 2018-01-26 (×2): 0.5 mg via INTRAVENOUS

## 2018-01-26 MED ORDER — HYDROMORPHONE HCL 1 MG/ML IJ SOLN
0.2000 mg | INTRAMUSCULAR | Status: DC | PRN
  Administered 2018-01-26 – 2018-01-27 (×4): 0.6 mg via INTRAVENOUS
  Filled 2018-01-26 (×4): qty 1

## 2018-01-26 MED ORDER — ONDANSETRON HCL 4 MG/2ML IJ SOLN: 4.0000 mg | Freq: Four times a day (QID) | INTRAMUSCULAR | Status: DC | PRN

## 2018-01-26 MED ORDER — DEXTROSE IN LACTATED RINGERS 5 % IV SOLN
INTRAVENOUS | Status: DC
  Administered 2018-01-26 (×2): via INTRAVENOUS

## 2018-01-26 MED ORDER — GLYCOPYRROLATE 0.2 MG/ML IJ SOLN
INTRAMUSCULAR | Status: DC | PRN
  Administered 2018-01-26: 0.2 mg via INTRAVENOUS

## 2018-01-26 MED ORDER — IBUPROFEN 800 MG PO TABS
800.0000 mg | ORAL_TABLET | Freq: Three times a day (TID) | ORAL | Status: DC | PRN
  Administered 2018-01-27 – 2018-01-28 (×3): 800 mg via ORAL
  Filled 2018-01-26 (×4): qty 1

## 2018-01-26 MED ORDER — LACTATED RINGERS IV SOLN
INTRAVENOUS | Status: DC
  Administered 2018-01-26 (×2): via INTRAVENOUS

## 2018-01-26 MED ORDER — CEFAZOLIN SODIUM-DEXTROSE 2-4 GM/100ML-% IV SOLN
2.0000 g | INTRAVENOUS | Status: AC
  Administered 2018-01-26: 2 g via INTRAVENOUS

## 2018-01-26 MED ORDER — ONDANSETRON HCL 4 MG PO TABS: 4.0000 mg | ORAL_TABLET | Freq: Four times a day (QID) | ORAL | Status: DC | PRN

## 2018-01-26 MED ORDER — PROPOFOL 10 MG/ML IV BOLUS
INTRAVENOUS | Status: AC
  Filled 2018-01-26: qty 20

## 2018-01-26 MED ORDER — LEVOTHYROXINE SODIUM 137 MCG PO TABS
137.0000 ug | ORAL_TABLET | Freq: Every day | ORAL | Status: DC
  Administered 2018-01-27 – 2018-01-28 (×2): 137 ug via ORAL
  Filled 2018-01-26 (×3): qty 1

## 2018-01-26 MED ORDER — CHLORTHALIDONE 25 MG PO TABS
25.0000 mg | ORAL_TABLET | Freq: Every day | ORAL | Status: DC
  Filled 2018-01-26 (×3): qty 1

## 2018-01-26 MED ORDER — ATENOLOL 50 MG PO TABS
50.0000 mg | ORAL_TABLET | Freq: Every day | ORAL | Status: DC
  Administered 2018-01-27 – 2018-01-28 (×2): 50 mg via ORAL
  Filled 2018-01-26 (×4): qty 1

## 2018-01-26 MED ORDER — FENTANYL CITRATE (PF) 100 MCG/2ML IJ SOLN
INTRAMUSCULAR | Status: AC
  Filled 2018-01-26: qty 2

## 2018-01-26 MED ORDER — ATENOLOL-CHLORTHALIDONE 50-25 MG PO TABS: 1.0000 | ORAL_TABLET | Freq: Every day | ORAL | Status: DC

## 2018-01-26 MED ORDER — LIDOCAINE HCL (CARDIAC) 20 MG/ML IV SOLN
INTRAVENOUS | Status: DC | PRN
  Administered 2018-01-26: 50 mg via INTRAVENOUS

## 2018-01-26 MED ORDER — ONDANSETRON HCL 4 MG/2ML IJ SOLN
INTRAMUSCULAR | Status: AC
Start: 2018-01-26 — End: 2018-01-26
  Filled 2018-01-26: qty 2

## 2018-01-26 MED ORDER — SUMATRIPTAN SUCCINATE 100 MG PO TABS
100.0000 mg | ORAL_TABLET | ORAL | Status: DC | PRN
  Filled 2018-01-26: qty 1

## 2018-01-26 SURGICAL SUPPLY — 35 items
CANISTER SUCT 3000ML PPV (MISCELLANEOUS) ×2 IMPLANT
CONT PATH 16OZ SNAP LID 3702 (MISCELLANEOUS) ×2 IMPLANT
DECANTER SPIKE VIAL GLASS SM (MISCELLANEOUS) ×1 IMPLANT
DRAPE CESAREAN BIRTH W POUCH (DRAPES) ×2 IMPLANT
DRAPE WARM FLUID 44X44 (DRAPE) IMPLANT
DRSG OPSITE POSTOP 4X10 (GAUZE/BANDAGES/DRESSINGS) ×2 IMPLANT
DRSG TELFA 3X8 NADH (GAUZE/BANDAGES/DRESSINGS) ×2 IMPLANT
DURAPREP 26ML APPLICATOR (WOUND CARE) ×2 IMPLANT
GAUZE SPONGE 4X4 12PLY STRL LF (GAUZE/BANDAGES/DRESSINGS) ×1 IMPLANT
GAUZE SPONGE 4X4 16PLY XRAY LF (GAUZE/BANDAGES/DRESSINGS) ×2 IMPLANT
GLOVE BIO SURGEON STRL SZ 6.5 (GLOVE) ×2 IMPLANT
GLOVE BIOGEL PI IND STRL 7.0 (GLOVE) ×2 IMPLANT
GLOVE BIOGEL PI INDICATOR 7.0 (GLOVE) ×2
GOWN STRL REUS W/TWL LRG LVL3 (GOWN DISPOSABLE) ×6 IMPLANT
HEMOSTAT ARISTA ABSORB 3G PWDR (MISCELLANEOUS) ×1 IMPLANT
NDL SPNL 18GX3.5 QUINCKE PK (NEEDLE) ×1 IMPLANT
NEEDLE SPNL 18GX3.5 QUINCKE PK (NEEDLE) ×2 IMPLANT
NS IRRIG 1000ML POUR BTL (IV SOLUTION) ×2 IMPLANT
PACK ABDOMINAL GYN (CUSTOM PROCEDURE TRAY) ×2 IMPLANT
PAD ABD 8X7 1/2 STERILE (GAUZE/BANDAGES/DRESSINGS) ×1 IMPLANT
PAD DRESSING TELFA 3X8 NADH (GAUZE/BANDAGES/DRESSINGS) IMPLANT
PAD OB MATERNITY 4.3X12.25 (PERSONAL CARE ITEMS) ×2 IMPLANT
PROTECTOR NERVE ULNAR (MISCELLANEOUS) ×2 IMPLANT
SPONGE LAP 18X18 X RAY DECT (DISPOSABLE) ×4 IMPLANT
STRIP CLOSURE SKIN 1/2X4 (GAUZE/BANDAGES/DRESSINGS) ×2 IMPLANT
SUT CHROMIC 3 0 SH 27 (SUTURE) IMPLANT
SUT PDS AB 0 CTX 60 (SUTURE) IMPLANT
SUT VIC AB 0 CT1 36 (SUTURE) ×4 IMPLANT
SUT VIC AB 2-0 CT1 18 (SUTURE) ×6 IMPLANT
SUT VIC AB 3-0 CT1 27 (SUTURE) ×2
SUT VIC AB 3-0 CT1 TAPERPNT 27 (SUTURE) ×1 IMPLANT
SYR 30ML LL (SYRINGE) ×2 IMPLANT
TAPE HYPAFIX 4 X10 (GAUZE/BANDAGES/DRESSINGS) ×1 IMPLANT
TOWEL OR 17X24 6PK STRL BLUE (TOWEL DISPOSABLE) ×4 IMPLANT
TRAY FOLEY CATH SILVER 14FR (SET/KITS/TRAYS/PACK) ×2 IMPLANT

## 2018-01-26 NOTE — Anesthesia Preprocedure Evaluation (Addendum)
Anesthesia Evaluation  Patient identified by MRN, date of birth, ID band Patient awake    Reviewed: Allergy & Precautions, H&P , Patient's Chart, lab work & pertinent test results  Airway Mallampati: II  TM Distance: >3 FB Neck ROM: full    Dental no notable dental hx.    Pulmonary    Pulmonary exam normal breath sounds clear to auscultation       Cardiovascular Exercise Tolerance: Good hypertension,  Rhythm:regular Rate:Normal     Neuro/Psych    GI/Hepatic   Endo/Other  Morbid obesity  Renal/GU      Musculoskeletal   Abdominal   Peds  Hematology   Anesthesia Other Findings   Reproductive/Obstetrics                            Anesthesia Physical Anesthesia Plan  ASA: III  Anesthesia Plan: General   Post-op Pain Management:    Induction: Intravenous  PONV Risk Score and Plan: 2 and Dexamethasone, Ondansetron, Scopolamine patch - Pre-op and Treatment may vary due to age or medical condition  Airway Management Planned: Oral ETT  Additional Equipment:   Intra-op Plan:   Post-operative Plan: Extubation in OR  Informed Consent: I have reviewed the patients History and Physical, chart, labs and discussed the procedure including the risks, benefits and alternatives for the proposed anesthesia with the patient or authorized representative who has indicated his/her understanding and acceptance.     Plan Discussed with:   Anesthesia Plan Comments: (  )       Anesthesia Quick Evaluation

## 2018-01-26 NOTE — Transfer of Care (Signed)
Immediate Anesthesia Transfer of Care Note  Patient: [name]  Procedure(s) Performed: SUPRACERVICAL HYSTERECTOMY ABDOMINAL WITH SALPINGO-OOPHORECTOMY (N/A Abdomen) EXTENSIVE LYSIS OF ADHESIONS (N/A Abdomen)  Patient Location: PACU  Anesthesia Type:General  Level of Consciousness: awake, alert  and oriented  Airway & Oxygen Therapy: Patient Spontanous Breathing and Patient connected to nasal cannula oxygen  Post-op Assessment: Report given to RN and Post -op Vital signs reviewed and stable  Post vital signs: Reviewed and stable  Last Vitals:  Vitals Value Taken Time  BP 153/90 01/26/2018  2:47 PM  Temp    Pulse 88 01/26/2018  2:51 PM  Resp 15 01/26/2018  2:51 PM  SpO2 97 % 01/26/2018  2:51 PM  Vitals shown include unvalidated device data.  Last Pain:  Vitals:   01/26/18 1202  TempSrc: Oral  PainSc: 3       Patients Stated Pain Goal: 3 (76/22/63 3354)  Complications: No apparent anesthesia complications

## 2018-01-26 NOTE — Op Note (Signed)
01/26/2018  2:36 PM  PATIENT:  [name]  52 y.o. female  PRE-OPERATIVE DIAGNOSIS:  Fibroids, DUB, morbid obesity DUB  POST-OPERATIVE DIAGNOSIS:  Same + dense bladder and bowel adhesions  PROCEDURE:  Procedure(s): SUPRACERVICAL HYSTERECTOMY ABDOMINAL WITH SALPINGO-OOPHORECTOMY (N/A) EXTENSIVE LYSIS OF ADHESIONS (N/A)  SURGEON:  Surgeon(s) and Role:    * Hulan Fray, Wilhemina Cash, MD - Primary    * Sloan Leiter, MD - Assisting   ANESTHESIA:   local and spinal  EBL:  650 mL   BLOOD ADMINISTERED:none  DRAINS: none   LOCAL MEDICATIONS USED:  MARCAINE     SPECIMEN:  Source of Specimen:  uterus, tubes, ovaries (no cervix)  DISPOSITION OF SPECIMEN:  PATHOLOGY  COUNTS:  YES  TOURNIQUET:  * No tourniquets in log *  DICTATION: .Dragon Dictation  PLAN OF CARE: Admit to inpatient   PATIENT DISPOSITION:  PACU - hemodynamically stable.   Delay start of Pharmacological VTE agent (>24hrs) due to surgical blood loss or risk of bleeding: not applicable     The risks, benefits, and alternatives of surgery were explained, understood, and accepted. Consents were signed. All questions were answered. She was taken to the operating room and general anesthesia was applied without complication. Her abdomen and vagina were prepped and draped in the usual sterile fashion. A Foley catheter was placed which drained clear urine throughout the case. A transverse incision was made approximately 2 cm above her symphysis pubis after injecting 30 mL of 0.5% marcaine in the subcutaneous tissue. This was at the same site as 2 previous incisons. The incision was carried down through the subcutaneous tissue to the fascia. Bleeding encountered was cauterized with the Bovie. The fascia was scored the midline and the fascial incision was extended bilaterally. There was much scarring in this area.  The pyramidalis muscles were separated in a transverse fashion using electrosurgical technique. Approximately 2 cm of the  rectus muscles were separated in a transverse fashion in the midline using electrosurgical technique. Hemostasis was maintained. The peritoneum was entered with hemostats and the peritoneal incision was extended bilaterally with the Bovie, taking care to avoid bowel and bladder.T The patient was placed in Trendelenburg position and her bowel was packed out of the abdominal cavity. The pelvis was inspected. Her very large uterus filled the entire pelvis. I used towel clamps to elevate the uterus out of the incision. Coker clamps were used to elevate the uterus. The bladder was densely adherent to the uterus, up to the round ligaments on both sides. I carefully released these adhesions, taking care to avoid hurting the bladder. The bowel was adherent to the left adnexa. I carefully removed these adhesions. The round ligaments were identified clamped cut and ligated. A bladder flap was created anteriorly and the bladder was pushed out of the operative site with a moist lap sponge. The uteroovarian ligaments were identified bilaterally. They were clamped, cut, and ligated. Excellent hemostasis was noted. 2-0 Vicryl sutures used throughout this case unless otherwise specified. The uterine vessels were skeletonized, clamped, cut, and ligated. Because of the dense adhesions of the bladder and the length of her cervix (at leaset 6 cm long), I decided to do a supracervical hysterectomy. The uterus was amputated. The cervical stump was hemostatic. I oversewed the stump with 0 vicryl suture in a running, locking fashion. All pedicles were hemostatic.I placed Arista over the cervical stump  The ureters were noted to be functioning and of normal caliber. The sponges were removed from the  pelvis. The rectus muscles were inspected and hemostasis was assured. The fascia was closed with a #1 PDS loop in a running nonlocking suture. The subcutaneous tissue was irrigated, clean, dry.  A subcuticular closure was done with 3-0 vicryl  suture. She tolerated the procedure well and was taken to the recovery room in stable condition. Her Foley catheter drained clear urine throughout.

## 2018-01-26 NOTE — Anesthesia Procedure Notes (Signed)
Procedure Name: Intubation Date/Time: 01/26/2018 1:18 PM Performed by: Bufford Spikes, CRNA Pre-anesthesia Checklist: Patient identified, Emergency Drugs available, Suction available and Patient being monitored Patient Re-evaluated:Patient Re-evaluated prior to induction Oxygen Delivery Method: Circle system utilized Preoxygenation: Pre-oxygenation with 100% oxygen Induction Type: IV induction Ventilation: Mask ventilation without difficulty Laryngoscope Size: Miller and 2 Grade View: Grade II Tube type: Oral Tube size: 7.0 mm Number of attempts: 1 Airway Equipment and Method: Stylet and Oral airway Placement Confirmation: ETT inserted through vocal cords under direct vision,  positive ETCO2 and breath sounds checked- equal and bilateral Secured at: 21 cm Tube secured with: Tape Dental Injury: Teeth and Oropharynx as per pre-operative assessment

## 2018-01-26 NOTE — Progress Notes (Signed)
Patient talked to Rn about migraines and having them for 35 years. She has been having one since this morning and nothing seems to help. She has 0.6 of Dilaudid and 2 mg of Morphine that she has been taking every 2 hours for her incisional pain. She states that it is not curing her migraines and nothing works. I offered to get her some caffeine and see if it helps at all and she agreed to that.

## 2018-01-26 NOTE — Patient Outreach (Signed)
[facility]) Care Management  01/26/2018  [name] September 14, 1966 [id number]  Subjective: Telephone call to patient's home / mobile number, spoke with patient, and HIPAA verified.  Discussed [facility] Care Management UMR Transition of care follow up, preoperative call follow up, patient voiced understanding, and is in agreement to both types of follow up.   Patient states she is doing well, states her surgery is today, scheduled to arrive at hospital by 12:00pm, and estimated length of stay is 2 nights.   Patient states she is able to manage self care and has assistance as needed post discharge with activities of daily living / home management.  Patient voices understanding of medical diagnosis, pending surgery, and treatment plan.  States she is accessing the following [facility] benefits: outpatient pharmacy, hospital indemnity (will need contact number on follow up call), has started family medical leave act (FMLA) process,  is aware that she may need to follow up to verify paperwork completed in a timely fashion, and will also obtain copy of paperwork for her records.  Discussed Advanced Directives, advised of [facility] [employer]ual [employer] Advanced Directives document completion benefit, patient voices understanding, states she already has paperwork to  complete at a later time, and is aware of how to access this benefit in the future if needed.      Objective: Per KPN (Knowledge Performance Now, point of care tool) and chart review, patient to be admitted on 01/26/18 for HYSTERECTOMY ABDOMINAL WITH SALPINGO-OOPHORECTOMY at [facility] hospital.   Patient also has a history of fibroids, hypertension, goiter, migraines, and vitamin D deficiency.       Assessment: Received UMR Preoperative / Transition of care referral on 01/22/18.   Preoperative call completed, and transition of care follow up pending notification of patient discharge.     Plan: RNCM will call patient for  telephone  outreach attempt, transition of care follow up, within 3 business days of hospital discharge notification.      Nissa Stannard H. Annia Friendly, BSN, [place] Management [facility] Telephonic CM Phone: [phone] Fax: [phone]

## 2018-01-26 NOTE — H&P (Signed)
[name] is an 52 y.o. female. Married P2 here for a TAH/BSO. Her uterus is very large, full of fibroids. She has some pain with sex "due to fibroids", able to have an orgasm. She has some hot flashes, has bled lightly for the last 3 weeks straight. I was uanble to do an embx.     Menstrual History: Menarche age:89 Patient's last menstrual period was 01/26/2018.    Past Medical History:  Diagnosis Date  . 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 Fibroids   . Headache(784.0) 06/14/2007   Dr. Tomi Likens seeing pt. for HA management   . High blood pressure   . HYPERTENSION 06/14/2007  . Hypothyroidism 2018   thyroidectomy   . Migraines   . Nodular goiter   . Obesity   . PONV (postoperative nausea and vomiting)   . Vitamin D deficiency     Past Surgical History:  Procedure Laterality Date  . [place]   x2, with epidural & general anesth.   Marland Kitchen DILATION AND CURETTAGE OF UTERUS     x2  . THYROIDECTOMY  11/18/2016  . THYROIDECTOMY N/A 11/18/2016   Procedure: TOTAL THYROIDECTOMY;  Surgeon: Armandina Gemma, MD;  Location: [facility] OR;  Service: General;  Laterality: N/A;    Family History  Problem Relation Age of Onset  . Diabetes Mother   . Hypertension Mother   . Thyroid disease Mother     Social History:  reports that she has never smoked. She has never used smokeless tobacco. She reports that she does not drink alcohol or use drugs.  Allergies:  Allergies  Allergen Reactions  . No Known Allergies     Medications Prior to Admission  Medication Sig Dispense Refill Last Dose  . atenolol-chlorthalidone (TENORETIC) 50-25 MG tablet Take 1 tablet by mouth daily. 90 tablet 3 Past Week at Unknown time  . cholecalciferol (VITAMIN D) 1000 units tablet Take 1,000 Units by mouth daily.   Past Week at Unknown time  . Coenzyme Q10 (COQ10) 100 MG CAPS Take 1 capsule by mouth daily.   Past Week at Unknown time  . diclofenac (VOLTAREN) 75 MG EC tablet Take 1 tablet (75 mg total) by  mouth 2 (two) times daily as needed. For cramps 60 tablet 2 Past Week at Unknown time  . Diclofenac Potassium 50 MG PACK Take 50 mg by mouth as directed. 1 each 3 Past Month at Unknown time  . ibuprofen (ADVIL,MOTRIN) 200 MG tablet Take 800 mg by mouth every 8 (eight) hours as needed (for headache).   01/25/2018 at Unknown time  . ketorolac (TORADOL) 10 MG tablet Take 1 tablet (10 mg total) by mouth every 6 (six) hours as needed. (Patient not taking: Reported on 01/22/2018) 20 tablet 0 Not Taking  . levothyroxine (SYNTHROID) 137 MCG tablet Take 1 tablet (137 mcg total) by mouth daily before breakfast. 30 tablet 3 01/25/2018 at Unknown time  . magnesium oxide (MAG-OX) 400 MG tablet Take 400 mg by mouth daily.   Past Week at Unknown time  . megestrol (MEGACE) 40 MG tablet Take two tablets three times daily for 3 days, then two tablets twice daily for 3 days, then one tablet twice daily (Patient taking differently: Take 40 mg by mouth 2 (two) times daily. Take two tablets three times daily for 3 days, then two tablets twice daily for 3 days, then one tablet twice daily) 90 tablet 3 01/25/2018 at Unknown time  . oxyCODONE-acetaminophen (PERCOCET/ROXICET) 5-325 MG tablet Take 1  tablet by mouth every 6 (six) hours as needed. 20 tablet 0 01/25/2018 at Unknown time  . SUMAtriptan (IMITREX) 100 MG tablet TAKE 1 TABLET BY MOUTH EVERY 2 HOURS AS NEEDED FOR MIGRAINE 10 tablet 3 Past Month at Unknown time  . topiramate (TOPAMAX) 50 MG tablet Take 1 tablet (50 mg total) by mouth daily. 30 tablet 3 Past Week at Unknown time  . zolmitriptan (ZOMIG) 5 MG tablet Take 1 tablet (5 mg total) by mouth as needed for migraine. Take at the earliest onset of migraine; make repeat once in 2 hours if needed, not to exceed 2 tablets in 24 hours; do not take more than 2 times a week 10 tablet 0 Past Month at Unknown time  . niacin 500 MG tablet Take 500 mg by mouth at bedtime.   Taking    ROS Works at [employer] Married for  25 years Blood pressure (!) 157/91, pulse (!) 101, temperature 98.1 F (36.7 C), temperature source Oral, resp. rate 16, height 5\' 5"  (1.651 m), weight 115.7 kg (255 lb), last menstrual period 01/26/2018, SpO2 100 %. Physical Exam Breathing, conversing, and ambulating normally Well nourished, well hydrated Black female, no apparent distress Abd- benign, obese, uterus palpable to umbilicus  Results for orders placed or performed during the hospital encounter of 01/26/18 (from the past 24 hour(s))  Pregnancy, urine     Status: None   Collection Time: 01/26/18 11:45 AM  Result Value Ref Range   Preg Test, Ur NEGATIVE NEGATIVE    No results found.  Assessment/Plan: Symptomatic fibroids- plan for TAH/BSO. She understands that she will be menopausal and may have hot flahes, vaginal dryness.  [name] 01/26/2018, 12:39 PM

## 2018-01-27 ENCOUNTER — Encounter (HOSPITAL_COMMUNITY): Payer: Self-pay | Admitting: Obstetrics & Gynecology

## 2018-01-27 DIAGNOSIS — Z0279 Encounter for issue of other medical certificate: Secondary | ICD-10-CM

## 2018-01-27 LAB — CBC
HCT: 30.7 % — ABNORMAL LOW (ref 36.0–46.0)
HEMOGLOBIN: 10.5 g/dL — AB (ref 12.0–15.0)
MCH: 26.1 pg (ref 26.0–34.0)
MCHC: 34.2 g/dL (ref 30.0–36.0)
MCV: 76.4 fL — ABNORMAL LOW (ref 78.0–100.0)
Platelets: 281 10*3/uL (ref 150–400)
RBC: 4.02 MIL/uL (ref 3.87–5.11)
RDW: 15.6 % — ABNORMAL HIGH (ref 11.5–15.5)
WBC: 13.7 10*3/uL — AB (ref 4.0–10.5)

## 2018-01-27 LAB — CREATININE, SERUM: Creatinine, Ser: 0.84 mg/dL (ref 0.44–1.00)

## 2018-01-27 MED ORDER — ENOXAPARIN SODIUM 60 MG/0.6ML ~~LOC~~ SOLN
0.5000 mg/kg | SUBCUTANEOUS | Status: DC
  Administered 2018-01-27: 10:00:00 60 mg via SUBCUTANEOUS
  Filled 2018-01-27 (×3): qty 0.6

## 2018-01-27 NOTE — Anesthesia Postprocedure Evaluation (Signed)
Anesthesia Post Note  Patient: [name]  Procedure(s) Performed: SUPRACERVICAL HYSTERECTOMY ABDOMINAL WITH SALPINGO-OOPHORECTOMY (N/A Abdomen) EXTENSIVE LYSIS OF ADHESIONS (N/A Abdomen)     Patient location during evaluation: PACU Anesthesia Type: General Level of consciousness: awake and alert Pain management: pain level controlled Vital Signs Assessment: post-procedure vital signs reviewed and stable Respiratory status: spontaneous breathing, nonlabored ventilation, respiratory function stable and patient connected to nasal cannula oxygen Cardiovascular status: blood pressure returned to baseline and stable Postop Assessment: no apparent nausea or vomiting Anesthetic complications: no    Last Vitals:  Vitals:   01/26/18 2327 01/27/18 0419  BP: (!) 148/76 (!) 143/55  Pulse: 88 89  Resp: 18 16  Temp: 36.8 C 37.2 C  SpO2: 99% 100%    Last Pain:  Vitals:   01/27/18 0631  TempSrc:   PainSc: 5                  Aidyn Sportsman EDWARD

## 2018-01-27 NOTE — Progress Notes (Signed)
1 Day Post-Op Procedure(s) (LRB): SUPRACERVICAL HYSTERECTOMY ABDOMINAL WITH SALPINGO-OOPHORECTOMY (N/A) EXTENSIVE LYSIS OF ADHESIONS (N/A)  Subjective: Patient reports incisional pain, tolerating PO and no problems voiding.    Objective: I have reviewed patient's vital signs, intake and output, medications and labs.  General: alert Resp: clear to auscultation bilaterally Cardio: regular rate and rhythm, S1, S2 normal, no murmur, click, rub or gallop GI: soft, non-tender; bowel sounds normal; no masses,  no organomegaly  Dressing: clean, dry, intact  Assessment: s/p Procedure(s): SUPRACERVICAL HYSTERECTOMY ABDOMINAL WITH SALPINGO-OOPHORECTOMY (N/A) EXTENSIVE LYSIS OF ADHESIONS (N/A): stable  Plan: Encourage ambulation  Start lovenox for DVT prophylaxis   LOS: 1 day    [name] 01/27/2018, 8:35 AM

## 2018-01-27 NOTE — Progress Notes (Addendum)
Patients peripheral IV was occluded after patient disconnected her fluids and no access could be found. Patient is tolerating fluids and on a regular diet. IV was discontinued.

## 2018-01-28 MED ORDER — OXYCODONE-ACETAMINOPHEN 5-325 MG PO TABS: 1.0000 | ORAL_TABLET | ORAL | 0 refills | Status: DC | PRN

## 2018-01-28 MED FILL — OXYCODONE-ACETAMINOPHEN 5-3: 5-325 | 8 days supply | Qty: 30 | Fill #0

## 2018-01-28 NOTE — Discharge Instructions (Signed)
Abdominal Hysterectomy, Care After °This sheet gives you information about how to care for yourself after your procedure. Your doctor may also give you more specific instructions. If you have problems or questions, contact your doctor. °Follow these instructions at home: °Bathing °· Do not take baths, swim, or use a hot tub until your doctor says it is okay. Ask your doctor if you can take showers. You may only be allowed to take sponge baths for bathing. °· Keep the bandage (dressing) dry until your doctor says it can be taken off. °Surgical cut ( °incision) care °· Follow instructions from your doctor about how to take care of your cut from surgery. Make sure you: °? Wash your hands with soap and water before you change your bandage (dressing). If you cannot use soap and water, use hand sanitizer. °? Change your bandage as told by your doctor. °? Leave stitches (sutures), skin glue, or skin tape (adhesive) strips in place. They may need to stay in place for 2 weeks or longer. If tape strips get loose and curl up, you may trim the loose edges. Do not remove tape strips completely unless your doctor says it is okay. °· Check your surgical cut area every day for signs of infection. Check for: °? Redness, swelling, or pain. °? Fluid or blood. °? Warmth. °? Pus or a bad smell. °Activity °· Do gentle, daily exercise as told by your doctor. You may be told to take short walks every day and go farther each time. °· Do not lift anything that is heavier than 10 lb (4.5 kg), or the limit that your doctor tells you, until he or she says that it is safe. °· Do not drive or use heavy machinery while taking prescription pain medicine. °· Do not drive for 24 hours if you were given a medicine to help you relax (sedative). °· Follow your doctor's advice about exercise, driving, and general activities. Ask your doctor what activities are safe for you. °Lifestyle °· Do not douche, use tampons, or have sex for at least 6 weeks or as  told by your doctor. °· Do not drink alcohol until your doctor says it is okay. °· Drink enough fluid to keep your pee (urine) clear or pale yellow. °· Try to have someone at home with you for the first 1-2 weeks to help. °· Do not use any products that contain nicotine or tobacco, such as cigarettes and e-cigarettes. These can slow down healing. If you need help quitting, ask your doctor. °General instructions °· Take over-the-counter and prescription medicines only as told by your doctor. °· Do not take aspirin or ibuprofen. These medicines can cause bleeding. °· To prevent or treat constipation while you are taking prescription pain medicine, your doctor may suggest that you: °? Drink enough fluid to keep your urine clear or pale yellow. °? Take over-the-counter or prescription medicines. °? Eat foods that are high in fiber, such as: °§ Fresh fruits and vegetables. °§ Whole grains. °§ Beans. °? Limit foods that are high in fat and processed sugars, such as fried and sweet foods. °· Keep all follow-up visits as told by your doctor. This is important. °Contact a doctor if: °· You have chills or fever. °· You have redness, swelling, or pain around your cut. °· You have fluid or blood coming from your cut. °· Your cut feels warm to the touch. °· You have pus or a bad smell coming from your cut. °· Your cut breaks   open. °· You feel dizzy or light-headed. °· You have pain or bleeding when you pee. °· You keep having watery poop (diarrhea). °· You keep feeling sick to your stomach (nauseous) or keep throwing up (vomiting). °· You have unusual fluid (discharge) coming from your vagina. °· You have a rash. °· You have a reaction to your medicine. °· Your pain medicine does not help. °Get help right away if: °· You have a fever and your symptoms get worse all of a sudden. °· You have very bad belly (abdominal) pain. °· You are short of breath. °· You pass out (faint). °· You have pain, swelling, or redness of your  leg. °· You bleed a lot from your vagina and notice clumps of blood (clots). °Summary °· Do not take baths, swim, or use a hot tub until your doctor says it is okay. Ask your doctor if you can take showers. You may only be allowed to take sponge baths for bathing. °· Follow your doctor's advice about exercise, driving, and general activities. Ask your doctor what activities are safe for you. °· Do not lift anything that is heavier than 10 lb (4.5 kg), or the limit that your doctor tells you, until he or she says that it is safe. °· Try to have someone at home with you for the first 1-2 weeks to help. °This information is not intended to replace advice given to you by your health care provider. Make sure you discuss any questions you have with your health care provider. °Document Released: 07/15/2008 Document Revised: 09/24/2016 Document Reviewed: 09/24/2016 °Elsevier Interactive Patient Education © 2017 [employer] Inc[employer] ° °

## 2018-01-28 NOTE — Discharge Summary (Signed)
Physician Discharge Summary  Patient ID: BERKLIE DETHLEFS MRN: [id number] DOB/AGE: 51-Apr-1967 51 y.o.  Admit date: 01/26/2018 Discharge date: 01/28/2018  Admission Diagnoses: symptomatic fibroids  Discharge Diagnoses: same Active Problems:   Post-operative state   Discharged Condition: good  Hospital Course: She underwent an uncomplicated supracervical hysterectomy, bilateral salpingoopherectomy, extensive lysis of adhesions. By POD #1 she was tolerating food and liquids well. She was ambulating, voiding. She voiced her readiness to go home on POD #2. She was given subcutaneous lovenox while in the hospital to help prevent DVTs.   Consults: None  Significant Diagnostic Studies: labs: post op hbg was 10.5  Treatments: surgery: as above  Discharge Exam: Blood pressure 111/69, pulse 69, temperature 99.6 F (37.6 C), temperature source Oral, resp. rate 16, height 5\' 5"  (1.651 m), weight 115.7 kg (255 lb), last menstrual period 01/26/2018, SpO2 98 %. General appearance: alert Cardio: regular rate and rhythm, S1, S2 normal, no murmur, click, rub or gallop GI: soft, non-tender; bowel sounds normal; no masses,  no organomegaly Incision/Wound: clean, dry, intact  Disposition:     Follow-up Information    Cassundra Mckeever C, MD. Schedule an appointment as soon as possible for a visit in 5 weeks.   Specialty:  Obstetrics and Gynecology Contact information: [place] [address] [phone]           Signed: [name] 01/28/2018, 11:53 AM

## 2018-01-29 ENCOUNTER — Other Ambulatory Visit: Payer: Self-pay | Admitting: *Deleted

## 2018-01-29 ENCOUNTER — Ambulatory Visit: Payer: 59 | Admitting: Neurology

## 2018-01-29 NOTE — Patient Outreach (Signed)
[facility]) Care Management  01/29/2018  Amber SMYSER 01-19-1966 [id number]   Subjective: Telephone call to patient's home / mobile number, spoke with patient, and HIPAA verified.  Discussed [facility] Care Management UMR Transition of care follow up, patient voiced understanding, and is in agreement to follow up.   Patient states she is doing well, remembers speaking with this RNCM in the past, surgery went well, and has a follow up appointment with surgeon on 03/06/18.  Patient states she is able to manage self care and has assistance as needed with activities of daily living / home management.  Patient voices understanding of medical diagnosis, surgery, and treatment plan.  [facility] benefits discussed on 01/26/18 preoperative call and patient states no additional questions at this time.    Patient states she does not have any  transition of care, care coordination, disease management, disease monitoring, transportation, community resource, or pharmacy needs at this time.  Patient in agreement to receive the following EMMI educational handouts:  Managing Pain After Surgery and Hysterectomy Discharge Instructions.  States she is very appreciative of the follow up and is in agreement to receive [place] Management information.      Objective:Per KPN (Knowledge Performance Now, point of care tool) and chart review,patient to be admitted on 01/26/18 [facility]Y [facility]. Patient also has a history of fibroids, hypertension, goiter, migraines, and vitamin D deficiency.      Assessment: Received UMR Preoperative / Transition of care referral on 01/22/18.Preoperative call completed, and Transition of care follow up completed, no care management needs, and will proceed with case closure.        Plan:RNCM will send patient successful outreach letter, [facility] pamphlet, and magnet. RNCM will complete case closure due to follow up  completed / no care management needs.       Alyne Martinson H. Annia Friendly, BSN, [place] Management [facility] Telephonic CM Phone: [phone] Fax: [phone]

## 2018-02-03 ENCOUNTER — Encounter: Payer: Self-pay | Admitting: Radiology

## 2018-02-11 ENCOUNTER — Encounter: Payer: Self-pay | Admitting: Endocrinology

## 2018-02-12 ENCOUNTER — Telehealth: Payer: Self-pay | Admitting: *Deleted

## 2018-02-12 NOTE — Telephone Encounter (Signed)
Pt called in stating she has only taken oxycodone bid and is still in pain. She requested another typr of medication called in for pain. Advised pt to start alternating ibuprofen with oxycodone around the clock. She has 6 pills of oxycodone left which should get her through the weekend. She will call back on Monday if pain is no better with alternating ibuprofen with oxycodone.

## 2018-02-19 ENCOUNTER — Other Ambulatory Visit (INDEPENDENT_AMBULATORY_CARE_PROVIDER_SITE_OTHER): Payer: 59

## 2018-02-19 DIAGNOSIS — E890 Postprocedural hypothyroidism: Secondary | ICD-10-CM

## 2018-02-20 LAB — T4, FREE: FREE T4: 0.9 ng/dL (ref 0.60–1.60)

## 2018-02-20 LAB — TSH: TSH: 1.44 u[IU]/mL (ref 0.35–4.50)

## 2018-02-22 ENCOUNTER — Encounter: Payer: Self-pay | Admitting: Endocrinology

## 2018-02-22 ENCOUNTER — Ambulatory Visit: Payer: 59 | Admitting: Endocrinology

## 2018-02-22 VITALS — BP 138/80 | HR 106 | Ht 65.0 in | Wt 249.0 lb

## 2018-02-22 DIAGNOSIS — E890 Postprocedural hypothyroidism: Secondary | ICD-10-CM

## 2018-02-22 MED ORDER — LEVOTHYROXINE SODIUM 137 MCG PO TABS: 137.0000 ug | ORAL_TABLET | Freq: Every day | ORAL | 5 refills | Status: DC

## 2018-02-22 MED FILL — LEVOTHYROXINE 137 MCG TABLE: 137 | 30 days supply | Qty: 30 | Fill #0

## 2018-02-22 NOTE — Progress Notes (Signed)
Patient ID: [name], female   DOB: Oct 09, 1966, 52 y.o.   MRN: [id number]           Reason for Appointment: Follow-up of low thyroid    History of Present Illness:   She had a total THYROIDECTOMY in 11/18/16 done for her multinodular goiter with symptoms of pressure and choking sensation in her neck and some difficulty with swallowing She was seen in follow-up after her surgery and her TSH was 11.7, at that time she was taking 88 g of levothyroxine Also was feeling significantly fatigued  RECENT history: She has been taking 137 g of levothyroxine since 09/2017 Previously with 112 g of levothyroxine her TSH had gone up 11.7 At that time she was not having any specific symptoms  No recent complains of feeling tired and her hair is normal However she is just recovering from her gynecological surgery  She takes her levothyroxine consistently before breakfast with water Since she ran out of her 137 though she was taking 112 for 3 or 4 days prior to her labs  Her TSH is consistently back to normal  Lab Results  Component Value Date   TSH 1.44 02/19/2018   TSH 1.46 11/19/2017   TSH 11.71 (H) 09/22/2017   FREET4 0.90 02/19/2018   FREET4 1.26 11/19/2017   FREET4 0.91 09/22/2017    Previous history: The patient's thyroid enlargement was first discovered in 03/2014 incidentally when the patient was at the urgent care center Her ultrasound showed a multinodular goiter  Since she had an increased size of the isthmus nodule this was evaluated with needle aspiration in 07/2016 and showed benign follicular adenoma  She  had an ultrasound exam in 10/16 showed multiple nodules with dominant solid nodule in the left isthmus measuring 2.8 cm, previously 2.6.    Other nodules were the same or minimally changed   OBESITY:  She tried phentermine previously but apparently increased her blood pressure She says that before her gynecological surgery she was doing better with diet and  exercise and was losing weight   Wt Readings from Last 3 Encounters:  02/22/18 249 lb (112.9 kg)  01/22/18 255 lb (115.7 kg)  12/29/17 246 lb 8 oz (111.8 kg)    Allergies as of 02/22/2018      Reactions   No Known Allergies       Medication List        Accurate as of 02/22/18  2:04 PM. Always use your most recent med list.          atenolol-chlorthalidone 50-25 MG tablet Commonly known as:  TENORETIC Take 1 tablet by mouth daily.   cholecalciferol 1000 units tablet Commonly known as:  VITAMIN D Take 1,000 Units by mouth daily.   CoQ10 100 MG Caps Take 1 capsule by mouth daily.   ibuprofen 200 MG tablet Commonly known as:  ADVIL,MOTRIN Take 800 mg by mouth every 8 (eight) hours as needed (for headache).   levothyroxine 137 MCG tablet Commonly known as:  SYNTHROID Take 1 tablet (137 mcg total) by mouth daily before breakfast.   magnesium oxide 400 MG tablet Commonly known as:  MAG-OX Take 400 mg by mouth daily.   niacin 500 MG tablet Take 500 mg by mouth at bedtime.   oxyCODONE-acetaminophen 5-325 MG tablet Commonly known as:  PERCOCET/ROXICET Take 1 tablet by mouth every 4 (four) hours as needed.   SUMAtriptan 100 MG tablet Commonly known as:  IMITREX TAKE 1 TABLET BY MOUTH EVERY 2  HOURS AS NEEDED FOR MIGRAINE   topiramate 50 MG tablet Commonly known as:  TOPAMAX Take 1 tablet (50 mg total) by mouth daily.   zolmitriptan 5 MG tablet Commonly known as:  ZOMIG Take 1 tablet (5 mg total) by mouth as needed for migraine. Take at the earliest onset of migraine; make repeat once in 2 hours if needed, not to exceed 2 tablets in 24 hours; do not take more than 2 times a week       Allergies:  Allergies  Allergen Reactions  . No Known Allergies     Past Medical History:  Diagnosis Date  . 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 Fibroids   . Headache(784.0) 06/14/2007   Dr. Tomi Likens seeing pt. for HA management   . High blood pressure   . HYPERTENSION 06/14/2007  .  Hypothyroidism 2018   thyroidectomy   . Migraines   . Nodular goiter   . Obesity   . PONV (postoperative nausea and vomiting)   . Vitamin D deficiency     Past Surgical History:  Procedure Laterality Date  . [place]   x2, with epidural & general anesth.   Marland Kitchen DILATION AND CURETTAGE OF UTERUS     x2  . HYSTERECTOMY ABDOMINAL WITH SALPINGO-OOPHORECTOMY N/A 01/26/2018   Procedure: SUPRACERVICAL HYSTERECTOMY ABDOMINAL WITH SALPINGO-OOPHORECTOMY;  Surgeon: [name], MD;  Location: [facility];  Service: Gynecology;  Laterality: N/A;  . LYSIS OF ADHESION N/A 01/26/2018   Procedure: EXTENSIVE LYSIS OF ADHESIONS;  Surgeon: [name], MD;  Location: [facility];  Service: Gynecology;  Laterality: N/A;  . THYROIDECTOMY  11/18/2016  . THYROIDECTOMY N/A 11/18/2016   Procedure: TOTAL THYROIDECTOMY;  Surgeon: Armandina Gemma, MD;  Location: [facility];  Service: General;  Laterality: N/A;    Family History  Problem Relation Age of Onset  . Diabetes Mother   . Hypertension Mother   . Thyroid disease Mother     Social History:  reports that she has never smoked. She has never used smokeless tobacco. She reports that she does not drink alcohol or use drugs.      ROS  Hypertension:  Followed by PCP    Has history of migraines followed by neurologist Recently on Topamax  No history of diabetes, has generally visits with PCP  Lab Results  Component Value Date   HGBA1C 6.0 (H) 10/06/2016      Examination:   BP 138/80 (BP Location: Left Arm, Patient Position: Sitting, Cuff Size: Large)   Pulse (!) 106   Ht 5\' 5"  (1.651 m)   Wt 249 lb (112.9 kg)   LMP 01/26/2018   SpO2 97%   BMI 41.44 kg/m   She looks well No mass in the thyroid area  Assessment/Plan:  Postsurgical hypothyroidism  Her TSH is consistent now with taking 137 mcg levothyroxine Symptomatically doing well TSH almost the same as on the last visit even though she may have taken a lower dose just before her  labs She will continue the same regimen and follow-up in 6 months  Encouraged her to get back to diet and exercise when she recovers from her gynecological surgery Obesity: She may benefit from continuing Topamax and she will not take any phentermine in the future  She should have periodic screening with A1c from her PCP because of her obesity and family history  There are no Patient Instructions on file for this visit.   Elayne Snare 02/22/2018  Note: This office note was  prepared with Estate agent. Any transcriptional errors that result from this process are unintentional.

## 2018-02-24 ENCOUNTER — Encounter: Payer: Self-pay | Admitting: Neurology

## 2018-02-24 ENCOUNTER — Ambulatory Visit: Payer: 59 | Admitting: Neurology

## 2018-02-24 VITALS — BP 126/82 | HR 96 | Ht 65.0 in | Wt 248.0 lb

## 2018-02-24 DIAGNOSIS — G43111 Migraine with aura, intractable, with status migrainosus: Secondary | ICD-10-CM | POA: Diagnosis not present

## 2018-02-24 DIAGNOSIS — G43019 Migraine without aura, intractable, without status migrainosus: Secondary | ICD-10-CM | POA: Diagnosis not present

## 2018-02-24 MED ORDER — DIPHENHYDRAMINE HCL 50 MG/ML IJ SOLN
25.0000 mg | Freq: Once | INTRAMUSCULAR | Status: AC
  Administered 2018-02-24: 25 mg via INTRAMUSCULAR

## 2018-02-24 MED ORDER — TOPIRAMATE ER 50 MG PO CAP24: 1.0000 | ORAL_CAPSULE | Freq: Every day | ORAL | 0 refills | Status: DC

## 2018-02-24 MED ORDER — PREDNISONE 10 MG PO TABS: ORAL_TABLET | ORAL | 0 refills | Status: DC

## 2018-02-24 MED ORDER — METOCLOPRAMIDE HCL 5 MG/ML IJ SOLN
10.0000 mg | Freq: Once | INTRAVENOUS | Status: AC
  Administered 2018-02-24: 10 mg via INTRAMUSCULAR

## 2018-02-24 MED ORDER — ZOLMITRIPTAN 5 MG PO TABS: 5.0000 mg | ORAL_TABLET | ORAL | 3 refills | Status: DC | PRN

## 2018-02-24 MED ORDER — PROMETHAZINE HCL 12.5 MG PO TABS: 12.5000 mg | ORAL_TABLET | Freq: Three times a day (TID) | ORAL | 3 refills | Status: DC | PRN

## 2018-02-24 MED ORDER — KETOROLAC TROMETHAMINE 60 MG/2ML IM SOLN
60.0000 mg | Freq: Once | INTRAMUSCULAR | Status: AC
  Administered 2018-02-24: 60 mg via INTRAMUSCULAR

## 2018-02-24 NOTE — Addendum Note (Signed)
Addended by: Clois Comber on: 02/24/2018 01:48 PM   Modules accepted: Orders

## 2018-02-24 NOTE — Progress Notes (Signed)
NEUROLOGY FOLLOW UP OFFICE NOTE  ELIE GRAGERT [id number]  HISTORY OF PRESENT ILLNESS: [name] is a 52 year old left-handed female with hypertension and post-surgical hypothyroidism who follows up for migraines.     UPDATE:  Cambia?  Zomig NS? She stopped topiramate ER because she really wants to limit prescription medication.  Zomig NS effective but does not like nasal spray.  Imitrex injection effective but would rather avoid injections.  Due to increased headaches, topiramate was restarted.  She is taking IR however and is concerned about memory.  Cambia was ineffective.  She did not tolerate Zomig NS.  Zomig tablets are effective.  On 01/26/18, she underwent a hysterectomy.  For the past week, she has woken up with a daily headache.  She has taken left over sumatriptan as well as NSAIDs and Zomig.  Prior to last week, they were occurring once or twice a week.  Zomig would reduce intensity from severe to mild, but will last several hours.   Current NSAIDS:  naproxen(effective for mild), diclofenac 75mg   Current analgesics:  Tylenol (partially effective) Current triptans:  Zomig 5mg  Current anti-emetic:  promethazine 12.5mg  Current muscle relaxants:  no Current anti-anxiolytic:  no Current sleep aide:  no Antihypertensive medications:  metoprolol 50mg  Antidepressant medications:  no Anticonvulsant medications:  no Vitamins/Herbal/Supplements:  Magnesium citrate, riboflavin, CoQ10   Caffeine:  Coffee usually only for headache treatment Alcohol:  no Smoker:  no Diet:  Seeing a nutritionist.  Drinks water Exercise:  no Depression/stress:  Some stress Sleep hygiene:  Worse since change to day shift but has improved.   HISTORY: Onset:  She has had migraines since age 30, however they have steadily gotten worse over the past year and have been daily since last week. Location:  Varies (either side, holocephalic).  Over the past week, she has had left sided neck pain Quality:   pounding Initial Intensity:  10/10; December: 4/10 mild, 10/10 severe Aura:  no Prodrome:  no Associated symptoms:  Nausea, photophobia, sometimes vomiting Initial Duration:  Several hours to 2 days Initial Frequency:  Usually 2-3 times a month Triggers/exacerbating factors:  cycle Relieving factors:  Sleep, medication Activity:  Cannot function   Past abortive medication:  Tylenol, sumatriptan NS, sumatriptan 100mg  tablet, Relpax, ibuprofen 800mg , caffeine or hydrocodone, rizatriptan, Zembrace SymTouch (effective but makes her drowsy), sumatriptan 100mg  tablet (ineffective) Past preventative medication:  nortriptyline 10mg  (concerned about weight gain) Other past therapy:  none   Family history of headache:  No  PAST MEDICAL HISTORY: Past Medical History:  Diagnosis Date  . 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 Fibroids   . Headache(784.0) 06/14/2007   Dr. Tomi Likens seeing pt. for HA management   . High blood pressure   . HYPERTENSION 06/14/2007  . Hypothyroidism 2018   thyroidectomy   . Migraines   . Nodular goiter   . Obesity   . PONV (postoperative nausea and vomiting)   . Vitamin D deficiency     MEDICATIONS: Current Outpatient Medications on File Prior to Visit  Medication Sig Dispense Refill  . atenolol-chlorthalidone (TENORETIC) 50-25 MG tablet Take 1 tablet by mouth daily. 90 tablet 3  . cholecalciferol (VITAMIN D) 1000 units tablet Take 1,000 Units by mouth daily.    . Coenzyme Q10 (COQ10) 100 MG CAPS Take 1 capsule by mouth daily.    Marland Kitchen ibuprofen (ADVIL,MOTRIN) 200 MG tablet Take 800 mg by mouth every 8 (eight) hours as needed (for headache).    Marland Kitchen levothyroxine (SYNTHROID) 137 MCG  tablet Take 1 tablet (137 mcg total) by mouth daily before breakfast. 30 tablet 5  . magnesium oxide (MAG-OX) 400 MG tablet Take 400 mg by mouth daily.    Marland Kitchen oxyCODONE-acetaminophen (PERCOCET/ROXICET) 5-325 MG tablet Take 1 tablet by mouth every 4 (four) hours as needed. 30 tablet 0  . topiramate  (TOPAMAX) 50 MG tablet Take 1 tablet (50 mg total) by mouth daily. 30 tablet 3   No current facility-administered medications on file prior to visit.     ALLERGIES: Allergies  Allergen Reactions  . No Known Allergies     FAMILY HISTORY: Family History  Problem Relation Age of Onset  . Diabetes Mother   . Hypertension Mother   . Thyroid disease Mother     SOCIAL HISTORY: Social History   Socioeconomic History  . Marital status: Married    Spouse name: Not on file  . Number of children: 3  . Years of education: Not on file  . Highest education level: Not on file  Occupational History  . Occupation: Airline pilot: [employer]  . Financial resource strain: Not on file  . Food insecurity:    Worry: Not on file    Inability: Not on file  . Transportation needs:    Medical: Not on file    Non-medical: Not on file  Tobacco Use  . Smoking status: Never Smoker  . Smokeless tobacco: Never Used  Substance and Sexual Activity  . Alcohol use: No  . Drug use: No  . Sexual activity: Yes    Birth control/protection: None  Lifestyle  . Physical activity:    Days per week: Not on file    Minutes per session: Not on file  . Stress: Not on file  Relationships  . Social connections:    Talks on phone: Not on file    Gets together: Not on file    Attends religious service: Not on file    Active member of club or organization: Not on file    Attends meetings of clubs or organizations: Not on file    Relationship status: Not on file  . Intimate partner violence:    Fear of current or ex partner: Not on file    Emotionally abused: Not on file    Physically abused: Not on file    Forced sexual activity: Not on file  Other Topics Concern  . Not on file  Social History Narrative  . Not on file    REVIEW OF SYSTEMS: Constitutional: No fevers, chills, or sweats, no generalized fatigue, change in appetite Eyes: No visual changes, double vision,  eye pain Ear, nose and throat: No hearing loss, ear pain, nasal congestion, sore throat Cardiovascular: No chest pain, palpitations Respiratory:  No shortness of breath at rest or with exertion, wheezes GastrointestinaI: No nausea, vomiting, diarrhea, abdominal pain, fecal incontinence Genitourinary:  No dysuria, urinary retention or frequency Musculoskeletal:  No neck pain, back pain Integumentary: No rash, pruritus, skin lesions Neurological: as above Psychiatric: No depression, insomnia, anxiety Endocrine: No palpitations, fatigue, diaphoresis, mood swings, change in appetite, change in weight, increased thirst Hematologic/Lymphatic:  No purpura, petechiae. Allergic/Immunologic: no itchy/runny eyes, nasal congestion, recent allergic reactions, rashes  PHYSICAL EXAM: Vitals:   02/24/18 1139  BP: 126/82  Pulse: 96  SpO2: 98%   General: No acute distress.  Patient appears well-groomed.   Head:  Normocephalic/atraumatic Eyes:  Fundi examined but not visualized Neck: supple, no paraspinal tenderness, full range  of motion Heart:  Regular rate and rhythm Lungs:  Clear to auscultation bilaterally Back: No paraspinal tenderness Neurological Exam: alert and oriented to person, place, and time. Attention span and concentration intact, recent and remote memory intact, fund of knowledge intact.  Speech fluent and not dysarthric, language intact.  CN II-XII intact. Bulk and tone normal, muscle strength 5/5 throughout.  Sensation to light touch  intact.  Deep tendon reflexes 2+ throughout.  Finger to nose testing intact.  Gait normal  IMPRESSION: Migraine without aura, intractable, daily for past week.  PLAN: 1.  Headache cocktail here (Toradol 60mg Lenard Galloway 25mg /Reglan 10mg ). 2.  If she wakes up tomorrow with migraine, advised to start prednisone taper. 3.  Zomig 5mg  with promethazine for abortive therapy (with or without Tylenol) 4.  Stop sumatriptan 5.  Limit all pain relievers to no  more than 2 days out of week to prevent rebound headache 6.  Headache diary 7.  Change topiramate to ER 50mg  daily.  If headaches persist, then will increase dose to 100mg . 8.  Follow up in 3 months.  Metta Clines, DO  CC:  Dr. Burnice Logan

## 2018-02-24 NOTE — Patient Instructions (Signed)
1.  We will give you a headache cocktail now. 2.  If you wake up tomorrow with another headache, then fill the prednisone taper:  Take 6tabs x1day, then 5tabs x1day, then 4tabs x1day, then 3tabs x1day, then 2tabs x1day, then 1tab x1day, then STOP 3.  Take Zomig at earliest onset of migraine.  May repeat once after two hours if needed, not to exceed 2 tablets in 24 hours. 4.  Limit use of ALL pain relievers (Zomig, Tylenol, Advil, etc) to no more than 2 days out of week to prevent rebound headache.  That means you only have 2 days in a week to take a pain reliever. 5.  Keep headache diary 6.  Promethazine for nausea 7.  If headaches persist, then we will need to increase topiramate.  Will change to extended release form (also once a day) 8.  Follow up in  3 months.

## 2018-02-25 ENCOUNTER — Telehealth: Payer: Self-pay | Admitting: Neurology

## 2018-02-25 MED FILL — predniSONE 10 MG TABS: 10 | 6 days supply | Qty: 21 | Fill #0

## 2018-02-25 MED FILL — PROMETHAZINE 12.5 MG TABLET: 12.5 | 10 days supply | Qty: 30 | Fill #0

## 2018-02-25 MED FILL — ZOLMitriptan 5 MG TABS: 5 | 30 days supply | Qty: 10 | Fill #0

## 2018-02-25 NOTE — Telephone Encounter (Signed)
Called Pt, advsd her she was given an Rx while in the office yesterday to fill.

## 2018-02-25 NOTE — Telephone Encounter (Signed)
*  STAT* If patient is at the pharmacy, call can be transferred to refill team.  1.     Which medications need to be refilled? (please list name of each medication and dose if know) Prednisone  2.     Which pharmacy/location (including street and city if local pharmacy) is medication to be sent to? [facility]  3.     Do they need a 30 or 90 day supply?

## 2018-03-01 MED FILL — ZOLMitriptan 5 MG TABS: 5 | 30 days supply | Qty: 10 | Fill #1

## 2018-03-04 ENCOUNTER — Telehealth: Payer: Self-pay | Admitting: Neurology

## 2018-03-04 NOTE — Telephone Encounter (Signed)
Called and spoke with Pt. She is c/o of headache. She is taking IBU daily for post surgical pain (hysterectomy). She was confused about how to take Zomig. Advised her she could take one for headache, repeat in 2 hours if needed, but no more than 2 in 24hrs. We discussed the increase in topriamate and giving it time to work.

## 2018-03-04 NOTE — Telephone Encounter (Signed)
Patient called and said this was a follow up call, that she was in last week? Please Call. Thanks

## 2018-03-05 ENCOUNTER — Ambulatory Visit (INDEPENDENT_AMBULATORY_CARE_PROVIDER_SITE_OTHER): Payer: 59 | Admitting: Obstetrics & Gynecology

## 2018-03-05 ENCOUNTER — Encounter: Payer: Self-pay | Admitting: Radiology

## 2018-03-05 ENCOUNTER — Encounter: Payer: Self-pay | Admitting: Obstetrics & Gynecology

## 2018-03-05 VITALS — BP 153/74 | HR 71 | Wt 253.0 lb

## 2018-03-05 DIAGNOSIS — Z9889 Other specified postprocedural states: Secondary | ICD-10-CM

## 2018-03-05 NOTE — Progress Notes (Signed)
   Subjective:    Patient ID: [name], female    DOB: September 05, 1966, 52 y.o.   MRN: [id number]  HPI 52 yo married lady here for a 6 week post op visit after having a supracervical hysterectomy and BSO for fibroids and menorrhagia. She is so happy, she no longer has to get up multiple times per night to void, her pelvic pain and bleeding are gone. She is still sore and would like to return to work at the 8 week point (works at [employer] as a Marine scientist).   Review of Systems     Objective:   Physical Exam Breathing, conversing, and ambulating normally Well nourished, well hydrated Black female, no apparent distress Incision- healed great (I went through her previous 2 cesarean section scar) Abd- benign     Assessment & Plan:  Post op- doing well Come back for pap in 2020 We discussed her need for weight loss for improving her overall health. She is agreeable.

## 2018-03-11 ENCOUNTER — Encounter: Payer: Self-pay | Admitting: *Deleted

## 2018-03-30 ENCOUNTER — Telehealth: Payer: Self-pay

## 2018-03-30 NOTE — Telephone Encounter (Signed)
Pt returned call, the FMLA is only for intermittent leave. I advised her we will try to complete and fax today

## 2018-03-30 NOTE — Telephone Encounter (Signed)
LMOVM for Pt to return my call concerning the FMLA paperwork

## 2018-04-02 MED FILL — ZOLMitriptan 5 MG TABS: 5 | 30 days supply | Qty: 10 | Fill #2

## 2018-04-02 MED FILL — LEVOTHYROXINE 137 MCG TABLE: 137 | 30 days supply | Qty: 30 | Fill #1

## 2018-04-07 ENCOUNTER — Telehealth: Payer: Self-pay

## 2018-04-07 NOTE — Telephone Encounter (Signed)
Rcvd VM from Pt concerning FMLA. Called Pt, [employer] claims never rcvd. Faxed again.

## 2018-04-09 ENCOUNTER — Encounter: Payer: Self-pay | Admitting: Neurology

## 2018-04-14 ENCOUNTER — Other Ambulatory Visit: Payer: Self-pay

## 2018-04-14 ENCOUNTER — Ambulatory Visit (INDEPENDENT_AMBULATORY_CARE_PROVIDER_SITE_OTHER): Payer: 59 | Admitting: Physician Assistant

## 2018-04-14 ENCOUNTER — Encounter: Payer: Self-pay | Admitting: Physician Assistant

## 2018-04-14 VITALS — BP 142/68 | HR 104 | Temp 98.7°F | Resp 18 | Ht 65.0 in | Wt 253.4 lb

## 2018-04-14 DIAGNOSIS — Z114 Encounter for screening for human immunodeficiency virus [HIV]: Secondary | ICD-10-CM | POA: Diagnosis not present

## 2018-04-14 DIAGNOSIS — Z23 Encounter for immunization: Secondary | ICD-10-CM

## 2018-04-14 DIAGNOSIS — Z1239 Encounter for other screening for malignant neoplasm of breast: Secondary | ICD-10-CM

## 2018-04-14 DIAGNOSIS — Z0000 Encounter for general adult medical examination without abnormal findings: Secondary | ICD-10-CM | POA: Diagnosis not present

## 2018-04-14 DIAGNOSIS — Z13228 Encounter for screening for other metabolic disorders: Secondary | ICD-10-CM

## 2018-04-14 DIAGNOSIS — Z130 Encounter for screening for diseases of the blood and blood-forming organs and certain disorders involving the immune mechanism: Secondary | ICD-10-CM

## 2018-04-14 DIAGNOSIS — Z1211 Encounter for screening for malignant neoplasm of colon: Secondary | ICD-10-CM | POA: Diagnosis not present

## 2018-04-14 DIAGNOSIS — Z1321 Encounter for screening for nutritional disorder: Secondary | ICD-10-CM | POA: Diagnosis not present

## 2018-04-14 DIAGNOSIS — Z1329 Encounter for screening for other suspected endocrine disorder: Secondary | ICD-10-CM | POA: Diagnosis not present

## 2018-04-14 DIAGNOSIS — Z13220 Encounter for screening for lipoid disorders: Secondary | ICD-10-CM | POA: Diagnosis not present

## 2018-04-14 DIAGNOSIS — Z1231 Encounter for screening mammogram for malignant neoplasm of breast: Secondary | ICD-10-CM | POA: Diagnosis not present

## 2018-04-14 DIAGNOSIS — Z131 Encounter for screening for diabetes mellitus: Secondary | ICD-10-CM

## 2018-04-14 DIAGNOSIS — I10 Essential (primary) hypertension: Secondary | ICD-10-CM

## 2018-04-14 MED ORDER — ATENOLOL-CHLORTHALIDONE 50-25 MG PO TABS: 1.0000 | ORAL_TABLET | Freq: Every day | ORAL | 3 refills | Status: DC

## 2018-04-14 MED FILL — ATENOLOL/CHLORTHAL 50/25: 50-25 | 90 days supply | Qty: 90 | Fill #0

## 2018-04-14 NOTE — Patient Instructions (Addendum)
If you don't hear anything about your mammogram or cologaurd in the next two weeks then please call me or better yet, MyChart me so I can find out    IF you received an x-ray today, you will receive an invoice from [facility]. Please contact [facility] at [phone] with questions or concerns regarding your invoice.   IF you received labwork today, you will receive an invoice from [facility]. Please contact [facility] at [phone] with questions or concerns regarding your invoice.   Our billing staff will not be able to assist you with questions regarding bills from these companies.  You will be contacted with the lab results as soon as they are available. The fastest way to get your results is to activate your My Chart account. Instructions are located on the last page of this paperwork. If you have not heard from [name] regarding the results in 2 weeks, please contact this office.

## 2018-04-14 NOTE — Progress Notes (Signed)
04/14/2018 3:07 PM   DOB: 1966-05-08 / MRN: [id number]  SUBJECTIVE:  [name] is a 52 y.o. female presenting for annual exam.  Last Pap smear was normal.  She is never had a colon screening is willing to do the Cologuard today.  She has a history of hypertension.  Recently had her thyroid removed.  She would like for me to carry her medication in this regard.  Continues to try to lose weight.  Has a history of prediabetes.  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  Sees Dr. Hulan Fray for GYN care.  Has not had a mammogram recently.  Has a history of headaches managed by neurology.  She is trying to get more exercise on a daily basis, however this has been a struggle for her in the past.  She is allergic to no known allergies.   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    She  reports that she has never smoked. She has never used smokeless tobacco. She reports that she does not drink alcohol or use drugs. She  reports that she currently engages in sexual activity. She reports using the following method of birth control/protection: None. The patient  has a past surgical history that includes Dilation and curettage of uterus; Cesarean section ([place]); Thyroidectomy (11/18/2016); Thyroidectomy (N/A, 11/18/2016); Hysterectomy abdominal with salpingo-oophorectomy (N/A, 01/26/2018); Lysis of adhesion (N/A, 01/26/2018); Abdominal hysterectomy; and Thyroidectomy.  Her family history includes Diabetes in her mother; Hypertension in her mother; Thyroid disease in her mother.  Review of Systems  Constitutional: Negative for chills, diaphoresis and fever.  Eyes: Negative.   Respiratory: Negative for cough, hemoptysis, sputum production, shortness of breath and  wheezing.   Cardiovascular: Negative for chest pain, orthopnea and leg swelling.  Gastrointestinal: Negative for abdominal pain, blood in stool, constipation, diarrhea, heartburn, melena, nausea and vomiting.  Genitourinary: Negative for dysuria, flank pain, frequency, hematuria and urgency.  Skin: Negative for rash.  Neurological: Negative for dizziness, sensory change, speech change, focal weakness and headaches.    The problem list and medications were reviewed and updated by myself where necessary and exist elsewhere in the encounter.   OBJECTIVE:  BP (!) 142/68 (BP Location: Left Arm, Patient Position: Sitting, Cuff Size: Large)   Pulse (!) 104   Temp 98.7 F (37.1 C) (Oral)   Resp 18   Ht 5\' 5"  (1.651 m)   Wt 253 lb 6.4 oz (114.9 kg)   SpO2 97%   BMI 42.17 kg/m   Wt Readings from Last 3 Encounters:  04/14/18 253 lb 6.4 oz (114.9 kg)  03/05/18 253 lb (114.8 kg)  02/24/18 248 lb (112.5 kg)   Temp Readings from Last 3 Encounters:  04/14/18 98.7 F (37.1 C) (Oral)  10/24/17 98.7 F (37.1 C) (Oral)  10/10/17 98.6 F (37 C) (Oral)   BP Readings from Last 3 Encounters:  04/14/18 (!) 142/68  03/05/18 (!) 153/74  02/24/18 126/82   Pulse Readings from Last 3 Encounters:  04/14/18 (!) 104  03/05/18 71  02/24/18 96    Physical Exam  Constitutional: She is oriented to person, place, and time. She appears well-nourished.  Non-toxic appearance. No distress.  Eyes: Pupils are equal, round, and reactive to light. EOM are normal.  Cardiovascular: Normal rate, regular rhythm, S1 normal, S2 normal,  normal heart sounds and intact distal pulses. Exam reveals no gallop, no friction rub and no decreased pulses.  No murmur heard. Pulmonary/Chest: Effort normal. No stridor. No respiratory distress. She has no wheezes. She has no rales.  Abdominal: She exhibits no distension.  Musculoskeletal: She exhibits no edema.  Neurological: She is alert and oriented to person, place, and  time. She has normal strength and normal reflexes. She is not disoriented. She displays no atrophy. No cranial nerve deficit or sensory deficit. She exhibits normal muscle tone. Coordination and gait normal.  Skin: Skin is warm and dry. She is not diaphoretic. No pallor.  Psychiatric: She has a normal mood and affect. Her behavior is normal.  Vitals reviewed.   Lab Results  Component Value Date   HGBA1C 6.0 (H) 10/06/2016    Lab Results  Component Value Date   WBC 13.7 (H) 01/27/2018   HGB 10.5 (L) 01/27/2018   HCT 30.7 (L) 01/27/2018   MCV 76.4 (L) 01/27/2018   PLT 281 01/27/2018    Lab Results  Component Value Date   CREATININE 0.84 01/27/2018   BUN 21 (H) 01/15/2018   NA 136 01/15/2018   K 3.2 (L) 01/15/2018   CL 102 01/15/2018   CO2 24 01/15/2018    Lab Results  Component Value Date   ALT 17 07/24/2017   AST 20 07/24/2017   ALKPHOS 95 07/24/2017   BILITOT <0.2 07/24/2017    Lab Results  Component Value Date   TSH 1.44 02/19/2018    Lab Results  Component Value Date   CHOL 130 10/06/2016   HDL 43 10/06/2016   LDLCALC 68 10/06/2016   TRIG 95 10/06/2016   CHOLHDL 3.3 09/05/2016     ASSESSMENT AND PLAN:  [name] was seen today for annual exam.  Diagnoses and all orders for this visit:  Annual physical exam  Screening for deficiency anemia -     CBC  Screening for diabetes mellitus -     Hemoglobin A1c  Screening for metabolic disorder -     Basic metabolic panel -     Hepatic function panel  Screening for lipid disorders -     Lipid panel  Screening for HIV (human immunodeficiency virus) -     HIV antibody  Screen for colon cancer -     Cologuard  Screening for breast cancer -     MM DIGITAL SCREENING BILATERAL; Future  Screening for thyroid disorder -     T4, Free  Hypertension, unspecified type -     atenolol-chlorthalidone (TENORETIC) 50-25 MG tablet; Take 1 tablet by mouth daily.  Encounter for screening for nutritional  disorder -     Vitamin D, 25-hydroxy  Need for diphtheria-tetanus-pertussis (Tdap) vaccine -     Tdap vaccine greater than or equal to 7yo IM    The patient is advised to call or return to clinic if she does not see an improvement in symptoms, or to seek the care of the closest emergency department if she worsens with the above plan.   Philis Fendt, MHS, PA-C [facility] 04/14/2018 3:07 PM

## 2018-04-15 ENCOUNTER — Encounter: Payer: Self-pay | Admitting: Physician Assistant

## 2018-04-15 LAB — HIV ANTIBODY (ROUTINE TESTING W REFLEX): HIV SCREEN 4TH GENERATION: NONREACTIVE

## 2018-04-15 LAB — HEMOGLOBIN A1C
ESTIMATED AVERAGE GLUCOSE: 143 mg/dL
Hgb A1c MFr Bld: 6.6 % — ABNORMAL HIGH (ref 4.8–5.6)

## 2018-04-15 LAB — LIPID PANEL
CHOL/HDL RATIO: 4 ratio (ref 0.0–4.4)
Cholesterol, Total: 151 mg/dL (ref 100–199)
HDL: 38 mg/dL — AB (ref >39)
LDL CALC: 77 mg/dL (ref 0–99)
Triglycerides: 179 mg/dL — ABNORMAL HIGH (ref 0–149)
VLDL CHOLESTEROL CAL: 36 mg/dL (ref 5–40)

## 2018-04-15 LAB — CBC
HEMOGLOBIN: 13.1 g/dL (ref 11.1–15.9)
Hematocrit: 39.5 % (ref 34.0–46.6)
MCH: 25.1 pg — ABNORMAL LOW (ref 26.6–33.0)
MCHC: 33.2 g/dL (ref 31.5–35.7)
MCV: 76 fL — ABNORMAL LOW (ref 79–97)
Platelets: 362 10*3/uL (ref 150–450)
RBC: 5.22 x10E6/uL (ref 3.77–5.28)
RDW: 16 % — ABNORMAL HIGH (ref 12.3–15.4)
WBC: 7.8 10*3/uL (ref 3.4–10.8)

## 2018-04-15 LAB — BASIC METABOLIC PANEL
BUN/Creatinine Ratio: 13 (ref 9–23)
BUN: 11 mg/dL (ref 6–24)
CALCIUM: 9.7 mg/dL (ref 8.7–10.2)
CHLORIDE: 102 mmol/L (ref 96–106)
CO2: 25 mmol/L (ref 20–29)
Creatinine, Ser: 0.84 mg/dL (ref 0.57–1.00)
GFR calc non Af Amer: 81 mL/min/{1.73_m2} (ref >59)
GFR, EST AFRICAN AMERICAN: 94 mL/min/{1.73_m2} (ref >59)
Glucose: 102 mg/dL — ABNORMAL HIGH (ref 65–99)
POTASSIUM: 4 mmol/L (ref 3.5–5.2)
Sodium: 143 mmol/L (ref 134–144)

## 2018-04-15 LAB — VITAMIN D 25 HYDROXY (VIT D DEFICIENCY, FRACTURES): VIT D 25 HYDROXY: 43.3 ng/mL (ref 30.0–100.0)

## 2018-04-15 LAB — HEPATIC FUNCTION PANEL
ALBUMIN: 4.2 g/dL (ref 3.5–5.5)
ALT: 18 IU/L (ref 0–32)
AST: 19 IU/L (ref 0–40)
Alkaline Phosphatase: 113 IU/L (ref 39–117)
BILIRUBIN TOTAL: 0.3 mg/dL (ref 0.0–1.2)
BILIRUBIN, DIRECT: 0.11 mg/dL (ref 0.00–0.40)
TOTAL PROTEIN: 7.3 g/dL (ref 6.0–8.5)

## 2018-04-15 LAB — T4, FREE: Free T4: 1.31 ng/dL (ref 0.82–1.77)

## 2018-04-16 ENCOUNTER — Other Ambulatory Visit: Payer: Self-pay | Admitting: Physician Assistant

## 2018-04-16 MED ORDER — METFORMIN HCL ER 750 MG PO TB24: 750.0000 mg | ORAL_TABLET | Freq: Every day | ORAL | 1 refills | Status: DC

## 2018-04-16 MED FILL — METFORMIN HCL ER 750 MG TAB: 750 | 90 days supply | Qty: 90 | Fill #0

## 2018-04-26 LAB — IRON: Iron: 42 ug/dL (ref 27–159)

## 2018-04-26 LAB — PATHOLOGIST SMEAR REVIEW
BASOS ABS: 0 10*3/uL (ref 0.0–0.2)
Basos: 0 %
EOS (ABSOLUTE): 0.1 10*3/uL (ref 0.0–0.4)
Eos: 1 %
HEMATOCRIT: 41.5 % (ref 34.0–46.6)
HEMOGLOBIN: 13.1 g/dL (ref 11.1–15.9)
IMMATURE GRANULOCYTES: 0 %
Immature Grans (Abs): 0 10*3/uL (ref 0.0–0.1)
LYMPHS ABS: 2 10*3/uL (ref 0.7–3.1)
Lymphs: 26 %
MCH: 25.6 pg — AB (ref 26.6–33.0)
MCHC: 31.6 g/dL (ref 31.5–35.7)
MCV: 81 fL (ref 79–97)
MONOS ABS: 0.5 10*3/uL (ref 0.1–0.9)
Monocytes: 6 %
NEUTROS ABS: 5.1 10*3/uL (ref 1.4–7.0)
NEUTROS PCT: 67 %
PLATELETS: 385 10*3/uL (ref 150–450)
Path Rev PLTs: NORMAL
Path Rev WBC: NORMAL
RBC: 5.11 x10E6/uL (ref 3.77–5.28)
RDW: 15.2 % (ref 12.3–15.4)
WBC: 7.7 10*3/uL (ref 3.4–10.8)

## 2018-04-26 LAB — SPECIMEN STATUS REPORT

## 2018-05-06 ENCOUNTER — Encounter: Payer: Self-pay | Admitting: Physician Assistant

## 2018-05-07 ENCOUNTER — Other Ambulatory Visit: Payer: Self-pay | Admitting: Neurology

## 2018-05-07 MED FILL — LEVOTHYROXINE 137 MCG TABLE: 137 | 30 days supply | Qty: 30 | Fill #2

## 2018-05-07 NOTE — Telephone Encounter (Signed)
[facility] called to see if she could possible get a week supply of samples, because the pharmacy said it would take a little while to get approved by pharmacy.

## 2018-05-10 ENCOUNTER — Other Ambulatory Visit: Payer: Self-pay

## 2018-05-10 DIAGNOSIS — G43019 Migraine without aura, intractable, without status migrainosus: Secondary | ICD-10-CM

## 2018-05-10 MED ORDER — TOPIRAMATE ER 50 MG PO CAP24
50.0000 mg | ORAL_CAPSULE | Freq: Every day | ORAL | 0 refills | Status: DC
Start: 2018-05-10 — End: 2018-10-22

## 2018-05-10 NOTE — Telephone Encounter (Signed)
Called and LMOVM advising Pt samples are at front desk

## 2018-05-11 ENCOUNTER — Telehealth: Payer: Self-pay | Admitting: *Deleted

## 2018-05-11 NOTE — Telephone Encounter (Signed)
Patient is inquiring about a renal ultrasound.

## 2018-05-18 ENCOUNTER — Telehealth: Payer: Self-pay | Admitting: Physician Assistant

## 2018-05-18 NOTE — Telephone Encounter (Signed)
Pt returning call and wants to cancel appt but does not want to reschedule at this time.

## 2018-05-24 ENCOUNTER — Telehealth: Payer: 59 | Admitting: Family

## 2018-05-24 DIAGNOSIS — R399 Unspecified symptoms and signs involving the genitourinary system: Secondary | ICD-10-CM

## 2018-05-24 MED ORDER — CEPHALEXIN 500 MG PO CAPS: 500.0000 mg | ORAL_CAPSULE | Freq: Two times a day (BID) | ORAL | 0 refills | Status: DC

## 2018-05-24 NOTE — Progress Notes (Signed)

## 2018-05-27 ENCOUNTER — Telehealth: Payer: Self-pay | Admitting: Neurology

## 2018-05-27 MED FILL — TROKENDI XR 50 MG CAPSULE: 50 | 30 days supply | Qty: 30 | Fill #0

## 2018-05-27 NOTE — Telephone Encounter (Signed)
Patient called wanting to check on her prior authorization for her Trokendi medication. She said she would be at work all day and to leave a detailed voicemail at [phone]. Her pharmacy is the [facility] on [address] Thanks.

## 2018-05-28 IMAGING — US US PELVIS COMPLETE TRANSABD/TRANSVAG
1 series · 15 of 25 positions shown · non-contrast
Comparison: None

CLINICAL DATA: Follow-up fibroids.  Abnormal uterine bleeding



[Series 1: us pelvis complete transabd/transvag · 15 of 41 slices shown]
[im 1/41]
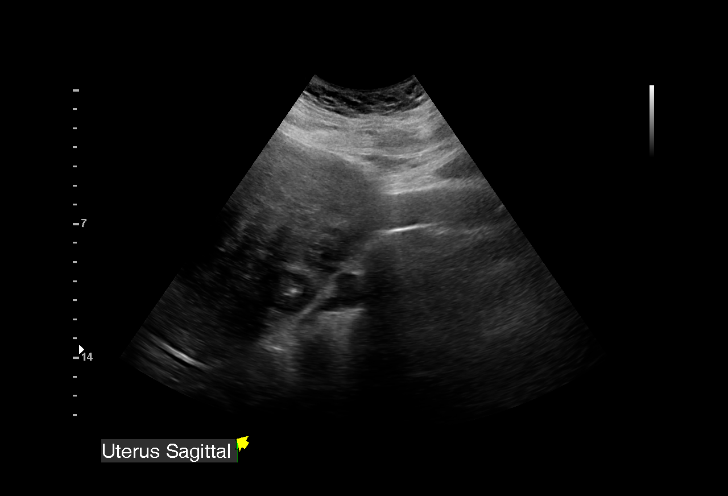
[im 4/41]
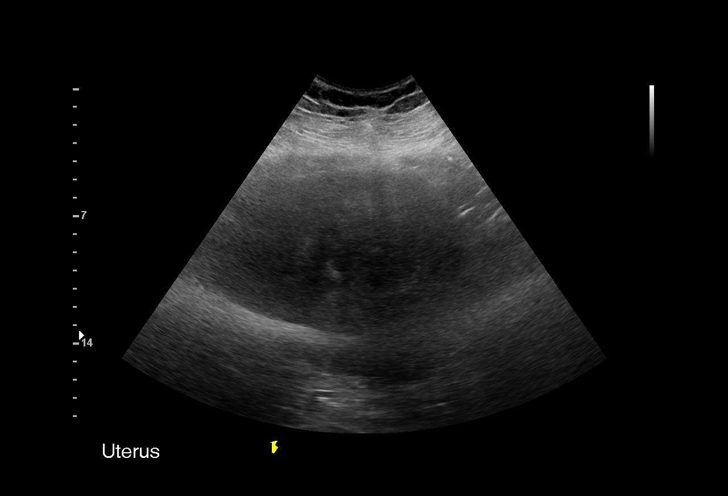
[im 7/41]
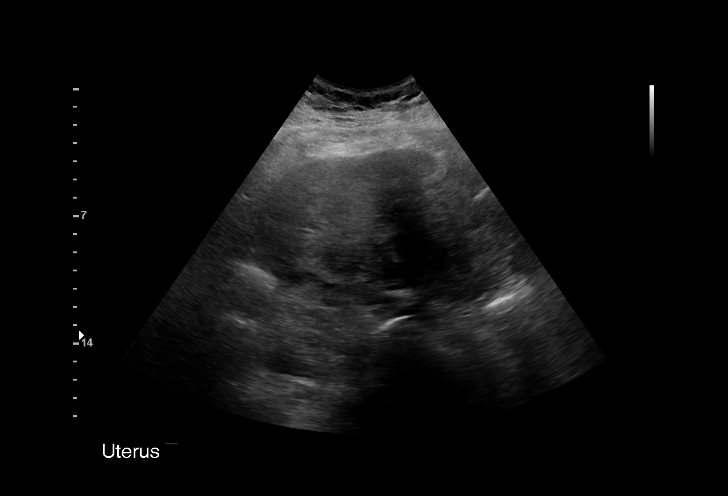
[im 9/41]
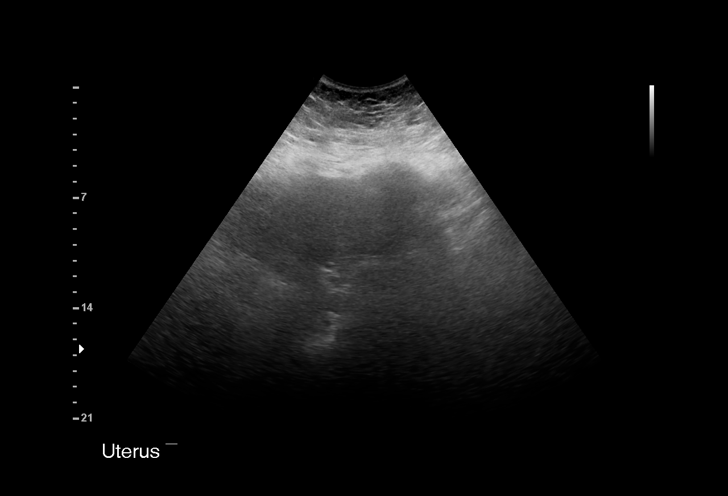
[im 12/41]
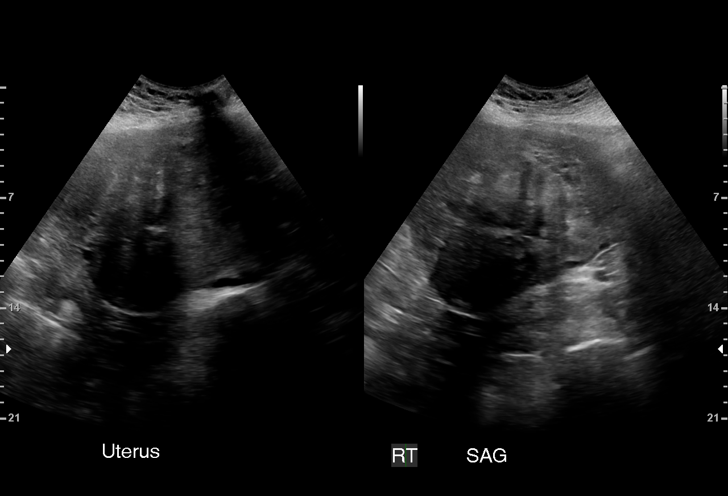
[im 16/41]
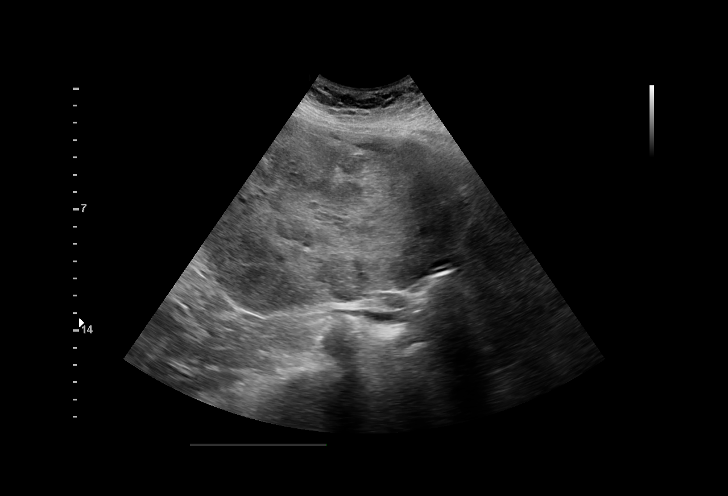
[im 17/41]
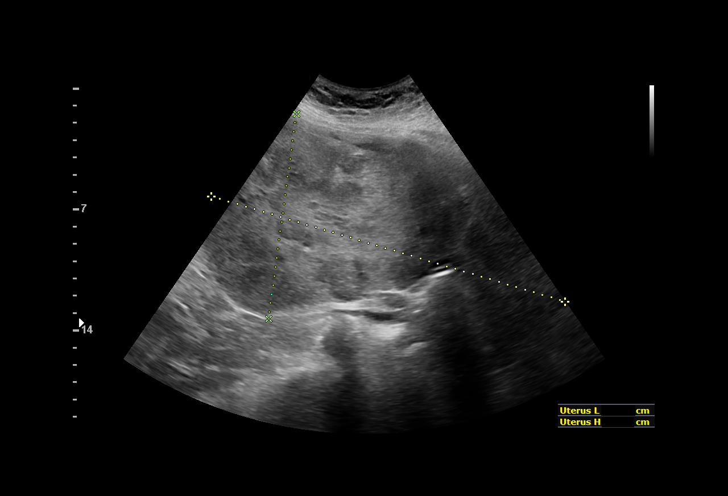
[im 21/41]
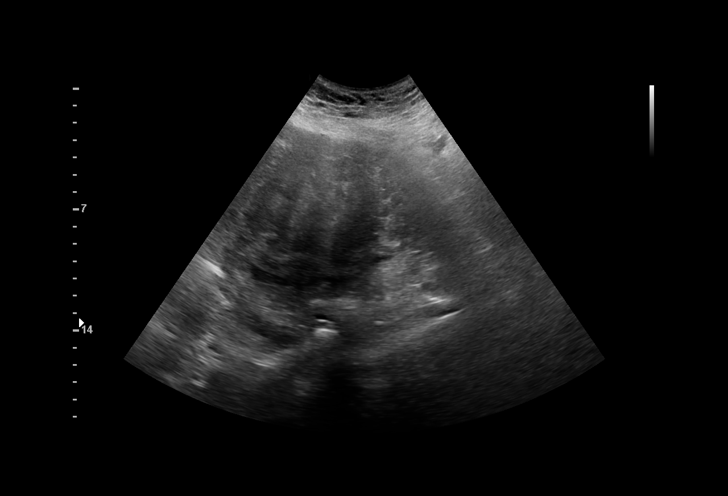
[im 24/41]
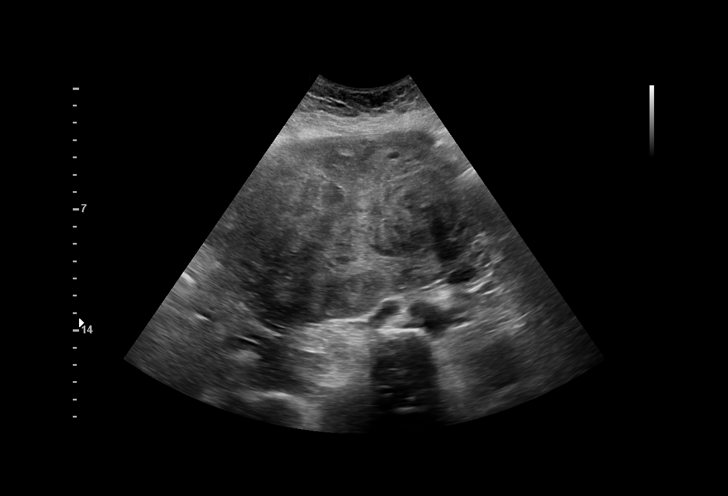
[im 26/41]
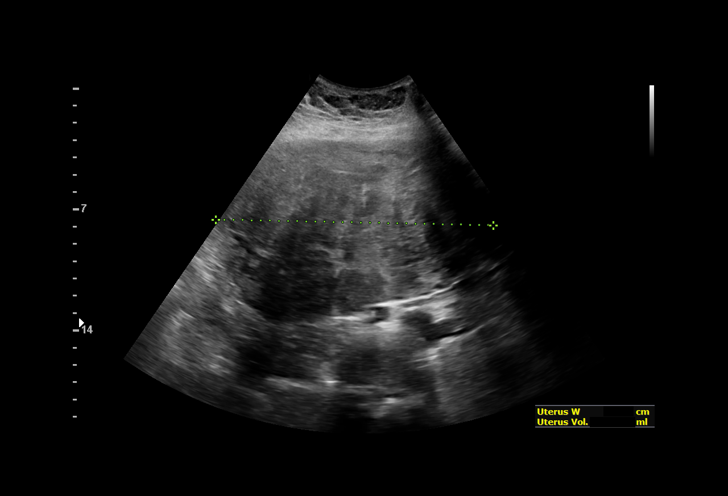
[im 29/41]
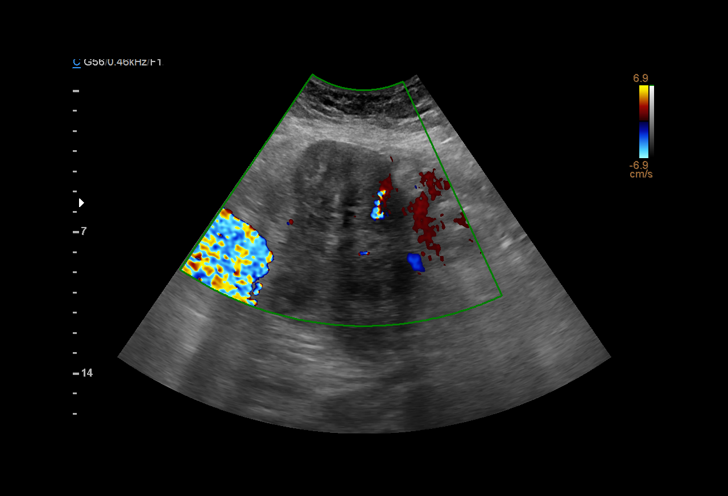
[im 32/41]
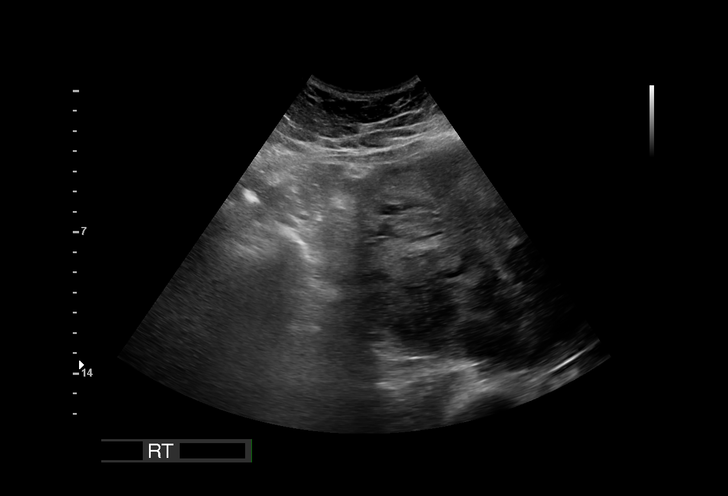
[im 34/41]
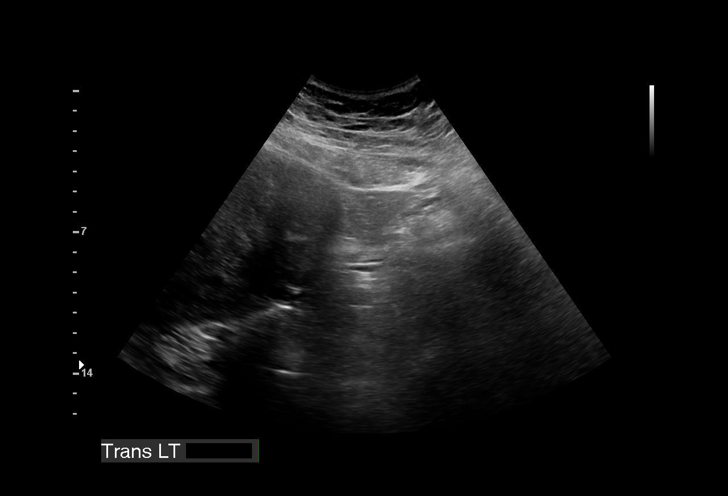
[im 37/41]
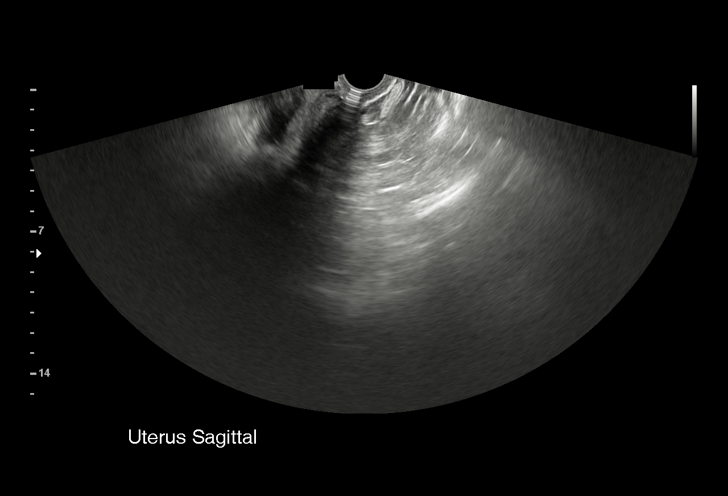
[im 41/41]
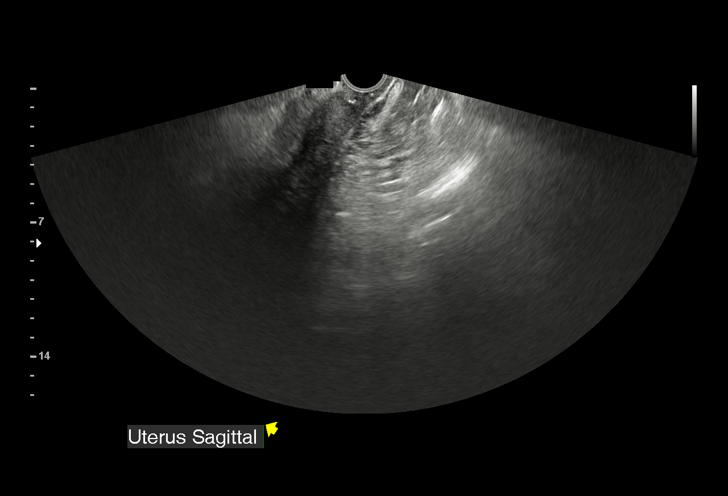

[15 of 25 positions shown; findings below may reference images not displayed]

FINDINGS: Uterus

Measurements: 21.3 x 11.9 x 16.0 cm. Numerous fibroids, including a
5.0 cm fundal fibroid, 7.3 cm left fundal fibroid posteriorly, and
7.6 cm right fundal fibroid posteriorly. These are all subserosal in
location. Diffuse heterogeneous echotexture throughout the remainder
of the uterus with likely other smaller focal fibroids.

Endometrium

Thickness: Unable to visualize due to fibroids.

Right ovary

Measurements: Not visualized.  No adnexal mass seen.

Left ovary

Measurements: Not visualized.  No adnexal mass seen.

Other findings

No abnormal free fluid.
IMPRESSION: Enlarged fibroid uterus.

Endometrium not visible due to fibroid disease.

## 2018-05-31 ENCOUNTER — Telehealth: Payer: Self-pay | Admitting: Neurology

## 2018-05-31 ENCOUNTER — Ambulatory Visit: Payer: 59

## 2018-05-31 NOTE — Telephone Encounter (Signed)
Called and spoke with Pt. She has taken the zolmitriptan, reapeated in 2hrs, and used excedrin, no relief. Pt will come for headache cocktail if she has a driver, if not, toradol injection

## 2018-05-31 NOTE — Telephone Encounter (Signed)
Patient is calling trying to get into the schedule today for a headache cocktail. Stated she needed to be seen immediately and wanted to speak with nurse. Please call her back at [phone]. Thanks.

## 2018-06-07 MED FILL — LEVOTHYROXINE 137 MCG TABLE: 137 | 30 days supply | Qty: 30 | Fill #3

## 2018-06-11 ENCOUNTER — Ambulatory Visit: Payer: 59 | Admitting: Neurology

## 2018-06-17 ENCOUNTER — Ambulatory Visit (INDEPENDENT_AMBULATORY_CARE_PROVIDER_SITE_OTHER): Payer: Self-pay | Admitting: Nurse Practitioner

## 2018-06-17 VITALS — BP 120/82 | HR 77 | Temp 98.9°F | Resp 18 | Wt 255.2 lb

## 2018-06-17 DIAGNOSIS — K047 Periapical abscess without sinus: Secondary | ICD-10-CM

## 2018-06-17 DIAGNOSIS — K0889 Other specified disorders of teeth and supporting structures: Secondary | ICD-10-CM

## 2018-06-17 MED ORDER — AMOXICILLIN 500 MG PO CAPS: 500.0000 mg | ORAL_CAPSULE | Freq: Three times a day (TID) | ORAL | 0 refills | Status: AC

## 2018-06-17 NOTE — Progress Notes (Signed)
NEUROLOGY FOLLOW UP OFFICE NOTE  [name] [id number]  HISTORY OF PRESENT ILLNESS: [name] is a 52 year old left-handed female with hypertension and acquired hypothyroidism who follows up for migraines.  UPDATE: She has been having a moderate to severe migraine for the past 2 weeks.  It will break with Zomig and ibuprofen for just a few hours but will then return.  She was also having right sided neck pain which has resolved.  Current NSAIDS:  ibuprofen Current analgesics:  no Current triptans:  Zomig 5mg  tablet Current ergotamine:  no Current anti-emetic:  Promethazine 12.5mg  Current muscle relaxants:  no Current anti-anxiolytic:  no Current sleep aide:  no Current Antihypertensive medications:  Atenolol-chlorthalidone 50-25mg  Current Antidepressant medications:  no Current Anticonvulsant medications:  Trokendi XR 50mg  Current anti-CGRP:  no Current Vitamins/Herbal/Supplements:  no Current Antihistamines/Decongestants:  no Other therapy:  no  Caffeine:  Coffee usually only for headache treatment Alcohol:  no Smoker:  no Diet:  Seeing nutritionist.  Drinks water Exercise:  no Depression:  no; Anxiety:  mild Sleep hygiene:  okay  HISTORY: Onset: She has had migraines since age 34, however they have steadily gotten worse over the past year and have been daily since last week. Location:  Varies(either side, holocephalic). Over the past week, she has had left sided neck pain Quality: pounding Initial Intensity: 10/10; December: 4/10 mild, 10/10 severe Aura: no Prodrome: no Associated symptoms: Nausea, photophobia, sometimes vomiting Iniitial Duration: Several hours to 2 days Initial Frequency: Usually 2-3 times a month Triggers/aggravating factors:  Menstrual cycle Relieving factors:  Sleep, medication Activity: Cannot function  Past abortive medication: Tylenol, sumatriptan NS, Zomig NS (effective but does not like nasal sprays), sumatriptan  100mg  tablet, Relpax, naproxen, diclofenac, caffeine or hydrocodone, rizatriptan, Zembrace SymTouch (effective but makes her drowsy), sumatriptan 100mg  tablet (ineffective) Past preventative medication: nortriptyline 10mg  (concerned about weight gain), metoprolol Other past therapy: magnesium citrate, coQ10, riboflavin  Family history of headache: No   PAST MEDICAL HISTORY: Past Medical History:  Diagnosis Date  . 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 Fibroids   . Headache(784.0) 06/14/2007   Dr. Tomi Likens seeing pt. for HA management   . High blood pressure   . HYPERTENSION 06/14/2007  . Hypothyroidism 2018   thyroidectomy   . Migraines   . Nodular goiter   . Obesity   . PONV (postoperative nausea and vomiting)   . Vitamin D deficiency     MEDICATIONS: Current Outpatient Medications on File Prior to Visit  Medication Sig Dispense Refill  . atenolol-chlorthalidone (TENORETIC) 50-25 MG tablet Take 1 tablet by mouth daily. 90 tablet 3  . cephALEXin (KEFLEX) 500 MG capsule Take 1 capsule (500 mg total) by mouth 2 (two) times daily. 14 capsule 0  . cholecalciferol (VITAMIN D) 1000 units tablet Take 1,000 Units by mouth daily.    . Coenzyme Q10 (COQ10) 100 MG CAPS Take 1 capsule by mouth daily.    Marland Kitchen ibuprofen (ADVIL,MOTRIN) 200 MG tablet Take 800 mg by mouth every 8 (eight) hours as needed (for headache).    Marland Kitchen levothyroxine (SYNTHROID) 137 MCG tablet Take 1 tablet (137 mcg total) by mouth daily before breakfast. 30 tablet 5  . metFORMIN (GLUCOPHAGE XR) 750 MG 24 hr tablet Take 1 tablet (750 mg total) by mouth daily with breakfast. 90 tablet 1  . promethazine (PHENERGAN) 12.5 MG tablet Take 1 tablet (12.5 mg total) by mouth every 8 (eight) hours as needed for nausea or vomiting. 30 tablet 3  .  topiramate (TOPAMAX) 50 MG tablet Take 1 tablet (50 mg total) by mouth daily. 30 tablet 3  . Topiramate ER (TROKENDI XR) 50 MG CP24 Take 50 mg by mouth at bedtime. 7 capsule 0  . TROKENDI XR 50 MG CP24  TAKE 1 CAPSULE BY MOUTH AT BEDTIME 30 capsule 3  . zolmitriptan (ZOMIG) 5 MG tablet Take 1 tablet (5 mg total) by mouth as needed for migraine. Take at the earliest onset of migraine; make repeat once in 2 hours if needed, not to exceed 2 tablets in 24 hours; do not take more than 2 times a week 10 tablet 3   No current facility-administered medications on file prior to visit.     ALLERGIES: Allergies  Allergen Reactions  . No Known Allergies     FAMILY HISTORY: Family History  Problem Relation Age of Onset  . Diabetes Mother   . Hypertension Mother   . Thyroid disease Mother    SOCIAL HISTORY: Social History   Socioeconomic History  . Marital status: Married    Spouse name: Not on file  . Number of children: 3  . Years of education: Not on file  . Highest education level: Not on file  Occupational History  . Occupation: Airline pilot: [employer]  . Financial resource strain: Not on file  . Food insecurity:    Worry: Not on file    Inability: Not on file  . Transportation needs:    Medical: Not on file    Non-medical: Not on file  Tobacco Use  . Smoking status: Never Smoker  . Smokeless tobacco: Never Used  Substance and Sexual Activity  . Alcohol use: No  . Drug use: No  . Sexual activity: Yes    Birth control/protection: None  Lifestyle  . Physical activity:    Days per week: Not on file    Minutes per session: Not on file  . Stress: Not on file  Relationships  . Social connections:    Talks on phone: Not on file    Gets together: Not on file    Attends religious service: Not on file    Active member of club or organization: Not on file    Attends meetings of clubs or organizations: Not on file    Relationship status: Not on file  . Intimate partner violence:    Fear of current or ex partner: Not on file    Emotionally abused: Not on file    Physically abused: Not on file    Forced sexual activity: Not on file  Other  Topics Concern  . Not on file  Social History Narrative  . Not on file    REVIEW OF SYSTEMS: Constitutional: No fevers, chills, or sweats, no generalized fatigue, change in appetite Eyes: No visual changes, double vision, eye pain Ear, nose and throat: No hearing loss, ear pain, nasal congestion, sore throat Cardiovascular: No chest pain, palpitations Respiratory:  No shortness of breath at rest or with exertion, wheezes GastrointestinaI: No nausea, vomiting, diarrhea, abdominal pain, fecal incontinence Genitourinary:  No dysuria, urinary retention or frequency Musculoskeletal:  Right sided neck tightness Integumentary: No rash, pruritus, skin lesions Neurological: as above Psychiatric: No depression, insomnia, anxiety Endocrine: No palpitations, fatigue, diaphoresis, mood swings, change in appetite, change in weight, increased thirst Hematologic/Lymphatic:  No purpura, petechiae. Allergic/Immunologic: no itchy/runny eyes, nasal congestion, recent allergic reactions, rashes  PHYSICAL EXAM: Blood pressure 110/80, pulse 76, height 5\' 4"  (1.626  m), weight 251 lb (113.9 kg), SpO2 96 %. General: No acute distress.  Patient appears well-groomed.   Head:  Normocephalic/atraumatic Eyes:  Fundi examined but not visualized Neck: supple, mild right sided tenderness, full range of motion Heart:  Regular rate and rhythm Lungs:  Clear to auscultation bilaterally Back: No paraspinal tenderness Neurological Exam: alert and oriented to person, place, and time. Attention span and concentration intact, recent and remote memory intact, fund of knowledge intact.  Speech fluent and not dysarthric, language intact.  CN II-XII intact. Bulk and tone normal, muscle strength 5/5 throughout.  Sensation to light touch  intact.  Deep tendon reflexes 2+ throughout.  Finger to nose testing intact.  Gait normal, Romberg negative.  IMPRESSION: Migraine without aura, without status migrainosus,  intractable  PLAN: 1.  Will give her a headache cocktail today (her daughter will drive her home).  If she has a headache tomorrow, she will fill prednisone taper.  She is not to take NSAIDs while on prednisone taper. 2.  Increase topiramate ER to 100mg  at bedtime 3.  Zomig 5mg  for abortive therapy. 4.  Limit all pain relievers to no more than 2 days out of week to prevent rebound headache 5.  Headache diary 6.  Follow up in 4 months  Metta Clines, DO  CC: Philis Fendt, PA-C

## 2018-06-17 NOTE — Patient Instructions (Signed)
Dental Pain -Take Ibuprofen 600mg  by mouth then Tylenol ES 500mg  (one tablet) three hours later.  Repeat Ibuprofen 400mg  every 6 hours.  Repeat Tylenol 500mg , 1 tablet every 6 hours.  Do not exceed take more than 2400mg  of Ibuprofen in an 24 hour period.  Do not exceed more than 3000mg  of Tylenol in a 24 hour period. -Warm saltwater gargles 3-4 times daily or as needed for pain, discomfort. -Follow up in the ER if you develop fever, chills, difficulty swallowing or worsening pain. -Follow up with Dr. Johnnette Litter in [place] for dental appointment, [phone].  Dental pain may be caused by many things, including:  Tooth decay (cavities or caries). Cavities expose the nerve of your tooth to air and hot or cold temperatures. This can cause pain or discomfort.  Abscess or infection. A dental abscess is a collection of infected pus from a bacterial infection in the inner part of the tooth (pulp). It usually occurs at the end of the tooth's root.  Injury.  An unknown reason (idiopathic).  Your pain may be mild or severe. It may only occur when:  You are chewing.  You are exposed to hot or cold temperature.  You are eating or drinking sugary foods or beverages, such as soda or candy.  Your pain may also be constant. Follow these instructions at home: Watch your dental pain for any changes. The following actions may help to lessen any discomfort that you are feeling:  Take medicines only as directed by your dentist.  If you were prescribed an antibiotic medicine, finish all of it even if you start to feel better.  Keep all follow-up visits as directed by your dentist. This is important.  Do not apply heat to the outside of your face.  Rinse your mouth or gargle with salt water if directed by your dentist. This helps with pain and swelling. ? You can make salt water by adding  tsp of salt to 1 cup of warm water.  Apply ice to the painful area of your face: ? Put ice in a plastic  bag. ? Place a towel between your skin and the bag. ? Leave the ice on for 20 minutes, 2-3 times per day.  Avoid foods or drinks that cause you pain, such as: ? Very hot or very cold foods or drinks. ? Sweet or sugary foods or drinks.  Contact a health care provider if:  Your pain is not controlled with medicines.  Your symptoms are worse.  You have new symptoms. Get help right away if:  You are unable to open your mouth.  You are having trouble breathing or swallowing.  You have a fever.  Your face, neck, or jaw is swollen. This information is not intended to replace advice given to you by your health care provider. Make sure you discuss any questions you have with your health care provider. Document Released: 10/06/2005 Document Revised: 02/14/2016 Document Reviewed: 10/02/2014 Elsevier Interactive Patient Education  [employer].

## 2018-06-17 NOTE — Progress Notes (Addendum)
History of Present Illness   Patient Identification [name] is a 52 y.o. female.  Patient information was obtained from patient. History/Exam limitations: none.  Chief Complaint  Oral Swelling (x 1 day, hurt, right side of her face swollen  )   Patient who presents with complaint of toothache. Onset of symptoms was gradual starting several days ago, but worsening pain and swelling x 1 day. Patient describes pain as aching. Pain severity now is 4 /10. The pain does not radiate. Patient has jaw swelling,but denies fever >101. Pain is aggravated by use and chewing, eating. Pain is alleviated by acetaminophen and NSAIDS. The patient denies other complaints. Patient has not sought treatment by another care provider for this problem. Care prior to arrival consisted of NSAID and acetaminophen, with minimal relief.  States she has also been having worsening migraines.  Patient states that she has been taking numerous medications to include Excedrin, ibuprofen, and Tylenol for not only her toothache but also for her headache.  Patient is scheduled to see her neurologist tomorrow for her headaches.  Past Medical History:  Diagnosis Date  . 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 Fibroids   . Headache(784.0) 06/14/2007   Dr. Tomi Likens seeing pt. for HA management   . High blood pressure   . HYPERTENSION 06/14/2007  . Hypothyroidism 2018   thyroidectomy   . Migraines   . Nodular goiter   . Obesity   . PONV (postoperative nausea and vomiting)   . Vitamin D deficiency    Family History  Problem Relation Age of Onset  . Diabetes Mother   . Hypertension Mother   . Thyroid disease Mother     Allergies  Allergen Reactions  . No Known Allergies    Social History   Socioeconomic History  . Marital status: Married    Spouse name: Not on file  . Number of children: 3  . Years of education: Not on file  . Highest education level: Not on file  Occupational History  . Occupation: Glass blower/designer: [employer]  . Financial resource strain: Not on file  . Food insecurity:    Worry: Not on file    Inability: Not on file  . Transportation needs:    Medical: Not on file    Non-medical: Not on file  Tobacco Use  . Smoking status: Never Smoker  . Smokeless tobacco: Never Used  Substance and Sexual Activity  . Alcohol use: No  . Drug use: No  . Sexual activity: Yes    Birth control/protection: None  Lifestyle  . Physical activity:    Days per week: Not on file    Minutes per session: Not on file  . Stress: Not on file  Relationships  . Social connections:    Talks on phone: Not on file    Gets together: Not on file    Attends religious service: Not on file    Active member of club or organization: Not on file    Attends meetings of clubs or organizations: Not on file    Relationship status: Not on file  . Intimate partner violence:    Fear of current or ex partner: Not on file    Emotionally abused: Not on file    Physically abused: Not on file    Forced sexual activity: Not on file  Other Topics Concern  . Not on file  Social History Narrative  . Not on file  Review of Systems Constitutional: negative Eyes: negative Ears, nose, mouth, throat, and face: positive for dental pain on right side of mouth, upper with right-sided facial swelling, negative for ear drainage, earaches, hoarseness, nasal congestion and sore throat Respiratory: negative Cardiovascular: negative Neurological: positive for headaches, negative for coordination problems, dizziness, seizures, speech problems, tremors, vertigo and weakness   Physical Exam   BP 120/82 (BP Location: Right Arm, Patient Position: Sitting, Cuff Size: Large)   Pulse 77   Temp 98.9 F (37.2 C) (Oral)   Resp 18   Wt 255 lb 3.2 oz (115.8 kg)   SpO2 97%   BMI 42.47 kg/m   Physical Exam  Constitutional: She is oriented to person, place, and time. She appears well-developed and  well-nourished. She appears distressed (due to dental pain and headache, rates 4/10 at present).  HENT:  Head: Normocephalic and atraumatic.  Right Ear: External ear normal.  Left Ear: External ear normal.  Nose: Nose normal.  Mouth/Throat: Oropharynx is clear and moist and mucous membranes are normal. Dental abscesses and dental caries present. No uvula swelling.    Swelling to right mandible, pain in upper second/third molar of right mouth. + dental caries  Eyes: Pupils are equal, round, and reactive to light. Conjunctivae are normal.  Neck: Normal range of motion. Neck supple. No thyromegaly present.  Cardiovascular: Normal rate and regular rhythm.  Pulmonary/Chest: Effort normal and breath sounds normal.  Neurological: She is alert and oriented to person, place, and time.  Skin: Skin is warm and dry.    Assessment and Plan  Dentalgia, Dental Abscess  Exam findings, diagnosis etiology and medication use and indications reviewed with patient. Follow- Up and discharge instructions provided. No emergent/urgent issues found on exam.  Was instructed to take ibuprofen and Tylenol as directed.  Patient instructions were provided.  Patient was instructed to use warm salt water gargles, and patient was instructed to follow-up in the ER if she develop fever chills difficulty swallowing worsening swelling or worsening pain.  Patient was given a referral for Dr. Johnnette Litter  in [facility] for dental appointment before next week. Patient verbalized understanding of information provided and agrees with plan of care (POC), all questions answered. The patient is advised to call or return to clinic if he does not see an improvement in symptoms, or to seek the care of the closest emergency department if he worsens with the above plan.   1. Dentalgia  - amoxicillin (AMOXIL) 500 MG capsule; Take 1 capsule (500 mg total) by mouth 3 (three) times daily for 10 days.  Dispense: 30 capsule; Refill: 0 -Take  Ibuprofen 600mg  by mouth now then Tylenol ES 500mg  (one tablet) three hours later.  Repeat Ibuprofen 400mg  every 6 hours.  Repeat Tylenol 500mg , 1 tablet every 6 hours.  Do not exceed take more than 2400mg  of Ibuprofen in a 24 hour period.  Do not exceed more than 3000mg  of Tylenol in a 24 hour period. -Warm saltwater gargles 3-4 times daily or as needed for pain, discomfort. -Warm compresses to right jaw for comfort. -Follow up in the ER if you develop fever, chills, difficulty swallowing or worsening pain. -Follow up with Dr. Johnnette Litter in [place] for dental appointment, [phone].

## 2018-06-18 ENCOUNTER — Encounter: Payer: Self-pay | Admitting: Neurology

## 2018-06-18 ENCOUNTER — Ambulatory Visit: Payer: 59 | Admitting: Neurology

## 2018-06-18 VITALS — BP 110/80 | HR 76 | Ht 64.0 in | Wt 251.0 lb

## 2018-06-18 DIAGNOSIS — G43019 Migraine without aura, intractable, without status migrainosus: Secondary | ICD-10-CM | POA: Diagnosis not present

## 2018-06-18 MED ORDER — TOPIRAMATE ER 100 MG PO CAP24: 100.0000 mg | ORAL_CAPSULE | Freq: Every day | ORAL | 0 refills | Status: DC

## 2018-06-18 MED ORDER — DIPHENHYDRAMINE HCL 50 MG/ML IJ SOLN
25.0000 mg | Freq: Once | INTRAMUSCULAR | Status: AC
  Administered 2018-06-18: 25 mg via INTRAMUSCULAR

## 2018-06-18 MED ORDER — METOCLOPRAMIDE HCL 5 MG/ML IJ SOLN
10.0000 mg | Freq: Once | INTRAVENOUS | Status: AC
  Administered 2018-06-18: 10 mg via INTRAMUSCULAR

## 2018-06-18 MED ORDER — KETOROLAC TROMETHAMINE 60 MG/2ML IM SOLN
60.0000 mg | Freq: Once | INTRAMUSCULAR | Status: AC
Start: 2018-06-18 — End: 2018-06-18
  Administered 2018-06-18: 60 mg via INTRAMUSCULAR

## 2018-06-18 MED ORDER — PREDNISONE 10 MG PO TABS: ORAL_TABLET | ORAL | 0 refills | Status: DC

## 2018-06-18 NOTE — Patient Instructions (Signed)
1.  We will increase Trokendi XR to 100mg  at bedtime. 2  We will give you a headache cocktail today.  Have your daughter drive you home. 3.  If headache persists tomorrow, then fill the prednisone taper 4.  Do not take ibuprofen while on prednisone taper, but you may take Zomig if needed. 5.  Follow up in 4 months.

## 2018-06-18 NOTE — Addendum Note (Signed)
Addended by: Clois Comber on: 06/18/2018 10:35 AM   Modules accepted: Orders

## 2018-06-18 NOTE — Addendum Note (Signed)
Addended by: Clois Comber on: 06/18/2018 11:14 AM   Modules accepted: Orders

## 2018-06-28 MED FILL — ZOLMitriptan 5 MG TABS: 5 | 30 days supply | Qty: 10 | Fill #3

## 2018-06-30 ENCOUNTER — Telehealth: Payer: Self-pay | Admitting: Neurology

## 2018-07-01 MED FILL — TROKENDI XR 100 MG CAPSULE: 100 | 30 days supply | Qty: 30 | Fill #0

## 2018-07-02 ENCOUNTER — Telehealth: Payer: Self-pay | Admitting: Neurology

## 2018-07-02 ENCOUNTER — Encounter (HOSPITAL_COMMUNITY): Payer: Self-pay

## 2018-07-02 ENCOUNTER — Ambulatory Visit (HOSPITAL_COMMUNITY)
Admission: EM | Admit: 2018-07-02 | Discharge: 2018-07-02 | Disposition: A | Discharge status: Home / Self Care | Payer: 59 | Attending: Family Medicine | Admitting: Family Medicine

## 2018-07-02 DIAGNOSIS — G43019 Migraine without aura, intractable, without status migrainosus: Secondary | ICD-10-CM

## 2018-07-02 MED ORDER — KETOROLAC TROMETHAMINE 60 MG/2ML IM SOLN
60.0000 mg | Freq: Once | INTRAMUSCULAR | Status: AC
  Administered 2018-07-02: 60 mg via INTRAMUSCULAR

## 2018-07-02 MED ORDER — DIPHENHYDRAMINE HCL 50 MG/ML IJ SOLN
25.0000 mg | Freq: Once | INTRAMUSCULAR | Status: AC
  Administered 2018-07-02: 25 mg via INTRAMUSCULAR

## 2018-07-02 MED ORDER — DIPHENHYDRAMINE HCL 50 MG/ML IJ SOLN
INTRAMUSCULAR | Status: AC
  Filled 2018-07-02: qty 1

## 2018-07-02 MED ORDER — DEXAMETHASONE SODIUM PHOSPHATE 10 MG/ML IJ SOLN
INTRAMUSCULAR | Status: AC
  Filled 2018-07-02: qty 1

## 2018-07-02 MED ORDER — KETOROLAC TROMETHAMINE 60 MG/2ML IM SOLN
INTRAMUSCULAR | Status: AC
  Filled 2018-07-02: qty 2

## 2018-07-02 MED ORDER — METOCLOPRAMIDE HCL 5 MG/ML IJ SOLN
5.0000 mg | Freq: Once | INTRAMUSCULAR | Status: AC
  Administered 2018-07-02: 5 mg via INTRAMUSCULAR

## 2018-07-02 MED ORDER — PENICILLIN G BENZATHINE 1200000 UNIT/2ML IM SUSP
INTRAMUSCULAR | Status: AC
  Filled 2018-07-02: qty 2

## 2018-07-02 MED ORDER — DEXAMETHASONE SODIUM PHOSPHATE 10 MG/ML IJ SOLN
10.0000 mg | Freq: Once | INTRAMUSCULAR | Status: AC
  Administered 2018-07-02: 10 mg via INTRAMUSCULAR

## 2018-07-02 MED ORDER — METOCLOPRAMIDE HCL 5 MG/ML IJ SOLN
INTRAMUSCULAR | Status: AC
  Filled 2018-07-02: qty 2

## 2018-07-02 NOTE — Telephone Encounter (Signed)
Patient is wanting to talk to someone today about her migraine

## 2018-07-02 NOTE — Discharge Instructions (Signed)
Rest is dark room without stimuli.  Please continue with previously prescribed medications for your migraine headaches.  No further antiinflammatories until tomorrow.  Please continue to follow with neurology for further treatment options.  If pain persists or worsens without any improvement please follow up in Ed>

## 2018-07-02 NOTE — ED Triage Notes (Signed)
Pt presents with ongoing migraine that has progressively got worse.

## 2018-07-02 NOTE — Telephone Encounter (Signed)
Patient called needing a headache Cocktail. Please Call. Thanks

## 2018-07-02 NOTE — ED Provider Notes (Signed)
[name]    CSN: [id number] Arrival date & time: 07/02/18  1442     History   Chief Complaint Chief Complaint  Patient presents with  . Migraine    HPI [name] is a 52 y.o. female.   [place] presents with complaints of persistent migraine. States sensation feels like similar migraines she suffers from, yet this has lasted longer than typical migraine for her. It has waxed and waned for the past 3 weeks. Saw her neurologist last 8/30 and was given reglan and toradol in clinic. Started a prednisone pack the following day, has completed this. Symptoms have intermittently improved, but always return. Pain 8/10 in severity. Unable to describe sensation, states it "hurts".  Has also been taking tylenol and zomig for pain which minimally helps. Uses phenergan for nausea, has not taken in the past two days as she has still been working and it makes her too drowsy. She feels fatigued, dizzy and sometimes with blurry vision. No head injury. No uri symptoms. Light sound and movement worsen her symptoms. Her neck feels sore due to it. Has not vomited since 9/9. States she has dealt with migraines for years. Her topiramate dose was also just increased. Denies any known CT or MRI in the past. No weakness, confusion or changes to speech. Hx of fibroids, htn, hypothyroidism.     ROS per HPI.      Past Medical History:  Diagnosis Date  . 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 Fibroids   . Headache(784.0) 06/14/2007   Dr. Tomi Likens seeing pt. for HA management   . High blood pressure   . HYPERTENSION 06/14/2007  . Hypothyroidism 2018   thyroidectomy   . Migraines   . Nodular goiter   . Obesity   . PONV (postoperative nausea and vomiting)   . Vitamin D deficiency     Patient Active Problem List   Diagnosis Date Noted  . Post-operative state 01/26/2018  . Acute cystitis without hematuria 10/24/2017  . Morbid obesity with BMI of 40.0-44.9, adult ([place]) 09/27/2017  . Menorrhagia  with regular cycle 08/19/2017  . 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 Depression 12/23/2016  . 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 Vitamin D deficiency 10/22/2016  . Prediabetes 10/22/2016  . Multinodular goiter 12/08/2014  . Hx of migraine headaches 12/08/2014  . Morbid obesity ([place]) 03/14/2010  . Essential hypertension 06/14/2007  . HEADACHE 06/14/2007    Past Surgical History:  Procedure Laterality Date  . ABDOMINAL HYSTERECTOMY    . [place]   x2, with epidural & general anesth.   [facility] Kitchen DILATION AND CURETTAGE OF UTERUS     x2  . HYSTERECTOMY ABDOMINAL WITH SALPINGO-OOPHORECTOMY N/A 01/26/2018   Procedure: SUPRACERVICAL HYSTERECTOMY ABDOMINAL WITH SALPINGO-OOPHORECTOMY;  Surgeon: [name], MD;  Location: [facility];  Service: Gynecology;  Laterality: N/A;  . LYSIS OF ADHESION N/A 01/26/2018   Procedure: EXTENSIVE LYSIS OF ADHESIONS;  Surgeon: [name], MD;  Location: [facility];  Service: Gynecology;  Laterality: N/A;  . THYROIDECTOMY  11/18/2016  . THYROIDECTOMY N/A 11/18/2016   Procedure: TOTAL THYROIDECTOMY;  Surgeon: Armandina Gemma, MD;  Location: [facility];  Service: General;  Laterality: N/A;  . THYROIDECTOMY      OB History    Gravida  3   Para      Term      Preterm      AB  1   Living  2     SAB  1  TAB      Ectopic      Multiple      Live Births               Home Medications    Prior to Admission medications   Medication Sig Start Date End Date Taking? Authorizing Provider  atenolol-chlorthalidone (TENORETIC) 50-25 MG tablet Take 1 tablet by mouth daily. 04/14/18   Tereasa Coop, PA-C  cephALEXin (KEFLEX) 500 MG capsule Take 1 capsule (500 mg total) by mouth 2 (two) times daily. Patient not taking: Reported on 06/17/2018 05/24/18   Evelina Dun A, FNP  cholecalciferol (VITAMIN D) 1000 units tablet Take 1,000 Units by mouth daily.    [provider]  Coenzyme Q10 (COQ10) 100 MG CAPS Take 1 capsule by mouth  daily.    [provider]  ibuprofen (ADVIL,MOTRIN) 200 MG tablet Take 800 mg by mouth every 8 (eight) hours as needed (for headache).    [provider]  levothyroxine (SYNTHROID) 137 MCG tablet Take 1 tablet (137 mcg total) by mouth daily before breakfast. 02/22/18   Elayne Snare, MD  metFORMIN (GLUCOPHAGE XR) 750 MG 24 hr tablet Take 1 tablet (750 mg total) by mouth daily with breakfast. 04/16/18   Tereasa Coop, PA-C  predniSONE (DELTASONE) 10 MG tablet Take 6tabs x1day, then 5tabs x1day, then 4tabs x1day, then 3tabs x1day, then 2tabs x1day, then 1tab x1day, then STOP 06/18/18   Jaffe, Adam R, DO  promethazine (PHENERGAN) 12.5 MG tablet Take 1 tablet (12.5 mg total) by mouth every 8 (eight) hours as needed for nausea or vomiting. Patient not taking: Reported on 06/17/2018 02/24/18   Pieter Partridge, DO  topiramate (TOPAMAX) 50 MG tablet Take 1 tablet (50 mg total) by mouth daily. Patient not taking: Reported on 06/18/2018 12/23/17   Pieter Partridge, DO  Topiramate ER (TROKENDI XR) 100 MG CP24 Take 100 mg by mouth at bedtime. 06/18/18   Pieter Partridge, DO  Topiramate ER (TROKENDI XR) 50 MG CP24 Take 50 mg by mouth at bedtime. Patient not taking: Reported on 06/18/2018 05/10/18   Pieter Partridge, DO  TROKENDI XR 50 MG CP24 TAKE 1 CAPSULE BY MOUTH AT BEDTIME Patient not taking: Reported on 06/17/2018 05/10/18   Pieter Partridge, DO  zolmitriptan (ZOMIG) 5 MG tablet Take 1 tablet (5 mg total) by mouth as needed for migraine. Take at the earliest onset of migraine; make repeat once in 2 hours if needed, not to exceed 2 tablets in 24 hours; do not take more than 2 times a week 02/24/18   Pieter Partridge, DO    Family History Family History  Problem Relation Age of Onset  . Diabetes Mother   . Hypertension Mother   . Thyroid disease Mother     Social History Social History   Tobacco Use  . Smoking status: Never Smoker  . Smokeless tobacco: Never Used  Substance Use Topics  . Alcohol use: No  .  Drug use: No     Allergies   No known allergies   Review of Systems Review of Systems   Physical Exam Triage Vital Signs ED Triage Vitals [07/02/18 1532]  Enc Vitals Group     BP      Pulse Rate 94     Resp 20     Temp 97.8 F (36.6 C)     Temp Source Oral     SpO2 95 %     Weight  Height      Head Circumference      Peak Flow      Pain Score      Pain Loc      Pain Edu?      Excl. in [place]?    No data found.  Updated Vital Signs Pulse 94   Temp 97.8 F (36.6 C) (Oral)   Resp 20   SpO2 95%    Physical Exam  Constitutional: She is oriented to person, place, and time. She appears well-developed and well-nourished. No distress.  Cardiovascular: Normal rate, regular rhythm and normal heart sounds.  Pulmonary/Chest: Effort normal and breath sounds normal.  Neurological: She is alert and oriented to person, place, and time. No cranial nerve deficit or sensory deficit. She exhibits normal muscle tone. Coordination normal.  Skin: Skin is warm and dry.     UC Treatments / Results  Labs (all labs ordered are listed, but only abnormal results are displayed) Labs Reviewed - No data to display  EKG None  Radiology No results found.  Procedures Procedures (including critical care time)  Medications Ordered in UC Medications  ketorolac (TORADOL) injection 60 mg (has no administration in time range)  metoCLOPramide (REGLAN) injection 5 mg (has no administration in time range)  dexamethasone (DECADRON) injection 10 mg (has no administration in time range)  diphenhydrAMINE (BENADRYL) injection 25 mg (has no administration in time range)    Initial Impression / Assessment and Plan / UC Course  I have reviewed the triage vital signs and the nursing notes.  Pertinent labs & imaging results that were available during my care of the patient were reviewed by me and considered in my medical decision making (see chart for details).     Without acute neurological  findings on exam. Non toxic in appearance, no distress noted. Persistent migraine, sensation similar to previous migraines. Discussed options here in urgent care. Opted to provide complete migraine cocktail here tonight, daughter driving patient home. Instruct to go home and sleep in low stimuli environment. To continue to follow up with pcp and/or neurology. Discussed going to er if any worsening symptoms. Patient verbalized understanding and agreeable to plan.  Ambulatory out of clinic without difficulty.    Final Clinical Impressions(s) / UC Diagnoses   Final diagnoses:  Intractable migraine without aura and without status migrainosus     Discharge Instructions     Rest is dark room without stimuli.  Please continue with previously prescribed medications for your migraine headaches.  No further antiinflammatories until tomorrow.  Please continue to follow with neurology for further treatment options.  If pain persists or worsens without any improvement please follow up in Ed>    ED Prescriptions    None     Controlled Substance Prescriptions [place] Controlled Substance Registry consulted? Not Applicable   Zigmund Gottron, NP 07/02/18 [phone]

## 2018-07-03 ENCOUNTER — Emergency Department (HOSPITAL_COMMUNITY): Admission: EM | Admit: 2018-07-03 | Discharge: 2018-07-03 | Discharge status: Absent without leave | Payer: 59

## 2018-07-03 ENCOUNTER — Other Ambulatory Visit: Payer: Self-pay

## 2018-07-07 ENCOUNTER — Telehealth: Payer: Self-pay | Admitting: Neurology

## 2018-07-07 NOTE — Telephone Encounter (Signed)
Patient called on 9/16 requesting to speak with the Office Manager regarding a complaint. I called patient back and had to leave a voicemail message. I called her back on 07/06/18 at 2:26pm and spoke to patient. Patient verbalized that she had concerns with Dr. Georgie Chard CMA Mikey Bussing. She is stating that her Trokendi was never faxed to the pharmacy and that she had to call the pharmacy to find out that it was never called in. The Rx required at prior authorization however patient is stating that even after the PA was obtained that the RX was not called in. She called twice on 9/13 due to a migraine headache and wanted to get an IV cocktail to treat the migraine since this was recommended by Dr. Tomi Likens if the other treatment did not help her. She stated that [facility] never returned her call and that she finally went to [facility] urgent care at the end of the day because she was in so much pain and she had not received a call back from our office. She stated that while at urgent care that she had received a call from our office per her caller id however she did not answer at the time because it was after office hours and she was already at urgent care seeking help. She states that her FMLA papers were not completed correctly and did not cover the amount of time she was out of work for her migraine. She is going to bring the FMLA paperwork back to the office so that they can be updated. I listened to the patient, verbalizes with her that [place] had worked all-day off and on trying to get infusion set-up for her at [facility] short stay however she was unsuccessful in doing so due to the backlog of cases that they already had on the books. I also informed her in the future that she can reach out to me if she needs further assistance.

## 2018-07-14 MED ORDER — TOPIRAMATE ER 100 MG PO CAP24: 100.0000 mg | ORAL_CAPSULE | Freq: Every day | ORAL | 5 refills | Status: DC

## 2018-07-14 NOTE — Progress Notes (Signed)
Called to inquire about PA for Trokendi 100 mg [phone], spoke with [place]. Prior PA expired. Gave clinicals, will receive determination within 72 hours

## 2018-07-15 NOTE — Progress Notes (Signed)
Rcvd approval PA reference [id number] Valid 07/01/18 - 07/01/19. Called [facility] outpatient pharmacy and LMOVM advising PA rcvd.

## 2018-07-16 ENCOUNTER — Ambulatory Visit: Payer: 59 | Admitting: Physician Assistant

## 2018-07-16 ENCOUNTER — Telehealth: Payer: Self-pay | Admitting: Neurology

## 2018-07-16 NOTE — Telephone Encounter (Signed)
Called and LMOVM advising Pt FMLA paperwork has been faxed and the original is at the front desk for pick up

## 2018-07-16 NOTE — Telephone Encounter (Signed)
Patient called and left a voicemail wanting to check the status of her recent FMLA paperwork. Please call her back at [phone]. Thanks!

## 2018-07-23 ENCOUNTER — Ambulatory Visit: Payer: 59 | Admitting: Neurology

## 2018-08-20 MED FILL — TROKENDI XR 100 MG CAPSULE: 100 | 30 days supply | Qty: 30 | Fill #1

## 2018-08-20 MED FILL — ATENOLOL/CHLORTHAL 50/25: 50-25 | 90 days supply | Qty: 90 | Fill #1

## 2018-08-20 MED FILL — LEVOTHYROXINE 137 MCG TABLE: 137 | 30 days supply | Qty: 30 | Fill #5

## 2018-08-24 ENCOUNTER — Other Ambulatory Visit: Payer: 59

## 2018-08-27 ENCOUNTER — Ambulatory Visit: Payer: 59 | Admitting: Endocrinology

## 2018-09-23 ENCOUNTER — Telehealth: Payer: Self-pay | Admitting: Endocrinology

## 2018-09-23 NOTE — Telephone Encounter (Signed)
MEDICATION: levothyroxine (SYNTHROID) 137 MCG tablet  PHARMACY:  Contacted. [facility]  IS THIS A 90 DAY SUPPLY : Yes   IS PATIENT OUT OF MEDICATION:   IF NOT; HOW MUCH IS LEFT: 4 pills  LAST APPOINTMENT DATE: @Visit  date not found *SCHEDULED* NEXT APPOINTMENT DATE:@1 /14/2020  DO WE HAVE YOUR PERMISSION TO LEAVE A DETAILED MESSAGE: Yes [phone]  OTHER COMMENTS:    **Let patient know to contact pharmacy at the end of the day to make sure medication is ready. **  ** Please notify patient to allow 48-72 hours to process**  **Encourage patient to contact the pharmacy for refills or they can request refills through [facility]**

## 2018-09-24 ENCOUNTER — Other Ambulatory Visit: Payer: Self-pay

## 2018-09-24 MED ORDER — LEVOTHYROXINE SODIUM 137 MCG PO TABS: 137.0000 ug | ORAL_TABLET | Freq: Every day | ORAL | 0 refills | Status: DC

## 2018-09-24 MED FILL — LEVOTHYROXINE 137 MCG TABLE: 137 | 90 days supply | Qty: 90 | Fill #0

## 2018-09-24 NOTE — Telephone Encounter (Signed)
Done

## 2018-09-30 ENCOUNTER — Encounter: Payer: Self-pay | Admitting: Family Medicine

## 2018-09-30 ENCOUNTER — Ambulatory Visit (INDEPENDENT_AMBULATORY_CARE_PROVIDER_SITE_OTHER): Payer: Self-pay | Admitting: Family Medicine

## 2018-09-30 DIAGNOSIS — R05 Cough: Secondary | ICD-10-CM

## 2018-09-30 DIAGNOSIS — R059 Cough, unspecified: Secondary | ICD-10-CM

## 2018-09-30 DIAGNOSIS — J40 Bronchitis, not specified as acute or chronic: Secondary | ICD-10-CM

## 2018-09-30 MED ORDER — ALBUTEROL SULFATE HFA 108 (90 BASE) MCG/ACT IN AERS: 2.0000 | INHALATION_SPRAY | Freq: Four times a day (QID) | RESPIRATORY_TRACT | 0 refills | Status: DC | PRN

## 2018-09-30 MED ORDER — PREDNISONE 20 MG PO TABS: 40.0000 mg | ORAL_TABLET | Freq: Every day | ORAL | 0 refills | Status: AC

## 2018-09-30 MED ORDER — BENZONATATE 200 MG PO CAPS: 200.0000 mg | ORAL_CAPSULE | Freq: Two times a day (BID) | ORAL | 0 refills | Status: DC | PRN

## 2018-09-30 MED FILL — VENTOLIN HFA 90 MCG INHALER: 108 (90 BAS | 25 days supply | Qty: 18 | Fill #0

## 2018-09-30 MED FILL — predniSONE 20 MG TABS: 20 | 5 days supply | Qty: 10 | Fill #0

## 2018-09-30 MED FILL — BENZONATATE 200 MG CAPS: 200 | 10 days supply | Qty: 20 | Fill #0

## 2018-09-30 NOTE — Patient Instructions (Signed)
Acute Bronchitis, Adult  PLAN< Increase hydration Mucinex- DM over the counter Tylenol/motrin every 8 hours for the next 2-3 days Call on 12/16 if symptoms unimproved  Acute bronchitis is when air tubes (bronchi) in the lungs suddenly get swollen. The condition can make it hard to breathe. It can also cause these symptoms:  A cough.  Coughing up clear, yellow, or green mucus.  Wheezing.  Chest congestion.  Shortness of breath.  A fever.  Body aches.  Chills.  A sore throat.  Follow these instructions at home: Medicines  Take over-the-counter and prescription medicines only as told by your doctor.  If you were prescribed an antibiotic medicine, take it as told by your doctor. Do not stop taking the antibiotic even if you start to feel better. General instructions  Rest.  Drink enough fluids to keep your pee (urine) clear or pale yellow.  Avoid smoking and secondhand smoke. If you smoke and you need help quitting, ask your doctor. Quitting will help your lungs heal faster.  Use an inhaler, cool mist vaporizer, or humidifier as told by your doctor.  Keep all follow-up visits as told by your doctor. This is important. How is this prevented? To lower your risk of getting this condition again:  Wash your hands often with soap and water. If you cannot use soap and water, use hand sanitizer.  Avoid contact with people who have cold symptoms.  Try not to touch your hands to your mouth, nose, or eyes.  Make sure to get the flu shot every year.  Contact a doctor if:  Your symptoms do not get better in 2 weeks. Get help right away if:  You cough up blood.  You have chest pain.  You have very bad shortness of breath.  You become dehydrated.  You faint (pass out) or keep feeling like you are going to pass out.  You keep throwing up (vomiting).  You have a very bad headache.  Your fever or chills gets worse. This information is not intended to replace  advice given to you by your health care provider. Make sure you discuss any questions you have with your health care provider. Document Released: 03/24/2008 Document Revised: 05/14/2016 Document Reviewed: 03/26/2016 Elsevier Interactive Patient Education  [employer].

## 2018-09-30 NOTE — Progress Notes (Signed)
[name] is a 52 y.o. female who presents today with concerns of cough and congestion for the last week with symptoms beginning last Friday and continued to worsen and persisted. Cough is primary symptom and patient  reports that when she coughs she will occasionally bring up mucus that is now green and yellow. She denies any history of asthma or chronic health conditions.  Review of Systems  Constitutional: Positive for fever and malaise/fatigue. Negative for chills.  HENT: Positive for congestion. Negative for ear discharge, ear pain, sinus pain and sore throat.   Eyes: Negative.   Respiratory: Positive for cough and sputum production. Negative for shortness of breath.   Cardiovascular: Negative.  Negative for chest pain.  Gastrointestinal: Negative for abdominal pain, diarrhea, nausea and vomiting.  Genitourinary: Negative for dysuria, frequency, hematuria and urgency.  Musculoskeletal: Negative for myalgias.  Skin: Negative.   Neurological: Negative for headaches.  Endo/Heme/Allergies: Negative.   Psychiatric/Behavioral: Negative.     O: Vitals:   09/30/18 1624  BP: 130/84  Pulse: (!) 102  Temp: 99.1 F (37.3 C)  SpO2: 97%     Physical Exam Vitals signs reviewed.  Constitutional:      Appearance: She is well-developed. She is not toxic-appearing.  HENT:     Head: Normocephalic.     Right Ear: Hearing, tympanic membrane, ear canal and external ear normal.     Left Ear: Hearing, tympanic membrane, ear canal and external ear normal.     Nose: Nose normal.     Mouth/Throat:     Pharynx: Uvula midline.  Neck:     Musculoskeletal: Normal range of motion and neck supple.  Cardiovascular:     Rate and Rhythm: Regular rhythm. Tachycardia present.     Pulses: Normal pulses.     Heart sounds: Normal heart sounds.  Pulmonary:     Effort: Pulmonary effort is normal.     Breath sounds: Examination of the right-upper field reveals wheezing and rhonchi. Examination of the  left-upper field reveals wheezing and rhonchi. Examination of the right-middle field reveals rhonchi. Examination of the left-middle field reveals rhonchi. Examination of the right-lower field reveals rhonchi. Examination of the left-lower field reveals rhonchi. Wheezing and rhonchi present. No decreased breath sounds.  Abdominal:     General: Bowel sounds are normal.     Palpations: Abdomen is soft.  Musculoskeletal: Normal range of motion.  Lymphadenopathy:     Head:     Right side of head: No submental or submandibular adenopathy.     Left side of head: No submental or submandibular adenopathy.     Cervical: No cervical adenopathy.  Neurological:     Mental Status: She is alert and oriented to person, place, and time.    A: 1. Bronchitis   2. Cough    P: Discussed exam findings, diagnosis etiology and medication use and indications reviewed with patient. Follow- Up and discharge instructions provided. No emergent/urgent issues found on exam.  Patient verbalized understanding of information provided and agrees with plan of care (POC), all questions answered. Considered flu but patient out of the treatment window.  PLAN< Increase hydration Mucinex- DM over the counter Tylenol/motrin every 8 hours for the next 2-3 days Call on 12/16 if symptoms unimproved   1. Bronchitis - albuterol (PROVENTIL HFA;VENTOLIN HFA) 108 (90 Base) MCG/ACT inhaler; Inhale 2 puffs into the lungs every 6 (six) hours as needed for wheezing or shortness of breath. - predniSONE (DELTASONE) 20 MG tablet; Take 2 tablets (40  mg total) by mouth daily with breakfast for 5 days.  2. Cough - benzonatate (TESSALON) 200 MG capsule; Take 1 capsule (200 mg total) by mouth 2 (two) times daily as needed for cough.  Discussed that would consider antibiotic use if symptoms not improved.

## 2018-10-04 ENCOUNTER — Telehealth: Payer: Self-pay | Admitting: Family Medicine

## 2018-10-04 DIAGNOSIS — R05 Cough: Secondary | ICD-10-CM

## 2018-10-04 DIAGNOSIS — J40 Bronchitis, not specified as acute or chronic: Secondary | ICD-10-CM

## 2018-10-04 DIAGNOSIS — R059 Cough, unspecified: Secondary | ICD-10-CM

## 2018-10-04 MED ORDER — AMOXICILLIN-POT CLAVULANATE 875-125 MG PO TABS: 1.0000 | ORAL_TABLET | Freq: Two times a day (BID) | ORAL | 0 refills | Status: AC

## 2018-10-04 MED ORDER — PROMETHAZINE-DM 6.25-15 MG/5ML PO SYRP: 5.0000 mL | ORAL_SOLUTION | Freq: Three times a day (TID) | ORAL | 0 refills | Status: DC | PRN

## 2018-10-04 MED FILL — AMOX-CLAV 875-125 MG TABLET: 875-125 | 7 days supply | Qty: 14 | Fill #0

## 2018-10-04 MED FILL — PROMETHAZINE W/DM SYRUP: 6.25-15 | 8 days supply | Qty: 118 | Fill #0

## 2018-10-04 NOTE — Telephone Encounter (Signed)
Patient seen last week for bronchitis reports no improvement in symptoms and is requesting abx discussed at last visit if not improvement.

## 2018-10-18 ENCOUNTER — Other Ambulatory Visit: Payer: Self-pay | Admitting: Neurology

## 2018-10-18 MED FILL — TROKENDI XR 100 MG CAPSULE: 100 | 30 days supply | Qty: 30 | Fill #2

## 2018-10-18 MED FILL — ZOLMitriptan 5 MG TABS: 5 | 30 days supply | Qty: 10 | Fill #0

## 2018-10-21 NOTE — Progress Notes (Signed)
NEUROLOGY FOLLOW UP OFFICE NOTE  [name] [id number]  HISTORY OF PRESENT ILLNESS: [name] is a 53 year old left-handed female with hypertension and acquired hypothyroidism who follows up for migraines.  UPDATE: Intensity:  Moderate to severe Duration:  With Zomig and Tylenol or Aleve Frequency:  2 days a week Frequency of abortive medication: 2 days a week Over the past 2 weeks, she has been waking up with the migraines.   Current NSAIDS: Aleve Current analgesics: Tylenol Current triptans: Zomig 5 mg tablet Current ergotamine: None Current anti-emetic: Promethazine 12.5 mg Current muscle relaxants: None Current anti-anxiolytic: None Current sleep aide: None Current Antihypertensive medications: Atenolol- chlorthalidone Current Antidepressant medications: None Current Anticonvulsant medications: Topiramate ER 100 mg Current anti-CGRP: None Current Vitamins/Herbal/Supplements: None Current Antihistamines/Decongestants: None Other therapy: Caffeine for abortive therapy Hormone/birth control: None  Caffeine: Coffee usually only for headache treatment Alcohol: None Smoker: None Diet: Sees nutritionist.  Keeps hydrated Exercise: No Depression: No; Anxiety: Mild Other pain: No Sleep hygiene: Okay  HISTORY:  Onset: She has had migraines since age 53, however they have steadily gotten worse over the past year and have been daily since last week. Location:  Varies(either side, holocephalic).Over the past week, she has had left sided neck pain Quality:pounding Initial Intensity:10/10; December: 4/10 mild, 10/10 severe Aura:no Prodrome:no Associated symptoms: Nausea, photophobia, sometimes vomiting Iniitial Duration:Several hours to 2 days Initial Frequency:Usually 2-3 times a month Triggers: Menstrual cycle Relieving factors:  Sleep, medication Activity:Cannot function  Past abortive medication:Tylenol, sumatriptan NS, Zomig NS  (effective but does not like nasal sprays), sumatriptan 100mg  tablet, Relpax, naproxen, diclofenac, caffeine or hydrocodone, rizatriptan, Zembrace SymTouch (effective but makes her drowsy), sumatriptan 100mg  tablet (ineffective) Past preventative medication:nortriptyline 10mg  (concerned about weight gain), metoprolol Other past therapy:magnesium citrate, coQ10, riboflavin  Family history of headache:No  PAST MEDICAL HISTORY: Past Medical History:  Diagnosis Date  . 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 Fibroids   . Headache(784.0) 06/14/2007   Dr. Tomi Likens seeing pt. for HA management   . High blood pressure   . HYPERTENSION 06/14/2007  . Hypothyroidism 2018   thyroidectomy   . Migraines   . Nodular goiter   . Obesity   . PONV (postoperative nausea and vomiting)   . Vitamin D deficiency     MEDICATIONS: Current Outpatient Medications on File Prior to Visit  Medication Sig Dispense Refill  . albuterol (PROVENTIL HFA;VENTOLIN HFA) 108 (90 Base) MCG/ACT inhaler Inhale 2 puffs into the lungs every 6 (six) hours as needed for wheezing or shortness of breath. 1 Inhaler 0  . atenolol-chlorthalidone (TENORETIC) 50-25 MG tablet Take 1 tablet by mouth daily. 90 tablet 3  . benzonatate (TESSALON) 200 MG capsule Take 1 capsule (200 mg total) by mouth 2 (two) times daily as needed for cough. 20 capsule 0  . cephALEXin (KEFLEX) 500 MG capsule Take 1 capsule (500 mg total) by mouth 2 (two) times daily. (Patient not taking: Reported on 06/17/2018) 14 capsule 0  . cholecalciferol (VITAMIN D) 1000 units tablet Take 1,000 Units by mouth daily.    . Coenzyme Q10 (COQ10) 100 MG CAPS Take 1 capsule by mouth daily.    Marland Kitchen ibuprofen (ADVIL,MOTRIN) 200 MG tablet Take 800 mg by mouth every 8 (eight) hours as needed (for headache).    Marland Kitchen levothyroxine (SYNTHROID) 137 MCG tablet Take 1 tablet (137 mcg total) by mouth daily before breakfast. 90 tablet 0  . metFORMIN (GLUCOPHAGE XR) 750 MG 24 hr tablet Take 1 tablet  (750 mg total) by  mouth daily with breakfast. 90 tablet 1  . promethazine (PHENERGAN) 12.5 MG tablet Take 1 tablet (12.5 mg total) by mouth every 8 (eight) hours as needed for nausea or vomiting. (Patient not taking: Reported on 06/17/2018) 30 tablet 3  . promethazine-dextromethorphan (PROMETHAZINE-DM) 6.25-15 MG/5ML syrup Take 5 mLs by mouth 3 (three) times daily as needed for cough (may cause drowsiness). 118 mL 0  . topiramate (TOPAMAX) 50 MG tablet Take 1 tablet (50 mg total) by mouth daily. (Patient not taking: Reported on 06/18/2018) 30 tablet 3  . Topiramate ER (TROKENDI XR) 100 MG CP24 Take 100 mg by mouth at bedtime. 30 capsule 0  . Topiramate ER (TROKENDI XR) 100 MG CP24 Take 100 mg by mouth at bedtime. 30 capsule 5  . Topiramate ER (TROKENDI XR) 50 MG CP24 Take 50 mg by mouth at bedtime. (Patient not taking: Reported on 06/18/2018) 7 capsule 0  . TROKENDI XR 50 MG CP24 TAKE 1 CAPSULE BY MOUTH AT BEDTIME (Patient not taking: Reported on 06/17/2018) 30 capsule 3  . zolmitriptan (ZOMIG) 5 MG tablet TAKE 1 TAB BY MOUTH AT THE EARLIEST ONSET OF MIGRAINE; MAY REPEAT ONCE IN 2 HRS IF NEEED, NOT TO EXCEED 2 TABS IN 24 HRS, NO MORE THAN 2 TIM 10 tablet 3   No current facility-administered medications on file prior to visit.     ALLERGIES: Allergies  Allergen Reactions  . No Known Allergies     FAMILY HISTORY: Family History  Problem Relation Age of Onset  . Diabetes Mother   . Hypertension Mother   . Thyroid disease Mother    SOCIAL HISTORY: Social History   Socioeconomic History  . Marital status: Married    Spouse name: Not on file  . Number of children: 3  . Years of education: Not on file  . Highest education level: Not on file  Occupational History  . Occupation: Airline pilot: [employer]  . Financial resource strain: Not on file  . Food insecurity:    Worry: Not on file    Inability: Not on file  . Transportation needs:    Medical: Not  on file    Non-medical: Not on file  Tobacco Use  . Smoking status: Never Smoker  . Smokeless tobacco: Never Used  Substance and Sexual Activity  . Alcohol use: No  . Drug use: No  . Sexual activity: Yes    Birth control/protection: None  Lifestyle  . Physical activity:    Days per week: Not on file    Minutes per session: Not on file  . Stress: Not on file  Relationships  . Social connections:    Talks on phone: Not on file    Gets together: Not on file    Attends religious service: Not on file    Active member of club or organization: Not on file    Attends meetings of clubs or organizations: Not on file    Relationship status: Not on file  . Intimate partner violence:    Fear of current or ex partner: Not on file    Emotionally abused: Not on file    Physically abused: Not on file    Forced sexual activity: Not on file  Other Topics Concern  . Not on file  Social History Narrative  . Not on file    REVIEW OF SYSTEMS: Constitutional: No fevers, chills, or sweats, no generalized fatigue, change in appetite Eyes: No visual changes, double  vision, eye pain Ear, nose and throat: No hearing loss, ear pain, nasal congestion, sore throat Cardiovascular: No chest pain, palpitations Respiratory:  No shortness of breath at rest or with exertion, wheezes GastrointestinaI: No nausea, vomiting, diarrhea, abdominal pain, fecal incontinence Genitourinary:  No dysuria, urinary retention or frequency Musculoskeletal:  No neck pain, back pain Integumentary: No rash, pruritus, skin lesions Neurological: as above Psychiatric: No depression, insomnia, anxiety Endocrine: No palpitations, fatigue, diaphoresis, mood swings, change in appetite, change in weight, increased thirst Hematologic/Lymphatic:  No purpura, petechiae. Allergic/Immunologic: no itchy/runny eyes, nasal congestion, recent allergic reactions, rashes  PHYSICAL EXAM: Blood pressure 126/84, pulse (!) 107, height 5\' 5"   (1.651 m), weight 243 lb (110.2 kg), SpO2 97 %. General: No acute distress.  Patient appears well-groomed.  Head:  Normocephalic/atraumatic Eyes:  Fundi examined but not visualized Neck: supple, no paraspinal tenderness, full range of motion Heart:  Regular rate and rhythm Lungs:  Clear to auscultation bilaterally Back: No paraspinal tenderness Neurological Exam: alert and oriented to person, place, and time. Attention span and concentration intact, recent and remote memory intact, fund of knowledge intact.  Speech fluent and not dysarthric, language intact.  CN II-XII intact. Bulk and tone normal, muscle strength 5/5 throughout.  Sensation to light touch intact.  Deep tendon reflexes 2+ throughout, toes downgoing.  Finger to nose and heel to shin testing intact.  Gait normal, Romberg negative.  IMPRESSION: Migraine without aura, without status migrainosus, not intractable  PLAN: 1.  For preventative management, we will start Aimovig 70mg  monthly.  She will continue topiramate ER 100mg  at bedtime for now.  If headaches become well controlled, we will try tapering off topiramate. 2.  For abortive therapy, Zomig with or without Aleve or Tylenol. 3.  Limit use of pain relievers to no more than 2 days out of week to prevent risk of rebound or medication-overuse headache. 4.  Keep headache diary 5.  Exercise, hydration, caffeine cessation, sleep hygiene, monitor for and avoid triggers 6.  Consider:  magnesium citrate 400mg  daily, riboflavin 400mg  daily, and coenzyme Q10 100mg  three times daily 7.  Follow up in 4 months.  Metta Clines, DO

## 2018-10-22 ENCOUNTER — Ambulatory Visit: Payer: 59 | Admitting: Neurology

## 2018-10-22 ENCOUNTER — Encounter: Payer: Self-pay | Admitting: Neurology

## 2018-10-22 VITALS — BP 126/84 | HR 107 | Ht 65.0 in | Wt 243.0 lb

## 2018-10-22 DIAGNOSIS — G43009 Migraine without aura, not intractable, without status migrainosus: Secondary | ICD-10-CM

## 2018-10-22 MED ORDER — ERENUMAB-AOOE 70 MG/ML ~~LOC~~ SOAJ: 70.0000 mg | Freq: Once | SUBCUTANEOUS | 0 refills | Status: AC

## 2018-10-22 MED ORDER — ERENUMAB-AOOE 70 MG/ML ~~LOC~~ SOAJ: 70.0000 mg | SUBCUTANEOUS | 11 refills | Status: DC

## 2018-10-22 NOTE — Patient Instructions (Signed)
1.  We will start you on Aimovig 70mg  monthly 2.  Continue topiramate ER 100mg  at bedtime for now. 3.  Use Zomig with Aleve or Tylenol as needed but limit use of pain relievers to no more than 2 days out of week to prevent risk of rebound or medication-overuse headache. 4.  Keep headache diary 5.  Follow up in 4 months

## 2018-10-26 NOTE — Progress Notes (Signed)
Prior Authorization initiated via [email or web address] for pt's   Aimovig 70mg /mL

## 2018-10-28 NOTE — Progress Notes (Signed)
Dr. Delice Lesch signed for Dr. Tomi Likens - form faxed to [employer] at [phone]

## 2018-10-28 NOTE — Progress Notes (Signed)
Received fax with additional clinic question from [employer] in regards to pt Aimovig.  Questions answered, awaiting physician signature.

## 2018-10-29 NOTE — Progress Notes (Signed)
Received notice from [employer] that pt's   Aimovig has been denied for the following reason:  a previous trial of at least ONE of the following preventative migraine treatments: Valproic acid/divalproex sodium, topiramate, propranolol, timolol, amitriptyline, venlafaxine, atenolol, nadolol.  There is documentation that pt is currently taking Topiramate - but that is not a failure.

## 2018-11-02 ENCOUNTER — Other Ambulatory Visit (INDEPENDENT_AMBULATORY_CARE_PROVIDER_SITE_OTHER): Payer: 59

## 2018-11-02 ENCOUNTER — Other Ambulatory Visit: Payer: Self-pay | Admitting: Endocrinology

## 2018-11-02 DIAGNOSIS — E890 Postprocedural hypothyroidism: Secondary | ICD-10-CM | POA: Diagnosis not present

## 2018-11-02 LAB — T4, FREE: Free T4: 1.26 ng/dL (ref 0.60–1.60)

## 2018-11-02 LAB — TSH: TSH: 0.05 u[IU]/mL — ABNORMAL LOW (ref 0.35–4.50)

## 2018-11-02 NOTE — Progress Notes (Signed)
Rcvd fax from [place], [place] approved for 6 fills 10/28/18 - 04/27/19  Must experience a reduction in headache frequency of a least 2 days per month OR a reduction in severity or duration for renewal

## 2018-11-04 MED FILL — AIMOVIG 70 MG/ML SOAJ: 70 | 30 days supply | Qty: 1 | Fill #0

## 2018-11-05 ENCOUNTER — Ambulatory Visit: Payer: 59 | Admitting: Endocrinology

## 2018-11-05 ENCOUNTER — Encounter: Payer: Self-pay | Admitting: Endocrinology

## 2018-11-05 VITALS — BP 130/76 | HR 88 | Ht 65.0 in | Wt 244.4 lb

## 2018-11-05 DIAGNOSIS — E890 Postprocedural hypothyroidism: Secondary | ICD-10-CM

## 2018-11-05 DIAGNOSIS — R062 Wheezing: Secondary | ICD-10-CM | POA: Diagnosis not present

## 2018-11-05 DIAGNOSIS — Z131 Encounter for screening for diabetes mellitus: Secondary | ICD-10-CM

## 2018-11-05 MED ORDER — ALBUTEROL SULFATE HFA 108 (90 BASE) MCG/ACT IN AERS: 2.0000 | INHALATION_SPRAY | Freq: Three times a day (TID) | RESPIRATORY_TRACT | 0 refills | Status: DC | PRN

## 2018-11-05 MED ORDER — LEVOTHYROXINE SODIUM 125 MCG PO TABS: 125.0000 ug | ORAL_TABLET | Freq: Every day | ORAL | 1 refills | Status: DC

## 2018-11-05 MED FILL — VENTOLIN HFA 90 MCG INHALER: 108 (90 BAS | 17 days supply | Qty: 18 | Fill #0

## 2018-11-05 MED FILL — LEVOTHYROXINE 125 MCG TAB: 125 | 90 days supply | Qty: 90 | Fill #0

## 2018-11-05 NOTE — Progress Notes (Signed)
Patient ID: [name], female   DOB: [birth year], 53 y.o.   MRN: [id number]           Reason for Appointment: Follow-up of low thyroid    History of Present Illness:   She had a total THYROIDECTOMY in 11/18/16 done for her multinodular goiter with symptoms of pressure and choking sensation in her neck and some difficulty with swallowing She was seen in follow-up after her surgery and her TSH was 11.7, at that time she was taking 88 g of levothyroxine Also was feeling significantly fatigued  RECENT history: She has been taking 137 g of levothyroxine since 09/2017 Previously with 112 g of levothyroxine her TSH had gone up 11.7 At that time she was not having any specific symptoms  He feels fairly good and has no complaints of fatigue Also no shakiness or palpitations, no nervousness  She takes her levothyroxine consistently before breakfast with water She has been taking her levothyroxine 137 mcg daily, getting prescription from the hospital pharmacy  Her TSH is unusually low at 0.05 without much change in her free T4  Lab Results  Component Value Date   TSH 0.05 (L) 11/02/2018   TSH 1.44 02/19/2018   TSH 1.46 11/19/2017   FREET4 1.26 11/02/2018   FREET4 1.31 04/14/2018   FREET4 0.90 02/19/2018    Previous history: The patient's thyroid enlargement was first discovered in 03/2014 incidentally when the patient was at the urgent care center Her ultrasound showed a multinodular goiter  Since she had an increased size of the isthmus nodule this was evaluated with needle aspiration in 07/2016 and showed benign follicular adenoma  She  had an ultrasound exam in 10/16 showed multiple nodules with dominant solid nodule in the left isthmus measuring 2.8 cm, previously 2.6.    Other nodules were the same or minimally changed   OBESITY:  Has difficulty losing weight     Wt Readings from Last 3 Encounters:  11/05/18 244 lb 6.4 oz (110.9 kg)  10/22/18 243 lb (110.2 kg)    09/30/18 242 lb 9.6 oz (110 kg)    Allergies as of 11/05/2018      Reactions   No Known Allergies       Medication List       Accurate as of November 05, 2018  9:34 AM. Always use your most recent med list.        albuterol 108 (90 Base) MCG/ACT inhaler Commonly known as:  PROVENTIL HFA;VENTOLIN HFA Inhale 2 puffs into the lungs every 6 (six) hours as needed for wheezing or shortness of breath.   atenolol-chlorthalidone 50-25 MG tablet Commonly known as:  TENORETIC Take 1 tablet by mouth daily.   cholecalciferol 1000 units tablet Commonly known as:  VITAMIN D Take 1,000 Units by mouth daily.   Erenumab-aooe 70 MG/ML Soaj Commonly known as:  AIMOVIG Inject 70 mg into the skin every 30 (thirty) days.   ibuprofen 200 MG tablet Commonly known as:  ADVIL,MOTRIN Take 800 mg by mouth every 8 (eight) hours as needed (for headache).   levothyroxine 137 MCG tablet Commonly known as:  SYNTHROID Take 1 tablet (137 mcg total) by mouth daily before breakfast.   promethazine 12.5 MG tablet Commonly known as:  PHENERGAN Take 1 tablet (12.5 mg total) by mouth every 8 (eight) hours as needed for nausea or vomiting.   Topiramate ER 100 MG Cp24 Commonly known as:  TROKENDI XR Take 100 mg by mouth at bedtime.   zolmitriptan  5 MG tablet Commonly known as:  ZOMIG TAKE 1 TAB BY MOUTH AT THE EARLIEST ONSET OF MIGRAINE; MAY REPEAT ONCE IN 2 HRS IF NEEED, NOT TO EXCEED 2 TABS IN 24 HRS, NO MORE THAN 2 TIM       Allergies:  Allergies  Allergen Reactions  . No Known Allergies     Past Medical History:  Diagnosis Date  . 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 Fibroids   . Headache(784.0) 06/14/2007   Dr. Tomi Likens seeing pt. for HA management   . High blood pressure   . HYPERTENSION 06/14/2007  . Hypothyroidism 2018   thyroidectomy   . Migraines   . Nodular goiter   . Obesity   . PONV (postoperative nausea and vomiting)   . Vitamin D deficiency     Past Surgical History:  Procedure  Laterality Date  . ABDOMINAL HYSTERECTOMY    . [place]   x2, with epidural & general anesth.   Marland Kitchen DILATION AND CURETTAGE OF UTERUS     x2  . HYSTERECTOMY ABDOMINAL WITH SALPINGO-OOPHORECTOMY N/A 01/26/2018   Procedure: SUPRACERVICAL HYSTERECTOMY ABDOMINAL WITH SALPINGO-OOPHORECTOMY;  Surgeon: [name], MD;  Location: [facility];  Service: Gynecology;  Laterality: N/A;  . LYSIS OF ADHESION N/A 01/26/2018   Procedure: EXTENSIVE LYSIS OF ADHESIONS;  Surgeon: [name], MD;  Location: [facility];  Service: Gynecology;  Laterality: N/A;  . THYROIDECTOMY  11/18/2016  . THYROIDECTOMY N/A 11/18/2016   Procedure: TOTAL THYROIDECTOMY;  Surgeon: Armandina Gemma, MD;  Location: [facility];  Service: General;  Laterality: N/A;  . THYROIDECTOMY      Family History  Problem Relation Age of Onset  . Diabetes Mother   . Hypertension Mother   . Thyroid disease Mother     Social History:  reports that she has never smoked. She has never used smokeless tobacco. She reports that she does not drink alcohol or use drugs.      ROS  Hypertension: She had been on Tenoretic but she does not like to take this because of excessive diuresis and probably takes it only twice a week She does not have a PCP currently  Has history of migraines followed by neurologist on Topamax  No history of diabetes but A1c is up slightly, previously had been 6.0 but her blood sugar last was 102 She said that she is trying to exercise  Lab Results  Component Value Date   HGBA1C 6.6 (H) 04/14/2018   She uses albuterol for some wheezing and this helps her breathe and takes it as needed.  Asking for refill since she does not have a PCP    Examination:   BP 130/76 (BP Location: Left Arm, Patient Position: Sitting, Cuff Size: Normal)   Pulse 88   Ht 5\' 5"  (1.651 m)   Wt 244 lb 6.4 oz (110.9 kg)   SpO2 97%   BMI 40.67 kg/m   Biceps reflexes normal No tremor Hands not unusually  warm  Assessment/Plan:  Postsurgical hypothyroidism:  She is subjectively doing well However TSH is unusually low at 0.05  This is despite no change in her levothyroxine 137 dose and this has been a stable dose She will go down to 125 mcg but needs follow-up in 3 months  Possible early diabetes: Although her blood sugars are looking fairly good her A1c was 6.6 Reminded her to start working more on cutting back on carbohydrates, fats and portions To have regular exercise We will check fasting  glucose for the next visit  Since she is taking her atenolol only randomly will not continue this as blood pressure is fairly good.  Need further follow-up with her new PCP  Given her names of PCPs to establish with   There are no Patient Instructions on file for this visit.   Elayne Snare 11/05/2018  Note: This office note was prepared with Dragon voice recognition system technology. Any transcriptional errors that result from this process are unintentional.

## 2018-11-16 MED FILL — ZOLMitriptan 5 MG TABS: 5 | 30 days supply | Qty: 10 | Fill #1

## 2018-12-11 ENCOUNTER — Other Ambulatory Visit: Payer: Self-pay

## 2018-12-11 ENCOUNTER — Ambulatory Visit
Admission: EM | Admit: 2018-12-11 | Discharge: 2018-12-11 | Disposition: A | Discharge status: Home / Self Care | Payer: BLUE CROSS/BLUE SHIELD | Attending: Family Medicine | Admitting: Family Medicine

## 2018-12-11 DIAGNOSIS — G43909 Migraine, unspecified, not intractable, without status migrainosus: Secondary | ICD-10-CM

## 2018-12-11 DIAGNOSIS — I10 Essential (primary) hypertension: Secondary | ICD-10-CM

## 2018-12-11 MED ORDER — KETOROLAC TROMETHAMINE 30 MG/ML IJ SOLN
30.0000 mg | Freq: Once | INTRAMUSCULAR | Status: AC
  Administered 2018-12-11: 30 mg via INTRAMUSCULAR

## 2018-12-11 MED ORDER — DIPHENHYDRAMINE HCL 50 MG/ML IJ SOLN
25.0000 mg | Freq: Once | INTRAMUSCULAR | Status: AC
  Administered 2018-12-11: 25 mg via INTRAMUSCULAR

## 2018-12-11 MED ORDER — DEXAMETHASONE SODIUM PHOSPHATE 10 MG/ML IJ SOLN
10.0000 mg | Freq: Once | INTRAMUSCULAR | Status: AC
  Administered 2018-12-11: 10 mg via INTRAVENOUS

## 2018-12-11 NOTE — Discharge Instructions (Addendum)
Go immediately to the ER if symptoms do not resolve or if symptoms worsen.  Follow-up with Dr. Tomi Likens regarding headaches management.

## 2018-12-11 NOTE — ED Provider Notes (Addendum)
[facility]    CSN: [id number] Arrival date & time: 12/11/18  1251     History   Chief Complaint Chief Complaint  Patient presents with  . Headache    HPI [name] is a 53 y.o. female.   HPI  Headache: Patient complains of headache. She does have a headache at this time. History of migraines and is chronically treated for by neurology for HA.  Description of Headaches: Location of pain: occipital radiating to neck.  Radiation of pain?:left-sided unilateral Character of pain:aching Severity of pain: 2 intermittently. Headaches comes and goes.  Accompanying symptoms: nausea, photophobia, insomnia, diminished appetite   Taking prescribed medications without relief. Typical duration of individual headache: 3 days, HA comes and goes Are most headaches similar in presentation? yes  Past Medical History:  Diagnosis Date  . 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 Fibroids   . Headache(784.0) 06/14/2007   Dr. Tomi Likens seeing pt. for HA management   . High blood pressure   . HYPERTENSION 06/14/2007  . Hypothyroidism 2018   thyroidectomy   . Migraines   . Nodular goiter   . Obesity   . PONV (postoperative nausea and vomiting)   . Vitamin D deficiency     Patient Active Problem List   Diagnosis Date Noted  . Post-operative state 01/26/2018  . Acute cystitis without hematuria 10/24/2017  . Morbid obesity with BMI of 40.0-44.9, adult ([place]) 09/27/2017  . Menorrhagia with regular cycle 08/19/2017  . 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 Depression 12/23/2016  . 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 Vitamin D deficiency 10/22/2016  . Prediabetes 10/22/2016  . Multinodular goiter 12/08/2014  . Hx of migraine headaches 12/08/2014  . Morbid obesity ([place]) 03/14/2010  . Essential hypertension 06/14/2007  . HEADACHE 06/14/2007    Past Surgical History:  Procedure Laterality Date  . ABDOMINAL HYSTERECTOMY    . [place]   x2, with  epidural & general anesth.   Marland Kitchen DILATION AND CURETTAGE OF UTERUS     x2  . HYSTERECTOMY ABDOMINAL WITH SALPINGO-OOPHORECTOMY N/A 01/26/2018   Procedure: SUPRACERVICAL HYSTERECTOMY ABDOMINAL WITH SALPINGO-OOPHORECTOMY;  Surgeon: [name], MD;  Location: [facility];  Service: Gynecology;  Laterality: N/A;  . LYSIS OF ADHESION N/A 01/26/2018   Procedure: EXTENSIVE LYSIS OF ADHESIONS;  Surgeon: [name], MD;  Location: [facility];  Service: Gynecology;  Laterality: N/A;  . THYROIDECTOMY  11/18/2016  . THYROIDECTOMY N/A 11/18/2016   Procedure: TOTAL THYROIDECTOMY;  Surgeon: Armandina Gemma, MD;  Location: [facility];  Service: General;  Laterality: N/A;  . THYROIDECTOMY      OB History    Gravida  3   Para      Term      Preterm      AB  1   Living  2     SAB  1   TAB      Ectopic      Multiple      Live Births               Home Medications    Prior to Admission medications   Medication Sig Start Date End Date Taking? Authorizing Provider  albuterol (PROVENTIL HFA;VENTOLIN HFA) 108 (90 Base) MCG/ACT inhaler Inhale 2 puffs into the lungs every 8 (eight) hours as needed for wheezing or shortness of breath. 11/05/18   Elayne Snare, MD  cholecalciferol (VITAMIN D) 1000 units tablet Take 1,000 Units by mouth daily.    [provider]  Erenumab-aooe (AIMOVIG) 70 MG/ML SOAJ Inject 70 mg into the skin every 30 (thirty) days. 10/22/18   Pieter Partridge, DO  ibuprofen (ADVIL,MOTRIN) 200 MG tablet Take 800 mg by mouth every 8 (eight) hours as needed (for headache).    [provider]  levothyroxine (SYNTHROID) 125 MCG tablet Take 1 tablet (125 mcg total) by mouth daily before breakfast. 11/05/18   Elayne Snare, MD  promethazine (PHENERGAN) 12.5 MG tablet Take 1 tablet (12.5 mg total) by mouth every 8 (eight) hours as needed for nausea or vomiting. 02/24/18   Pieter Partridge, DO  Topiramate ER (TROKENDI XR) 100 MG CP24 Take 100 mg by mouth at bedtime. 07/14/18   Tomi Likens, Adam R, DO    zolmitriptan (ZOMIG) 5 MG tablet TAKE 1 TAB BY MOUTH AT THE EARLIEST ONSET OF MIGRAINE; MAY REPEAT ONCE IN 2 HRS IF NEEED, NOT TO EXCEED 2 TABS IN 24 HRS, NO MORE THAN 2 TIM 10/18/18   Pieter Partridge, DO    Family History Family History  Problem Relation Age of Onset  . Diabetes Mother   . Hypertension Mother   . Thyroid disease Mother     Social History Social History   Tobacco Use  . Smoking status: Never Smoker  . Smokeless tobacco: Never Used  Substance Use Topics  . Alcohol use: No  . Drug use: No     Allergies   No known allergies   Review of Systems Review of Systems  Pertinent negatives listed in HPI Physical Exam Triage Vital Signs ED Triage Vitals [12/11/18 1302]  Enc Vitals Group     BP (!) 160/102     Pulse Rate 97     Resp 16     Temp 98.3 F (36.8 C)     Temp Source Oral     SpO2 94 %     Weight 240 lb (108.9 kg)     Height 5\' 5"  (1.651 m)     Head Circumference      Peak Flow      Pain Score 10     Pain Loc      Pain Edu?      Excl. in [place]?    No data found.  Updated Vital Signs BP (!) 160/102 (BP Location: Left Arm)   Pulse 97   Temp 98.3 F (36.8 C) (Oral)   Resp 16   Ht 5\' 5"  (1.651 m)   Wt 240 lb (108.9 kg)   SpO2 94%   BMI 39.94 kg/m   Visual Acuity Right Eye Distance:   Left Eye Distance:   Bilateral Distance:    Right Eye Near:   Left Eye Near:    Bilateral Near:     Physical Exam Constitutional:      Appearance: She is well-developed. She is not toxic-appearing or diaphoretic.  HENT:     Head: Normocephalic and atraumatic.     Mouth/Throat:     Mouth: Mucous membranes are moist.  Neck:     Musculoskeletal: Normal range of motion.  Cardiovascular:     Rate and Rhythm: Normal rate and regular rhythm.  Pulmonary:     Effort: Pulmonary effort is normal.     Breath sounds: Normal breath sounds.  Neurological:     Mental Status: She is alert.     Motor: No weakness.     Gait: Gait normal.      UC  Treatments / Results  Labs (all labs ordered are listed,  but only abnormal results are displayed) Labs Reviewed - No data to display  EKG None  Radiology No results found.  Procedures Procedures (including critical care time)  Medications Ordered in UC Medications  ketorolac (TORADOL) 30 MG/ML injection 30 mg (30 mg Intramuscular Given 12/11/18 1342)  diphenhydrAMINE (BENADRYL) injection 25 mg (25 mg Intramuscular Given 12/11/18 1342)  dexamethasone (DECADRON) injection 10 mg (10 mg Intravenous Given 12/11/18 1342)    Initial Impression / Assessment and Plan / UC Course  I have reviewed the triage vital signs and the nursing notes.  Pertinent labs & imaging results that were available during my care of the patient were reviewed by me and considered in my medical decision making (see chart for details).  Patient with a history of chronic migraines and currently under the care of neurology, presents with acute headache waxing an waning over a period of 3 days. Current home medications recently ineffective in controlling headaches. Patient given a headache cocktail. Reported improved of intensity of headache. Patient has a history of hypertension and reports stopped medications. Recommend resuming prior BP medications and follow-up with neurology regarding recent headache.   Final Clinical Impressions(s) / UC Diagnoses   Final diagnoses:  Migraine without status migrainosus, not intractable, unspecified migraine type  Essential hypertension     Discharge Instructions     Go immediately to the ER if symptoms do not resolve or if symptoms worsen.  Follow-up with Dr. Tomi Likens regarding headaches management.     ED Prescriptions    None     Controlled Substance Prescriptions [place] Controlled Substance Registry consulted? Not Applicable   Scot Jun, FNP 12/14/18 2103    Scot Jun, FNP 12/14/18 2106

## 2018-12-11 NOTE — ED Triage Notes (Signed)
Per pt she has a hx of migraines and has been having headache since Thursday and has not slept in 3 days. Pt stated it is in the left side of her head and radiated to the left side of her neck. Pt has hard time turning her head due to pain. Alert oriented x 4

## 2018-12-14 MED FILL — AIMOVIG 70 MG/ML SOAJ: 70 | 30 days supply | Qty: 1 | Fill #1 | Status: TO

## 2018-12-29 NOTE — Progress Notes (Signed)
Rcvd fax from [facility] Aimovig 70 mg additionally approved through 06/29/2019

## 2019-01-24 ENCOUNTER — Ambulatory Visit: Payer: BLUE CROSS/BLUE SHIELD | Admitting: Family Medicine

## 2019-01-28 MED FILL — PROMETHAZINE 12.5 MG TABLET: 12.5 | 10 days supply | Qty: 30 | Fill #0

## 2019-01-28 MED FILL — TROKENDI XR 100 MG CAPSULE: 100 | 30 days supply | Qty: 30 | Fill #0

## 2019-01-28 MED FILL — ZOLMitriptan 5 MG TABS: 5 | 30 days supply | Qty: 10 | Fill #0

## 2019-02-02 ENCOUNTER — Other Ambulatory Visit: Payer: 59

## 2019-02-08 ENCOUNTER — Ambulatory Visit: Payer: 59 | Admitting: Endocrinology

## 2019-02-25 ENCOUNTER — Ambulatory Visit: Payer: 59 | Admitting: Neurology

## 2019-03-08 MED FILL — AIMOVIG 70 MG/ML SOAJ: 70 | 30 days supply | Qty: 1 | Fill #0

## 2019-03-11 ENCOUNTER — Other Ambulatory Visit: Payer: Self-pay

## 2019-03-11 DIAGNOSIS — G43009 Migraine without aura, not intractable, without status migrainosus: Secondary | ICD-10-CM

## 2019-03-11 MED ORDER — ERENUMAB-AOOE 70 MG/ML ~~LOC~~ SOAJ: 70.0000 mg | SUBCUTANEOUS | 3 refills | Status: DC

## 2019-03-11 MED ORDER — TOPIRAMATE ER 100 MG PO CAP24: 100.0000 mg | ORAL_CAPSULE | Freq: Every day | ORAL | 3 refills | Status: DC

## 2019-04-18 ENCOUNTER — Ambulatory Visit: Payer: BLUE CROSS/BLUE SHIELD | Admitting: Neurology

## 2019-04-19 ENCOUNTER — Other Ambulatory Visit: Payer: Self-pay | Admitting: Physician Assistant

## 2019-04-19 DIAGNOSIS — Z1231 Encounter for screening mammogram for malignant neoplasm of breast: Secondary | ICD-10-CM

## 2019-05-06 NOTE — Telephone Encounter (Signed)
error 

## 2019-06-20 ENCOUNTER — Ambulatory Visit: Payer: BLUE CROSS/BLUE SHIELD

## 2019-06-21 ENCOUNTER — Encounter: Payer: Self-pay | Admitting: *Deleted

## 2019-06-21 NOTE — Progress Notes (Signed)
[name] (Key: [id number]) Aimovig 70MG /ML auto-injectors   Form [facility] Electronic Prior Authorization Form (2017 NCPDP) Created 21 days ago Sent to Plan 11 minutes ago Plan Response 10 minutes ago Submit Clinical Questions less than a minute ago Determination Wait for Determination Please wait for [facility] 2017 NCPDP to return a determination.

## 2019-07-01 ENCOUNTER — Telehealth: Payer: Self-pay | Admitting: Neurology

## 2019-07-01 NOTE — Telephone Encounter (Signed)
The following message was left with [employer] on 06/30/2019 at 4:39 PM.    Caller wanting to know if doctor can call in a different medication for her migraines. The current medicines, Zomig and Trikende isn't working. She is having migraines.  Caller is needing new medication. The current ones aren't working and she is having migraines. Office hours provided, triage refused.

## 2019-07-04 MED ORDER — NURTEC 75 MG PO TBDP: 1.0000 | ORAL_TABLET | Freq: Once | ORAL | 1 refills | Status: DC | PRN

## 2019-07-04 NOTE — Telephone Encounter (Signed)
~  [name]  Can you look into this?

## 2019-07-04 NOTE — Telephone Encounter (Signed)
1.  For preventative, she was supposed to start Aimovig 70mg  monthly?  Did she start this and if so, when? 2.  Instead of Zomig, we can prescribe her Nurtec 75mg  tablet:  She should take 1 tablet when she gets a migraine (no more than 1 tablet in 24 hours).  #8 tablets.  Refills 1. 3.  She needs to make a follow up appointment

## 2019-07-04 NOTE — Telephone Encounter (Signed)
I spoke with a representative at [employer].  The initial prior authorization was denied because the form asked if the medication (Aimovig) would be taken in conjunction with another CGRP inhibitor and it was incorrectly answered "yes".  We need to resubmit prior authorization with the correct answer that the patient will not be taking Aimovig with another CGRP inhibitor.

## 2019-07-04 NOTE — Telephone Encounter (Signed)
She is not on two other injections.  I definitely want to appeal this ASAP

## 2019-07-04 NOTE — Progress Notes (Addendum)
Called [phone] [facility] PA department listed on the [place] form. I called to ask why another PA was started when we had a denial last week and we were going to appeal. Spoke with [place]. She said it is faster to do another PA over the phone than a denial so we did it over the phone Aprroved from 07/04/2019-07/03/2020 [id number] Once the fax is received we will send to scan into her chart.

## 2019-07-04 NOTE — Telephone Encounter (Signed)
Called spoke with patient she states she has been taken Aimovig for the past 3 months and it has help. PA was denied reason being one of the answer was answered that she is taken 2 injection at the same time. Pt requesting a appeal for this.   Pt was also given provider information regarding new Rx. [facility]  appt scheduled for this week

## 2019-07-04 NOTE — Telephone Encounter (Signed)
See below [facility] with changing Rx?

## 2019-07-04 NOTE — Telephone Encounter (Signed)
Ok I just got off the phone with [employer] and it was fixed and approved. I documented the approval and I will call pt with the news.

## 2019-07-05 ENCOUNTER — Other Ambulatory Visit: Payer: Self-pay

## 2019-07-05 ENCOUNTER — Encounter: Payer: Self-pay | Admitting: Neurology

## 2019-07-05 ENCOUNTER — Ambulatory Visit: Payer: BC Managed Care – PPO | Admitting: Neurology

## 2019-07-05 VITALS — BP 123/82 | HR 73 | Temp 98.0°F | Ht 65.0 in | Wt 240.0 lb

## 2019-07-05 DIAGNOSIS — M5417 Radiculopathy, lumbosacral region: Secondary | ICD-10-CM

## 2019-07-05 DIAGNOSIS — G43009 Migraine without aura, not intractable, without status migrainosus: Secondary | ICD-10-CM

## 2019-07-05 MED ORDER — NURTEC 75 MG PO TBDP: 1.0000 | ORAL_TABLET | ORAL | 0 refills | Status: DC | PRN

## 2019-07-05 MED ORDER — TROKENDI XR 50 MG PO CP24: 1.0000 | ORAL_CAPSULE | Freq: Every day | ORAL | 0 refills | Status: DC

## 2019-07-05 NOTE — Addendum Note (Signed)
Addended by: Jesse Fall on: 07/05/2019 03:22 PM   Modules accepted: Orders

## 2019-07-05 NOTE — Progress Notes (Signed)
NEUROLOGY FOLLOW UP OFFICE NOTE  [name] [id number]  HISTORY OF PRESENT ILLNESS: [name] is a 53 year old left-handed female with hypertension and acquired hypothyroidism who follows up for migraines.  UPDATE:  Much improvement with Aimovig.  However, Zomig now ineffective. Intensity:  moderate to severe Duration:  several hours (Zomig no longer effective) Frequency:  2 to 4 days a month Frequency of abortive medication: once a week at most.    Current NSAIDS: Aleve Current analgesics: Tylenol Current triptans: Zomig 5 mg tablet Current ergotamine: None Current anti-emetic: Promethazine 12.5 mg Current muscle relaxants: None Current anti-anxiolytic: None Current sleep aide: None Current Antihypertensive medications: Atenolol- chlorthalidone Current Antidepressant medications: None Current Anticonvulsant medications: Topiramate ER 100 mg Current anti-CGRP: Aimovig 70mg  Current Vitamins/Herbal/Supplements: None Current Antihistamines/Decongestants: None Other therapy: Caffeine for abortive therapy Hormone/birth control: None  She reports chronic low back pain with pain radiating down the back of her left leg down to the ankle.  No preceding trauma.  No weakness.  Caffeine: Coffee usually only for headache treatment Alcohol: None Smoker: None Diet: Sees nutritionist.  Keeps hydrated Exercise: No Depression: No; Anxiety: Mild Other pain: No Sleep hygiene: Okay  HISTORY:  Onset: She has had migraines since age 56, however they have steadily gotten worse over the past year and have been daily since last week. Location: Varies(either side, holocephalic).Over the past week, she has had left sided neck pain Quality:pounding Initial Intensity:10/10; December: 4/10 mild, 10/10 severe Aura:no Prodrome:no Associated symptoms: Nausea, photophobia, sometimes vomiting Iniitial Duration:Several hours to 2 days Initial Frequency:Usually 2-3  times a month Triggers: Menstrual cycle Relieving factors: Sleep, medication Activity:Cannot function  Past abortive medication:Tylenol, sumatriptan NS,Zomig NS (effective but does not like nasal sprays),sumatriptan 100mg  tablet, Relpax,naproxen, diclofenac,caffeine or hydrocodone, rizatriptan, Zembrace SymTouch (effective but makes her drowsy), sumatriptan 100mg  tablet (ineffective) Past preventative medication:nortriptyline 10mg  (concerned about weight gain), metoprolol Other past therapy:magnesium citrate, coQ10, riboflavin  Family history of headache:No  PAST MEDICAL HISTORY: Past Medical History:  Diagnosis Date  . 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 Fibroids   . Headache(784.0) 06/14/2007   Dr. Tomi Likens seeing pt. for HA management   . High blood pressure   . HYPERTENSION 06/14/2007  . Hypothyroidism 2018   thyroidectomy   . Migraines   . Nodular goiter   . Obesity   . PONV (postoperative nausea and vomiting)   . Vitamin D deficiency     MEDICATIONS: Current Outpatient Medications on File Prior to Visit  Medication Sig Dispense Refill  . atenolol-chlorthalidone (TENORETIC) 50-25 MG tablet Take by mouth.    . cholecalciferol (VITAMIN D) 1000 units tablet Take 1,000 Units by mouth daily.    Eduard Roux (AIMOVIG) 70 MG/ML SOAJ Inject 70 mg into the skin every 30 (thirty) days. 1 pen 3  . ibuprofen (ADVIL,MOTRIN) 200 MG tablet Take 800 mg by mouth every 8 (eight) hours as needed (for headache).    Marland Kitchen levothyroxine (SYNTHROID) 125 MCG tablet Take 1 tablet (125 mcg total) by mouth daily before breakfast. 90 tablet 1  . promethazine (PHENERGAN) 12.5 MG tablet Take 1 tablet (12.5 mg total) by mouth every 8 (eight) hours as needed for nausea or vomiting. 30 tablet 3  . Topiramate ER (TROKENDI XR) 100 MG CP24 Take 100 mg by mouth at bedtime. 30 capsule 3  . albuterol (PROVENTIL HFA;VENTOLIN HFA) 108 (90 Base) MCG/ACT inhaler Inhale 2 puffs into the lungs every 8 (eight)  hours as needed for wheezing or shortness of breath. (Patient not taking:  Reported on 07/05/2019) 1 Inhaler 0  . Rimegepant Sulfate (NURTEC) 75 MG TBDP Take 1 tablet by mouth once as needed for up to 1 dose (on onset of headaches). 8 tablet 1  . zolmitriptan (ZOMIG) 5 MG tablet TAKE 1 TAB BY MOUTH AT THE EARLIEST ONSET OF MIGRAINE; MAY REPEAT ONCE IN 2 HRS IF NEEED, NOT TO EXCEED 2 TABS IN 24 HRS, NO MORE THAN 2 TIM (Patient not taking: Reported on 07/05/2019) 10 tablet 3   No current facility-administered medications on file prior to visit.     ALLERGIES: Allergies  Allergen Reactions  . No Known Allergies     FAMILY HISTORY: Family History  Problem Relation Age of Onset  . Diabetes Mother   . Hypertension Mother   . Thyroid disease Mother     SOCIAL HISTORY: Social History   Socioeconomic History  . Marital status: Married    Spouse name: Not on file  . Number of children: 3  . Years of education: Not on file  . Highest education level: Not on file  Occupational History  . Occupation: Airline pilot: [employer]  . Financial resource strain: Not on file  . Food insecurity    Worry: Not on file    Inability: Not on file  . Transportation needs    Medical: Not on file    Non-medical: Not on file  Tobacco Use  . Smoking status: Never Smoker  . Smokeless tobacco: Never Used  Substance and Sexual Activity  . Alcohol use: No  . Drug use: No  . Sexual activity: Yes    Birth control/protection: None  Lifestyle  . Physical activity    Days per week: Not on file    Minutes per session: Not on file  . Stress: Not on file  Relationships  . Social Herbalist on phone: Not on file    Gets together: Not on file    Attends religious service: Not on file    Active member of club or organization: Not on file    Attends meetings of clubs or organizations: Not on file    Relationship status: Not on file  . Intimate partner violence     Fear of current or ex partner: Not on file    Emotionally abused: Not on file    Physically abused: Not on file    Forced sexual activity: Not on file  Other Topics Concern  . Not on file  Social History Narrative   One level home; lives with daughter and  Husband; some college; coffee daily 1 cup occ soft drinks; exercise none    REVIEW OF SYSTEMS: Constitutional: No fevers, chills, or sweats, no generalized fatigue, change in appetite Eyes: No visual changes, double vision, eye pain Ear, nose and throat: No hearing loss, ear pain, nasal congestion, sore throat Cardiovascular: No chest pain, palpitations Respiratory:  No shortness of breath at rest or with exertion, wheezes GastrointestinaI: No nausea, vomiting, diarrhea, abdominal pain, fecal incontinence Genitourinary:  No dysuria, urinary retention or frequency Musculoskeletal:  back pain Integumentary: No rash, pruritus, skin lesions Neurological: as above Psychiatric: No depression, insomnia, anxiety Endocrine: No palpitations, fatigue, diaphoresis, mood swings, change in appetite, change in weight, increased thirst Hematologic/Lymphatic:  No purpura, petechiae. Allergic/Immunologic: no itchy/runny eyes, nasal congestion, recent allergic reactions, rashes  PHYSICAL EXAM: Blood pressure 123/82, pulse 73, temperature 98 F (36.7 C), height 5\' 5"  (1.651 m), weight 240 lb (108.9  kg), SpO2 96 %. General: No acute distress.  Patient appears well-groomed.   Head:  Normocephalic/atraumatic Eyes:  Fundi examined but not visualized Neck: supple, no paraspinal tenderness, full range of motion Heart:  Regular rate and rhythm Lungs:  Clear to auscultation bilaterally Back: Left lower paraspinal tenderness Straight leg raise negative. Neurological Exam: alert and oriented to person, place, and time. Attention span and concentration intact, recent and remote memory intact, fund of knowledge intact.  Speech fluent and not dysarthric,  language intact.  CN II-XII intact. Bulk and tone normal, muscle strength 5/5 throughout.  Sensation to light touch, temperature and vibration intact.  Deep tendon reflexes 2+ throughout, toes downgoing.  Finger to nose and heel to shin testing intact.  Gait normal, Romberg negative.  IMPRESSION: Migraine without aura, without status migrainosus, not intractable Left sided lumbosacral radiculopathy  PLAN: 1.  Aimovig 70mg  monthly.   2.  Taper off Trokendi, 50mg  daily for one week, then stop 3.  Stop Zomig.  For abortive therapy, Nurtec 75mg . 4.  Advised to consider seeing Dr. Hulan Saas for lumbosacral radiculopathy. 5.  Limit use of pain relievers to no more than 2 days out of week to prevent risk of rebound or medication-overuse headache. 6.  Keep headache diary 7.  Follow up in 5 months.  Metta Clines, DO

## 2019-07-05 NOTE — Patient Instructions (Addendum)
1.  Continue Aimovig 70mg  monthly 2.  Take Trokendi XR 50mg  daily for one week, then stop 3.  Stop Zomig.  Take Nurtec 75mg  tablet as needed (maximum 1 tablet in 24 hours) 4.  For back and radicular leg pain, see if you can follow up with Dr. Tamala Julian 5.  Follow up in 5 months.

## 2019-07-07 ENCOUNTER — Other Ambulatory Visit: Payer: Self-pay

## 2019-07-07 DIAGNOSIS — G43009 Migraine without aura, not intractable, without status migrainosus: Secondary | ICD-10-CM

## 2019-07-07 MED ORDER — AIMOVIG 70 MG/ML ~~LOC~~ SOAJ: 70.0000 mg | SUBCUTANEOUS | 11 refills | Status: DC

## 2019-07-08 ENCOUNTER — Encounter: Payer: Self-pay | Admitting: *Deleted

## 2019-07-08 NOTE — Progress Notes (Signed)
[name] (Key: [id number]) Nurtec 75MG  dispersible tablets   Form [facility] Electronic Prior Authorization Form (2017 NCPDP) Created 4 days ago Sent to Plan 8 minutes ago Plan Response 7 minutes ago Submit Clinical Questions 1 minute ago Determination Wait for Determination Please wait for [facility] 2017 NCPDP to return a determination.

## 2019-07-15 NOTE — Progress Notes (Signed)
Yes you are correct it is Nurtec and the denial was converted to approved after calling [phone] PA- [id number] Valid 07/15/2019-10/14/2019 We will receive a fax from [facility] confirming the above.

## 2019-07-15 NOTE — Progress Notes (Signed)
I think there was a misunderstanding.  [name] is to remain on Aimovig.  I had started her on Nurtec for abortive migraine therapy.

## 2019-07-15 NOTE — Progress Notes (Signed)
The denial for Emgality was based on: Have fewer than 15 headache days per month (now that she is on Aimovig). You prescribed Emgality because she told you that once the Aimovig started working she was only having 1-2 headaches per week and the Zomig was no longer working for those.  Do you want to appeal?

## 2019-07-20 ENCOUNTER — Telehealth: Payer: Self-pay | Admitting: Neurology

## 2019-07-20 NOTE — Telephone Encounter (Signed)
Patient called regarding her medication for headaches and [facility] not wanting to pay for it. She said because she has more than 15 headaches a month. She said that is not the case now with taking the Aimovig. She said the Nurtec works a lot better. Please Call. Thanks

## 2019-07-21 NOTE — Telephone Encounter (Signed)
please advise.

## 2019-07-21 NOTE — Telephone Encounter (Signed)
As per office note 07/05/19, her headache frequency is stated as 2 to 4 per month.  I would like to resubmit for coverage of Nurtec with updated info.

## 2019-07-25 NOTE — Telephone Encounter (Signed)
I called and spoke with [name].  We had appealed it already (07/15/2019 approved) by the time she received the denial letter. She picked up her meds 07/23/2019. She said she wanted to thank all of [name] involved in this. She was very appreciative of our efforts to help her get her meds so she can get effective treatment for her migraines.

## 2019-10-25 ENCOUNTER — Other Ambulatory Visit: Payer: Self-pay

## 2019-10-25 DIAGNOSIS — G43111 Migraine with aura, intractable, with status migrainosus: Secondary | ICD-10-CM

## 2019-10-25 MED ORDER — PROMETHAZINE HCL 12.5 MG PO TABS: 12.5000 mg | ORAL_TABLET | Freq: Three times a day (TID) | ORAL | 3 refills | Status: DC | PRN

## 2019-10-25 MED ORDER — NURTEC 75 MG PO TBDP: 1.0000 | ORAL_TABLET | Freq: Once | ORAL | 1 refills | Status: DC | PRN

## 2019-11-24 ENCOUNTER — Telehealth: Payer: Self-pay | Admitting: Neurology

## 2019-11-24 NOTE — Telephone Encounter (Signed)
Pt called informed that we do not have any samples at this time, she ask that we call her if we get any in,

## 2019-11-24 NOTE — Telephone Encounter (Signed)
Patient wants to know if we can give her a Sample of the [place] if she does not answer she said please leave a message

## 2019-12-05 ENCOUNTER — Encounter: Payer: Self-pay | Admitting: Neurology

## 2019-12-05 NOTE — Progress Notes (Signed)
Virtual Visit via Video Note The purpose of this virtual visit is to provide medical care while limiting exposure to the novel coronavirus.    Consent was obtained for video visit:  Yes.   Answered questions that patient had about telehealth interaction:  Yes.   I discussed the limitations, risks, security and privacy concerns of performing an evaluation and management service by telemedicine. I also discussed with the patient that there may be a patient responsible charge related to this service. The patient expressed understanding and agreed to proceed.  Pt location: Home Physician Location: office Name of referring provider:  No ref. provider found I connected with [name] at patients initiation/request on 12/06/2019 at  8:30 AM EST by video enabled telemedicine application and verified that I am speaking with the correct person using two identifiers. Pt MRN:  [id number] Pt DOB:  [birth year] Video Participants:  [name]   History of Present Illness:  [name] is a 54 year old left-handed female with hypertension and acquired hypothyroidism who follows up for migraines.  UPDATE: Tapered off of Trokendi.  Responds well to Nurtec. Intensity:moderate to severe Duration:1 to 2 hours.   Frequency:2 to 4 days a month Frequency of abortive medication:once a week at most.  Current NSAIDS:Aleve Current analgesics:Tylenol Current triptans:none Current ergotamine:None Current anti-emetic:Promethazine 12.5 mg Current muscle relaxants:None Current anti-anxiolytic:None Current sleep aide:None Current Antihypertensive medications:Atenolol-chlorthalidone Current Antidepressant medications:None Current Anticonvulsant medications:none Current anti-CGRP:Aimovig 70mg ; Nurtec Current Vitamins/Herbal/Supplements:None Current Antihistamines/Decongestants:None Other therapy:Caffeine for abortive therapy Hormone/birth control: None  She reports  chronic low back pain with pain radiating down the back of her left leg down to the ankle.  No preceding trauma.  No weakness.  She started exercising and working out 3 days a week and she has changed her diet.  She is doing better.  Caffeine:Coffee usually only for headache treatment Alcohol:None Smoker:None Diet:Sees nutritionist. Keeps hydrated Exercise:Started exercising and working out 3 days a week with trainer. Depression:No; Anxiety:Mild.  Much better working from home.   Other pain:No Sleep hygiene:Okay  HISTORY: Onset: She has had migraines since age 74, however they have steadily gotten worse over the past year and have been daily since last week. Location: Varies(either side, holocephalic).Over the past week, she has had left sided neck pain Quality:pounding Initial Intensity:10/10; December: 4/10 mild, 10/10 severe Aura:no Prodrome:no Associated symptoms: Nausea, photophobia, sometimes vomiting Iniitial Duration:Several hours to 2 days Initial Frequency:Usually 2-3 times a month Triggers: Menstrual cycle Relieving factors: Sleep, medication Activity:Cannot function  Past NSAIDS:  Naproxen; diclofenac Past analgesics:  Tylenol; hydrocodone Past abortive triptans:  Zomig 5mg  tablet (lost efficacy); Zomig 5mg  NS (effective but nasal spray irritant); sumatriptan NS; sumatriptan 100mg ; eletriptan; Zembrace SymTouch (effective but makes her drowsy); rizatriptan 10mg  Past abortive ergotamine:  none Past muscle relaxants:  none Past anti-emetic:  none Past antihypertensive medications:  metoprolol Past antidepressant medications:  Nortriptyline  Past anticonvulsant medications:  Trokendi XR 100mg  Past anti-CGRP:  none Past vitamins/Herbal/Supplements:  Magnesium citrate; Co-Q 10; ribolfavin Past antihistamines/decongestants:  none Other past therapies:  none   Family history of headache:No  Past Medical History: Past Medical History:    Diagnosis Date  . 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 Fibroids   . Headache(784.0) 06/14/2007   Dr. Tomi Likens seeing pt. for HA management   . High blood pressure   . HYPERTENSION 06/14/2007  . Hypothyroidism 2018   thyroidectomy   . Migraines   . Nodular goiter   . Obesity   . PONV (postoperative nausea and  vomiting)   . Vitamin D deficiency     Medications: Outpatient Encounter Medications as of 12/06/2019  Medication Sig  . albuterol (PROVENTIL HFA;VENTOLIN HFA) 108 (90 Base) MCG/ACT inhaler Inhale 2 puffs into the lungs every 8 (eight) hours as needed for wheezing or shortness of breath. (Patient not taking: Reported on 07/05/2019)  . atenolol-chlorthalidone (TENORETIC) 50-25 MG tablet Take by mouth.  . cholecalciferol (VITAMIN D) 1000 units tablet Take 1,000 Units by mouth daily.  Eduard Roux (AIMOVIG) 70 MG/ML SOAJ Inject 70 mg into the skin every 30 (thirty) days.  Marland Kitchen ibuprofen (ADVIL,MOTRIN) 200 MG tablet Take 800 mg by mouth every 8 (eight) hours as needed (for headache).  Marland Kitchen levothyroxine (SYNTHROID) 125 MCG tablet Take 1 tablet (125 mcg total) by mouth daily before breakfast.  . promethazine (PHENERGAN) 12.5 MG tablet Take 1 tablet (12.5 mg total) by mouth every 8 (eight) hours as needed for nausea or vomiting.  . Rimegepant Sulfate (NURTEC) 75 MG TBDP Take 1 tablet by mouth as needed. May take one as needed for migraine no more than 1 in 24 hours  . Rimegepant Sulfate (NURTEC) 75 MG TBDP Take 1 tablet by mouth once as needed for up to 1 dose (on onset of headaches).  . Topiramate ER (TROKENDI XR) 50 MG CP24 Take 1 tablet by mouth daily. Take one tab daily then stop   No facility-administered encounter medications on file as of 12/06/2019.    Allergies: Allergies  Allergen Reactions  . No Known Allergies     Family History: Family History  Problem Relation Age of Onset  . Diabetes Mother   . Hypertension Mother   . Thyroid disease Mother     Social History: Social  History   Socioeconomic History  . Marital status: Married    Spouse name: Not on file  . Number of children: 3  . Years of education: Not on file  . Highest education level: Not on file  Occupational History  . Occupation: Airline pilot: [employer]  Tobacco Use  . Smoking status: Never Smoker  . Smokeless tobacco: Never Used  Substance and Sexual Activity  . Alcohol use: No  . Drug use: No  . Sexual activity: Yes    Birth control/protection: None  Other Topics Concern  . Not on file  Social History Narrative   One level home; lives with daughter and  Husband; some college; coffee daily 1 cup occ soft drinks; exercise none   Social Determinants of Health   Financial Resource Strain:   . Difficulty of Paying Living Expenses: Not on file  Food Insecurity:   . Worried About Charity fundraiser in the Last Year: Not on file  . Ran Out of Food in the Last Year: Not on file  Transportation Needs:   . Lack of Transportation (Medical): Not on file  . Lack of Transportation (Non-Medical): Not on file  Physical Activity:   . Days of Exercise per Week: Not on file  . Minutes of Exercise per Session: Not on file  Stress:   . Feeling of Stress : Not on file  Social Connections:   . Frequency of Communication with Friends and Family: Not on file  . Frequency of Social Gatherings with Friends and Family: Not on file  . Attends Religious Services: Not on file  . Active Member of Clubs or Organizations: Not on file  . Attends Archivist Meetings: Not on file  . Marital Status:  Not on file  Intimate Partner Violence:   . Fear of Current or Ex-Partner: Not on file  . Emotionally Abused: Not on file  . Physically Abused: Not on file  . Sexually Abused: Not on file    Observations/Objective:   Height 5\' 5"  (1.651 m), weight 234 lb (106.1 kg). No acute distress.  Alert and oriented.  Speech fluent and not dysarthric.  Language intact.  Eyes orthophoric on  primary gaze.  Face symmetric.  Assessment and Plan:   Migraine without aura, without status migrainosus, not intractable  1.  For preventative management, Aimovig 140mg  2.  For abortive therapy, Nurtec.  Promethazine 12.5mg  for nausea 3.  Limit use of pain relievers to no more than 2 days out of week to prevent risk of rebound or medication-overuse headache. 4.  Keep headache diary 5.  Exercise, hydration, caffeine cessation, sleep hygiene, monitor for and avoid triggers 6. Follow up 6 months.   Follow Up Instructions:    -I discussed the assessment and treatment plan with the patient. The patient was provided an opportunity to ask questions and all were answered. The patient agreed with the plan and demonstrated an understanding of the instructions.   The patient was advised to call back or seek an in-person evaluation if the symptoms worsen or if the condition fails to improve as anticipated.     Dudley Major, DO

## 2019-12-06 ENCOUNTER — Encounter: Payer: Self-pay | Admitting: Neurology

## 2019-12-06 ENCOUNTER — Other Ambulatory Visit: Payer: Self-pay

## 2019-12-06 ENCOUNTER — Telehealth (INDEPENDENT_AMBULATORY_CARE_PROVIDER_SITE_OTHER): Payer: BC Managed Care – PPO | Admitting: Neurology

## 2019-12-06 DIAGNOSIS — G43009 Migraine without aura, not intractable, without status migrainosus: Secondary | ICD-10-CM | POA: Diagnosis not present

## 2019-12-06 DIAGNOSIS — G43111 Migraine with aura, intractable, with status migrainosus: Secondary | ICD-10-CM

## 2019-12-06 MED ORDER — NURTEC 75 MG PO TBDP: 1.0000 | ORAL_TABLET | Freq: Once | ORAL | 3 refills | Status: DC | PRN

## 2019-12-06 MED ORDER — PROMETHAZINE HCL 12.5 MG PO TABS: 12.5000 mg | ORAL_TABLET | Freq: Three times a day (TID) | ORAL | 3 refills | Status: DC | PRN

## 2019-12-06 MED ORDER — AIMOVIG 70 MG/ML ~~LOC~~ SOAJ: 70.0000 mg | SUBCUTANEOUS | 3 refills | Status: DC

## 2019-12-28 ENCOUNTER — Encounter: Payer: Self-pay | Admitting: *Deleted

## 2019-12-28 NOTE — Progress Notes (Signed)
[employer] has established a business relationship with the Engineer, production that results in a differentiated user experience on [employer] for the PA process, and may include patient support services. There may be lower cost medications available or preferred on your patient's plan. [name] (Key: [id number]) Nurtec 75MG  OR TBDP   Form [facility] Electronic Prior Authorization Form (2017 NCPDP) Created 4 minutes ago Sent to Plan less than a minute ago Determination Wait for Questions [facility] 2017 NCPDP typically responds with questions in less than 15 minutes, but may take up to 24 hours.

## 2020-03-09 ENCOUNTER — Other Ambulatory Visit: Payer: Self-pay | Admitting: Neurology

## 2020-03-09 MED ORDER — AIMOVIG 140 MG/ML ~~LOC~~ SOAJ: 140.0000 mg | SUBCUTANEOUS | 11 refills | Status: DC

## 2020-05-25 ENCOUNTER — Ambulatory Visit (INDEPENDENT_AMBULATORY_CARE_PROVIDER_SITE_OTHER): Payer: BC Managed Care – PPO

## 2020-05-25 ENCOUNTER — Other Ambulatory Visit: Payer: Self-pay

## 2020-05-25 DIAGNOSIS — G43009 Migraine without aura, not intractable, without status migrainosus: Secondary | ICD-10-CM | POA: Diagnosis not present

## 2020-05-25 MED ORDER — DIPHENHYDRAMINE HCL 50 MG/ML IJ SOLN
25.0000 mg | Freq: Once | INTRAMUSCULAR | Status: AC
  Administered 2020-05-25: 25 mg via INTRAMUSCULAR

## 2020-05-25 MED ORDER — METOCLOPRAMIDE HCL 5 MG/ML IJ SOLN
10.0000 mg | Freq: Once | INTRAMUSCULAR | Status: AC
  Administered 2020-05-25: 10 mg via INTRAMUSCULAR

## 2020-05-25 MED ORDER — KETOROLAC TROMETHAMINE 60 MG/2ML IM SOLN
60.0000 mg | Freq: Once | INTRAMUSCULAR | Status: AC
  Administered 2020-05-25: 60 mg via INTRAMUSCULAR

## 2020-06-02 ENCOUNTER — Ambulatory Visit: Payer: BC Managed Care – PPO

## 2020-06-04 ENCOUNTER — Encounter: Payer: Self-pay | Admitting: Neurology

## 2020-06-04 NOTE — Progress Notes (Addendum)
Jennet Scroggin (Key: [id number]) Aimovig 140MG /ML auto-injectors   Form [facility] Electronic Prior Authorization Form (2017 NCPDP) Created 10 minutes ago Sent to Plan 8 minutes ago Plan Response 8 minutes ago Submit Clinical Questions 5 minutes ago Determination Favorable 3 minutes ago Message from Plan Request Reference Number: [id number]. AIMOVIG INJ 140MG /ML is approved through 06/04/2021. Your patient may now fill this prescription and it will be covered.on.

## 2020-06-05 NOTE — Progress Notes (Signed)
Virtual Visit via Video Note The purpose of this virtual visit is to provide medical care while limiting exposure to the novel coronavirus.    Consent was obtained for video visit:  Yes.   Answered questions that patient had about telehealth interaction:  Yes.   I discussed the limitations, risks, security and privacy concerns of performing an evaluation and management service by telemedicine. I also discussed with the patient that there may be a patient responsible charge related to this service. The patient expressed understanding and agreed to proceed.  Pt location: Home Physician Location: office Name of referring provider:  No ref. provider found I connected with [name] at patients initiation/request on 06/06/2020 at  9:50 AM EDT by video enabled telemedicine application and verified that I am speaking with the correct person using two identifiers. Pt MRN:  [id number] Pt DOB:  June 07, 1966 Video Participants:  [name]   History of Present Illness:  [name] is a 54 year old left-handed female with hypertension and acquired hypothyroidism who follows up for migraine.  UPDATE: Due to increased headache frequency, Aimovig was increased to 140mg  in May.  She has required a headache cocktail in May as well as 2 weeks ago.  She got a daith piercing last Friday.  This week, she has been doing well.  On average:  Intensity:moderate to severe Duration:1 to 2 hours, however it may return less intense the next day.   Frequency:2 days a week Frequency of abortive medication:once a week at most.  Rescue therapy:  Nurtec, Excedrin, promethazine Current NSAIDS:none Current analgesics:Excedrin Current triptans:none Current ergotamine:None Current anti-emetic:Promethazine 12.5 mg Current muscle relaxants:None Current anti-anxiolytic:None Current sleep aide:None Current Antihypertensive medications:Atenolol-chlorthalidone Current Antidepressant  medications:None Current Anticonvulsant medications:none Current anti-CGRP:Aimovig 140mg ; Nurtec Current Vitamins/Herbal/Supplements:None Current Antihistamines/Decongestants:None Other therapy:Caffeine for abortive therapy, daith piercing Hormone/birth control: None  She reports chronic low back pain with pain radiating down the back of her left leg down to the ankle. No preceding trauma. No weakness.  She started exercising and working out 3 days a week and she has changed her diet.  She is doing better.  Caffeine:Coffee usually only for headache treatment Alcohol:None Smoker:None Diet:Sees nutritionist. Keeps hydrated Exercise:Started exercising and working out 3 days a week with trainer. Depression:No; Anxiety:Mild.  Much better working from home.   Other pain:No Sleep hygiene:Okay  HISTORY: Onset: She has had migraines since age 19, however they have steadily gotten worse over the past year and have been daily since last week. Location: Varies(either side, holocephalic).Over the past week, she has had left sided neck pain Quality:pounding Initial Intensity:10/10; December: 4/10 mild, 10/10 severe Aura:no Prodrome:no Associated symptoms: Nausea, photophobia, sometimes vomiting Iniitial Duration:Several hours to 2 days Initial Frequency:Usually 2-3 times a month Triggers: Menstrual cycle Relieving factors: Sleep, medication Activity:Cannot function  Past NSAIDS:  Naproxen; diclofenac Past analgesics:  Tylenol; hydrocodone Past abortive triptans:  Zomig 5mg  tablet (lost efficacy); Zomig 5mg  NS (effective but nasal spray irritant); sumatriptan NS; sumatriptan 100mg ; eletriptan; Zembrace SymTouch (effective but makes her drowsy); rizatriptan 10mg  Past abortive ergotamine:  none Past muscle relaxants:  none Past anti-emetic:  none Past antihypertensive medications:  metoprolol Past antidepressant medications:  Nortriptyline  Past  anticonvulsant medications:  Trokendi XR 100mg  Past anti-CGRP:  none Past vitamins/Herbal/Supplements:  Magnesium citrate; Co-Q 10; ribolfavin Past antihistamines/decongestants:  none Other past therapies:  none   Family history of headache:No   Past Medical History: Past Medical History:  Diagnosis Date  . 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 Fibroids   .  Headache(784.0) 06/14/2007   Dr. Tomi Calhoun seeing pt. for HA management   . High blood pressure   . HYPERTENSION 06/14/2007  . Hypothyroidism 2018   thyroidectomy   . Migraines   . Nodular goiter   . Obesity   . PONV (postoperative nausea and vomiting)   . Vitamin D deficiency     Medications: Outpatient Encounter Medications as of 06/06/2020  Medication Sig  . albuterol (PROVENTIL HFA;VENTOLIN HFA) 108 (90 Base) MCG/ACT inhaler Inhale 2 puffs into the lungs every 8 (eight) hours as needed for wheezing or shortness of breath. (Patient not taking: Reported on 07/05/2019)  . atenolol-chlorthalidone (TENORETIC) 50-25 MG tablet Take by mouth.  . cholecalciferol (VITAMIN D) 1000 units tablet Take 1,000 Units by mouth daily.  [name] (AIMOVIG) 140 MG/ML SOAJ Inject 140 mg into the skin every 28 (twenty-eight) days.  [name] ibuprofen (ADVIL,MOTRIN) 200 MG tablet Take 800 mg by mouth every 8 (eight) hours as needed (for headache).  [name] levothyroxine (SYNTHROID) 125 MCG tablet Take 1 tablet (125 mcg total) by mouth daily before breakfast.  . promethazine (PHENERGAN) 12.5 MG tablet Take 1 tablet (12.5 mg total) by mouth every 8 (eight) hours as needed for nausea or vomiting.  . Rimegepant Sulfate (NURTEC) 75 MG TBDP Take 1 tablet by mouth once as needed for up to 1 dose (on onset of headaches).   No facility-administered encounter medications on file as of 06/06/2020.    Allergies: Allergies  Allergen Reactions  . No Known Allergies     Family History: Family History  Problem Relation Age of Onset  . Diabetes Mother   .  Hypertension Mother   . Thyroid disease Mother     Social History: Social History   Socioeconomic History  . Marital status: Married    Spouse name: Not on file  . Number of children: 3  . Years of education: Not on file  . Highest education level: Not on file  Occupational History  . Occupation: Airline pilot: [employer]  Tobacco Use  . Smoking status: Never Smoker  . Smokeless tobacco: Never Used  Vaping Use  . Vaping Use: Never used  Substance and Sexual Activity  . Alcohol use: No  . Drug use: No  . Sexual activity: Yes    Birth control/protection: None  Other Topics Concern  . Not on file  Social History Narrative   One level home; lives with daughter and  Husband; some college; coffee daily 1 cup occ soft drinks; exercise none   Left handed   Social Determinants of Health   Financial Resource Strain:   . Difficulty of Paying Living Expenses:   Food Insecurity:   . Worried About Charity fundraiser in the Last Year:   . Arboriculturist in the Last Year:   Transportation Needs:   . Film/video editor (Medical):   [name] Lack of Transportation (Non-Medical):   Physical Activity:   . Days of Exercise per Week:   . Minutes of Exercise per Session:   Stress:   . Feeling of Stress :   Social Connections:   . Frequency of Communication with Friends and Family:   . Frequency of Social Gatherings with Friends and Family:   . Attends Religious Services:   . Active Member of Clubs or Organizations:   . Attends Archivist Meetings:   [name] Marital Status:   Intimate Partner Violence:   . Fear of Current or Ex-Partner:   .  Emotionally Abused:   [name] Physically Abused:   . Sexually Abused:     Observations/Objective:   Height 5\' 5"  (1.651 m), weight 220 lb (99.8 kg). No acute distress.  Alert and oriented.  Speech fluent and not dysarthric.  Language intact.   Assessment and Plan:   Migraine without aura, without status migrainosus, not  intractable  1.  For preventative management, Aimovig 140mg  monthly 2.  For abortive therapy, Nurtec/Excedrin/promethazine 3.  Limit use of pain relievers to no more than 2 days out of week to prevent risk of rebound or medication-overuse headache. 4.  Keep headache diary 5.  Follow up 6 months.   Follow Up Instructions:    -I discussed the assessment and treatment plan with the patient. The patient was provided an opportunity to ask questions and all were answered. The patient agreed with the plan and demonstrated an understanding of the instructions.   The patient was advised to call back or seek an in-person evaluation if the symptoms worsen or if the condition fails to improve as anticipated.    Dudley Major, DO

## 2020-06-06 ENCOUNTER — Encounter: Payer: Self-pay | Admitting: Neurology

## 2020-06-06 ENCOUNTER — Other Ambulatory Visit: Payer: Self-pay

## 2020-06-06 ENCOUNTER — Telehealth (INDEPENDENT_AMBULATORY_CARE_PROVIDER_SITE_OTHER): Payer: BC Managed Care – PPO | Admitting: Neurology

## 2020-06-06 VITALS — Ht 65.0 in | Wt 220.0 lb

## 2020-06-06 DIAGNOSIS — G43009 Migraine without aura, not intractable, without status migrainosus: Secondary | ICD-10-CM | POA: Diagnosis not present

## 2020-07-17 ENCOUNTER — Ambulatory Visit: Payer: BC Managed Care – PPO

## 2020-07-31 ENCOUNTER — Other Ambulatory Visit (HOSPITAL_BASED_OUTPATIENT_CLINIC_OR_DEPARTMENT_OTHER): Payer: Self-pay | Admitting: Internal Medicine

## 2020-07-31 ENCOUNTER — Ambulatory Visit: Payer: BC Managed Care – PPO | Attending: Internal Medicine

## 2020-07-31 DIAGNOSIS — Z23 Encounter for immunization: Secondary | ICD-10-CM

## 2020-07-31 MED FILL — FLUARIX QUADRIVALENT 0.5 ML: 0.5 | 1 days supply | Qty: 1 | Fill #0

## 2020-07-31 NOTE — Progress Notes (Signed)
   Covid-19 Vaccination Clinic  Name:  [name]    MRN: [id number] DOB: Dec 15, 1965  07/31/2020  [name] was observed post Covid-19 immunization for 15 minutes without incident. She was provided with Vaccine Information Sheet and instruction to access the V-Safe system. Vaccinated by Kristeen Miss.  [name] was instructed to call 911 with any severe reactions post vaccine: Marland Kitchen Difficulty breathing  . Swelling of face and throat  . A fast heartbeat  . A bad rash all over body  . Dizziness and weakness   Immunizations Administered    Name Date Dose VIS Date Route   Moderna COVID-19 Vaccine 07/31/2020  2:14 PM 0.5 mL 09/2019 Intramuscular   Manufacturer: Moderna   Lot: [id number]   [place]: 00511-021-11

## 2020-08-08 MED FILL — MODERNA COVID-19 VACCINE 10: 100 | 1 days supply | Qty: 1 | Fill #0

## 2020-09-22 DIAGNOSIS — S93492A Sprain of other ligament of left ankle, initial encounter: Secondary | ICD-10-CM | POA: Diagnosis not present

## 2020-11-28 ENCOUNTER — Encounter: Payer: Self-pay | Admitting: Neurology

## 2020-11-28 NOTE — Progress Notes (Signed)
[name] (Key: [id number]) Nurtec 75MG  dispersible tablets   Form [facility] Electronic Prior Authorization Form (2017 NCPDP) Created 14 minutes ago Sent to Plan 11 minutes ago Plan Response 10 minutes ago Submit Clinical Questions 4 minutes ago Determination Favorable 1 minute ago Message from Plan Request Reference Number: [id number]. NURTEC TAB 75MG  ODT is approved through 05/28/2021. Your patient may now fill this prescription and it will be covered.

## 2020-11-29 DIAGNOSIS — E890 Postprocedural hypothyroidism: Secondary | ICD-10-CM | POA: Diagnosis not present

## 2020-11-29 DIAGNOSIS — Z6841 Body Mass Index (BMI) 40.0 and over, adult: Secondary | ICD-10-CM | POA: Diagnosis not present

## 2020-11-29 DIAGNOSIS — I10 Essential (primary) hypertension: Secondary | ICD-10-CM | POA: Diagnosis not present

## 2020-12-03 DIAGNOSIS — I10 Essential (primary) hypertension: Secondary | ICD-10-CM | POA: Diagnosis not present

## 2020-12-03 DIAGNOSIS — R7303 Prediabetes: Secondary | ICD-10-CM | POA: Diagnosis not present

## 2020-12-03 DIAGNOSIS — E890 Postprocedural hypothyroidism: Secondary | ICD-10-CM | POA: Diagnosis not present

## 2020-12-03 DIAGNOSIS — G43009 Migraine without aura, not intractable, without status migrainosus: Secondary | ICD-10-CM | POA: Diagnosis not present

## 2020-12-03 DIAGNOSIS — E559 Vitamin D deficiency, unspecified: Secondary | ICD-10-CM | POA: Diagnosis not present

## 2020-12-06 NOTE — Progress Notes (Deleted)
NEUROLOGY FOLLOW UP OFFICE NOTE  HULDAH Calhoun [id number]   Subjective:  [name] is a 55 year old left-handed female with hypertension and acquired hypothyroidism who follows up for migraines.  UPDATE: Intensity:moderate to severe Duration:1 to 2 hours, however it may return less intense the next day. Frequency:2 days a week Frequency of abortive medication:once a week at most.  Rescue therapy:  Nurtec, Excedrin, promethazine Current NSAIDS:none Current analgesics:Excedrin Current triptans:none Current ergotamine:None Current anti-emetic:Promethazine 12.5 mg Current muscle relaxants:None Current anti-anxiolytic:None Current sleep aide:None Current Antihypertensive medications:Atenolol-chlorthalidone Current Antidepressant medications:None Current Anticonvulsant medications:none Current anti-CGRP:Aimovig 140mg ; Nurtec Current Vitamins/Herbal/Supplements:None Current Antihistamines/Decongestants:None Other therapy:Caffeine for abortive therapy, daith piercing Hormone/birth control: None  She reports chronic low back pain with pain radiating down the back of her left leg down to the ankle. No preceding trauma. No weakness.She started exercising and working out 3 days a week and she has changed her diet. She is doing better.  Caffeine:Coffee usually only for headache treatment Alcohol:None Smoker:None Diet:Sees nutritionist. Keeps hydrated Exercise:Started exercising and working out 3 days a week with trainer. Depression:No; Anxiety:Mild. Much better working from home.  Other pain:No Sleep hygiene:Okay  HISTORY: Onset: She has had migraines since age 89, however they have steadily gotten worse over the past year and have been daily since last week. Location: Varies(either side, holocephalic).Over the past week, she has had left sided neck pain Quality:pounding Initial Intensity:10/10; December: 4/10 mild,  10/10 severe Aura:no Prodrome:no Associated symptoms: Nausea, photophobia, sometimes vomiting Iniitial Duration:Several hours to 2 days Initial Frequency:Usually 2-3 times a month Triggers: Menstrual cycle Relieving factors: Sleep, medication Activity:Cannot function  Past NSAIDS:Naproxen; diclofenac Past analgesics:Tylenol; hydrocodone Past abortive triptans:Zomig 5mg  tablet (lost efficacy); Zomig 5mg  NS (effective but nasal spray irritant); sumatriptan NS; sumatriptan 100mg ; eletriptan; Zembrace SymTouch (effective but makes her drowsy); rizatriptan 10mg  Past abortive ergotamine:none Past muscle relaxants:none Past anti-emetic:none Past antihypertensive medications:metoprolol Past antidepressant medications:Nortriptyline Past anticonvulsant medications:Trokendi XR 100mg  Past anti-CGRP:none Past vitamins/Herbal/Supplements:Magnesium citrate; Co-Q 10; ribolfavin Past antihistamines/decongestants:none Other past therapies:none   Family history of headache:No   PAST MEDICAL HISTORY: Past Medical History:  Diagnosis Date  . 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 Fibroids   . Headache(784.0) 06/14/2007   Dr. Tomi Likens seeing pt. for HA management   . High blood pressure   . HYPERTENSION 06/14/2007  . Hypothyroidism 2018   thyroidectomy   . Migraines   . Nodular goiter   . Obesity   . PONV (postoperative nausea and vomiting)   . Vitamin D deficiency     MEDICATIONS: Current Outpatient Medications on File Prior to Visit  Medication Sig Dispense Refill  . albuterol (PROVENTIL HFA;VENTOLIN HFA) 108 (90 Base) MCG/ACT inhaler Inhale 2 puffs into the lungs every 8 (eight) hours as needed for wheezing or shortness of breath. 1 Inhaler 0  . atenolol-chlorthalidone (TENORETIC) 50-25 MG tablet Take by mouth.    . cholecalciferol (VITAMIN D) 1000 units tablet Take 1,000 Units by mouth daily.    Eduard Roux (AIMOVIG) 140 MG/ML SOAJ Inject 140 mg  into the skin every 28 (twenty-eight) days. 1 pen 11  . ibuprofen (ADVIL,MOTRIN) 200 MG tablet Take 800 mg by mouth every 8 (eight) hours as needed (for headache).    Marland Kitchen levothyroxine (SYNTHROID) 125 MCG tablet Take 1 tablet (125 mcg total) by mouth daily before breakfast. (Patient taking differently: Take 112 mcg by mouth daily before breakfast. ) 90 tablet 1  . promethazine (PHENERGAN) 12.5 MG tablet Take 1 tablet (12.5 mg total) by mouth every 8 (eight) hours  as needed for nausea or vomiting. 90 tablet 3  . Rimegepant Sulfate (NURTEC) 75 MG TBDP Take 1 tablet by mouth once as needed for up to 1 dose (on onset of headaches). 24 tablet 3   No current facility-administered medications on file prior to visit.    ALLERGIES: Allergies  Allergen Reactions  . No Known Allergies     FAMILY HISTORY: Family History  Problem Relation Age of Onset  . Diabetes Mother   . Hypertension Mother   . Thyroid disease Mother     SOCIAL HISTORY: Social History   Socioeconomic History  . Marital status: Married    Spouse name: Not on file  . Number of children: 3  . Years of education: Not on file  . Highest education level: Not on file  Occupational History  . Occupation: Airline pilot: [employer]  Tobacco Use  . Smoking status: Never Smoker  . Smokeless tobacco: Never Used  Vaping Use  . Vaping Use: Never used  Substance and Sexual Activity  . Alcohol use: No  . Drug use: No  . Sexual activity: Yes    Birth control/protection: None  Other Topics Concern  . Not on file  Social History Narrative   One level home; lives with daughter and  Husband; some college; coffee daily 1 cup occ soft drinks; exercise none   Left handed   Social Determinants of Health   Financial Resource Strain: Not on file  Food Insecurity: Not on file  Transportation Needs: Not on file  Physical Activity: Not on file  Stress: Not on file  Social Connections: Not on file  Intimate Partner  Violence: Not on file     Objective:  *** General: No acute distress.  Patient appears well-groomed.   Head:  Normocephalic/atraumatic Eyes:  Fundi examined but not visualized Neck: supple, no paraspinal tenderness, full range of motion Heart:  Regular rate and rhythm Lungs:  Clear to auscultation bilaterally Back: No paraspinal tenderness Neurological Exam: alert and oriented to person, place, and time. Attention span and concentration intact, recent and remote memory intact, fund of knowledge intact.  Speech fluent and not dysarthric, language intact.  CN II-XII intact. Bulk and tone normal, muscle strength 5/5 throughout.  Sensation to light touch, temperature and vibration intact.  Deep tendon reflexes 2+ throughout, toes downgoing.  Finger to nose and heel to shin testing intact.  Gait normal, Romberg negative.   Assessment/Plan:   Migraine without aura, without status migrainosus, not intractable  1.  Migraine prevention:  Aimovig 140mg  2.  Migraine rescue:  *** 3.  Limit use of pain relievers to no more than 2 days out of week to prevent risk of rebound or medication-overuse headache. 4.  Keep headache diary 5.  Follow up ***  Metta Clines, DO

## 2020-12-07 ENCOUNTER — Ambulatory Visit: Payer: BC Managed Care – PPO | Admitting: Neurology

## 2020-12-25 ENCOUNTER — Telehealth: Payer: Self-pay

## 2020-12-25 NOTE — Telephone Encounter (Signed)
Refill request recevied for Promethazine tabs.  Pt last seen 11/2019. Pt needs to have an appt before she could have further refills.   Re faxed with note.

## 2021-03-25 ENCOUNTER — Other Ambulatory Visit: Payer: Self-pay | Admitting: Neurology

## 2021-03-27 ENCOUNTER — Telehealth: Payer: Self-pay | Admitting: Neurology

## 2021-03-27 DIAGNOSIS — G43009 Migraine without aura, not intractable, without status migrainosus: Secondary | ICD-10-CM

## 2021-03-27 NOTE — Telephone Encounter (Signed)
Spoke to the pt, Per she been waking up with a migraine they are getting more intense over the lat three weeks now. Pt states she takes her medication as directed and NSAID as well.   Per pt please advise.

## 2021-03-27 NOTE — Telephone Encounter (Signed)
Patient sent a message wanting an appointment due to "Migraine have started being more frequent". I scheduled for next available of 10/03/21, she would like some advice until her next appointment

## 2021-03-28 MED ORDER — UBRELVY 100 MG PO TABS: 100.0000 mg | ORAL_TABLET | ORAL | 5 refills | Status: DC | PRN

## 2021-03-28 MED ORDER — PREDNISONE 10 MG (21) PO TBPK: ORAL_TABLET | ORAL | 0 refills | Status: DC

## 2021-03-28 NOTE — Telephone Encounter (Signed)
Scripts sent to [facility] per pt.

## 2021-04-03 ENCOUNTER — Telehealth: Payer: Self-pay | Admitting: Neurology

## 2021-04-03 NOTE — Telephone Encounter (Signed)
Lysle Morales (Key: [id number]) Rx #: [id number] Roselyn Meier 100MG  tablets   Form [facility] Electronic Prior Authorization Form [phone] NCPDP) Created 3 days ago Sent to Plan 36 minutes ago Submit Clinical Questions less than a minute ago Wait for Determination Please wait for [facility] 2017 NCPDP to return a determination.

## 2021-04-29 NOTE — Progress Notes (Unsigned)
[name] (Key: [id number]) Nurtec 75MG  dispersible tablets  Waiting for Determination

## 2021-05-03 ENCOUNTER — Other Ambulatory Visit: Payer: Self-pay

## 2021-05-03 ENCOUNTER — Encounter: Payer: Self-pay | Admitting: Neurology

## 2021-05-03 ENCOUNTER — Ambulatory Visit: Payer: BC Managed Care – PPO | Admitting: Neurology

## 2021-05-03 VITALS — BP 121/77 | HR 80 | Ht 65.0 in | Wt 237.0 lb

## 2021-05-03 DIAGNOSIS — G43009 Migraine without aura, not intractable, without status migrainosus: Secondary | ICD-10-CM

## 2021-05-03 MED ORDER — EMGALITY 120 MG/ML ~~LOC~~ SOAJ: 120.0000 mg | SUBCUTANEOUS | 5 refills | Status: DC

## 2021-05-03 NOTE — Patient Instructions (Signed)
  Start Emgality - first dose is 2 injections, then 1 injection every 28 days thereafter -Take Ubrelvy.  May repeat in 2 hours.  Maximum 2 tablets in 24 hours.  If not improved, take Elyxyb (no more than 1 in 24 hours). - Another option is to take Nurtec with Elyxyb. Limit use of pain relievers to no more than 2 days out of the week.  These medications include acetaminophen, NSAIDs (ibuprofen/Advil/Motrin, naproxen/Aleve, triptans (Imitrex/sumatriptan), Excedrin, and narcotics.  This will help reduce risk of rebound headaches. Be aware of common food triggers:  - Caffeine:  coffee, black tea, cola, Mt. Dew  - Chocolate  - Dairy:  aged cheeses (brie, blue, cheddar, gouda, [place], provolone, [place], Swiss, etc), chocolate milk, buttermilk, sour cream, limit eggs and yogurt  - Nuts, peanut butter  - Alcohol  - Cereals/grains:  FRESH breads (fresh bagels, sourdough, doughnuts), yeast productions  - Processed/canned/aged/cured meats (pre-packaged deli meats, hotdogs)  - MSG/glutamate:  soy sauce, flavor enhancer, pickled/preserved/marinated foods  - Sweeteners:  aspartame (Equal, Nutrasweet).  Sugar and Splenda are okay  - Vegetables:  legumes (lima beans, lentils, snow peas, fava beans, pinto peans, peas, garbanzo beans), sauerkraut, onions, olives, pickles  - Fruit:  avocados, bananas, citrus fruit (orange, lemon, grapefruit), mango  - Other:  Frozen meals, macaroni and cheese Routine exercise Stay adequately hydrated (aim for 64 oz water daily) Keep headache diary Maintain proper stress management Maintain proper sleep hygiene Do not skip meals Consider supplements:  magnesium citrate 400mg  daily, riboflavin 400mg  daily, coenzyme Q10 100mg  three times daily.

## 2021-05-03 NOTE — Progress Notes (Signed)
NEUROLOGY FOLLOW UP OFFICE NOTE  [name] [id number]  Assessment/Plan:   Migraine without aura, without status migrainosus, not intractable - increased frequency  Migraine prevention:  Stop Aimovig.  Instead start Emgality q28d Migraine rescue:  She will try Ubrelvy, may use Elyxyb as second line.  Alternatively, she can try Nurtec with Elyxyb.  She will let me know which regimen is effective.  Promethazine for nausea. Limit use of pain relievers to no more than 2 days out of week to prevent risk of rebound or medication-overuse headache. Keep headache diary Follow up 6 months.   Subjective:  [name] is a 55 year old left-handed female with hypertension and acquired hypothyroidism who follows up for migraine.   UPDATE: In May, she started having persistent daily migraines in May.  Not sure why she has had increased frequency.  She was treating daily with multiple analgesics.  Prednisone taper helped.  They are mild to severe, lasting  and occurring 3 days a week (severe 2 days a week)  Nurtec no longer as effective.  Hasn't been able to use Ubrelvy yet because it was difficult getting it approved. Rescue therapy:  all OTC analgesics.  At night, will take Benadryl as well.   Current NSAIDS: none Current analgesics: Excedrin Current triptans: none Current ergotamine: None Current anti-emetic: Promethazine 12.5 mg Current muscle relaxants: None Current anti-anxiolytic: None Current sleep aide: None Current Antihypertensive medications: Atenolol- chlorthalidone Current Antidepressant medications: None Current Anticonvulsant medications: none Current anti-CGRP: Aimovig 140mg  Current Vitamins/Herbal/Supplements: None Current Antihistamines/Decongestants: None Other therapy: Caffeine for abortive therapy, daith piercing Hormone/birth control: None   She reports chronic low back pain with pain radiating down the back of her left leg down to the ankle.  No preceding  trauma.  No weakness.  She started exercising and working out 3 days a week and she has changed her diet.  She is doing better.   Caffeine: Coffee usually only for headache treatment Alcohol: None Smoker: None Diet: Sees nutritionist.  Keeps hydrated Exercise:Started exercising and working out 3 days a week with trainer. Depression: No; Anxiety: Mild.  Much better working from home.   Other pain: No Sleep hygiene: Okay   HISTORY:  Onset: She has had migraines since age 62, however they have steadily gotten worse over the past year and have been daily since last week. Location:  Varies(either side, holocephalic).  Over the past week, she has had left sided neck pain Quality:  pounding Initial Intensity:  10/10; December: 4/10 mild, 10/10 severe Aura:  no Prodrome:  no Associated symptoms: Nausea, photophobia, sometimes vomiting Iniitial Duration:  Several hours to 2 days Initial Frequency:  Usually 2-3 times a month Triggers: Menstrual cycle Relieving factors:  Sleep, medication Activity:  Cannot function   Past NSAIDS:  Naproxen; diclofenac Past analgesics:  Tylenol; hydrocodone Past abortive triptans:  Zomig 5mg  tablet (lost efficacy); Zomig 5mg  NS (effective but nasal spray irritant); sumatriptan NS; sumatriptan 100mg ; eletriptan; Zembrace SymTouch (effective but makes her drowsy); rizatriptan 10mg  Past abortive ergotamine:  none Past muscle relaxants:  none Past anti-emetic:  none Past antihypertensive medications:  metoprolol Past antidepressant medications:  Nortriptyline  Past anticonvulsant medications:  Trokendi XR 100mg  Past anti-CGRP:  Jonni Sanger Past vitamins/Herbal/Supplements:  Magnesium citrate; Co-Q 10; ribolfavin Past antihistamines/decongestants:  none Other past therapies:  none     Family history of headache:  No  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 Tomi Likens seeing pt. for HA management    High blood pressure    HYPERTENSION 06/14/2007   Hypothyroidism 2018   thyroidectomy    Migraines    Nodular goiter    Obesity    PONV (postoperative nausea and vomiting)    Vitamin D deficiency     MEDICATIONS: Current Outpatient Medications on File Prior to Visit  Medication Sig Dispense Refill   albuterol (PROVENTIL HFA;VENTOLIN HFA) 108 (90 Base) MCG/ACT inhaler Inhale 2 puffs into the lungs every 8 (eight) hours as needed for wheezing or shortness of breath. 1 Inhaler 0   atenolol-chlorthalidone (TENORETIC) 50-25 MG tablet Take by mouth.     cholecalciferol (VITAMIN D) 1000 units tablet Take 1,000 Units by mouth daily.     COVID-19 mRNA vaccine, Moderna, 100 MCG/0.5ML injection INJECT AS DIRECTED .25 mL 0   Erenumab-aooe (AIMOVIG) 140 MG/ML SOAJ Inject 140 mg into the skin every 28 (twenty-eight) days. 1 pen 11   ibuprofen (ADVIL,MOTRIN) 200 MG tablet Take 800 mg by mouth every 8 (eight) hours as needed (for headache).     influenza vac split quadrivalent PF (FLUARIX) 0.5 ML injection TO BE INJECTED AS DIRECTED .5 mL 0   levothyroxine (SYNTHROID) 125 MCG tablet Take 1 tablet (125 mcg total) by mouth daily before breakfast. (Patient taking differently: Take 112 mcg by mouth daily before breakfast. ) 90 tablet 1   predniSONE (STERAPRED UNI-PAK 21 TAB) 10 MG (21) TBPK tablet take 60mg  day 1, then 50mg  day 2, then 40mg  day 3, then 30mg  day 4, then 20mg  day 5, then 10mg  day 6, then STOP 21 tablet 0   promethazine (PHENERGAN) 12.5 MG tablet Take 1 tablet (12.5 mg total) by mouth every 8 (eight) hours as needed for nausea or vomiting. 90 tablet 3   Rimegepant Sulfate (NURTEC) 75 MG TBDP Take 1 tablet by mouth once as needed for up to 1 dose (on onset of headaches). 24 tablet 3   Ubrogepant (UBRELVY) 100 MG TABS Take 100 mg by mouth as needed (As needed for Mirgrine.may repeat in 2 hours, maximum 2 tablets in 24 hours). 16 tablet 5   No current  facility-administered medications on file prior to visit.    ALLERGIES: Allergies  Allergen Reactions   No Known Allergies     FAMILY HISTORY: Family History  Problem Relation Age of Onset   Diabetes Mother    Hypertension Mother    Thyroid disease Mother       Objective:  Blood pressure 121/77, pulse 80, height 5\' 5"  (1.651 m), weight 237 lb (107.5 kg), SpO2 97 %. General: No acute distress.  Patient appears well-groomed.   Head:  Normocephalic/atraumatic Eyes:  Fundi examined but not visualized Neck: supple, no paraspinal tenderness, full range of motion Heart:  Regular rate and rhythm Lungs:  Clear to auscultation bilaterally Back: No paraspinal tenderness Neurological Exam: alert and oriented to person, place, and time.  Speech fluent and not dysarthric, language intact.  CN II-XII intact. Bulk and tone normal, muscle strength 5/5 throughout.  Sensation to light touch intact.  Deep tendon reflexes 2+ throughout, toes downgoing.  Finger to nose testing intact.  Gait normal, Romberg negative.   Metta Clines, DO

## 2021-05-07 NOTE — Progress Notes (Unsigned)
Resent renewal for the pa for nurtec it was still in covermy meds. So awaiting decision   [name] (Key: [place] Bing) Aimovig 140MG /ML auto-injectors   Form [facility] Electronic Prior Authorization Form (2017 NCPDP) Created 16 hours ago Sent to Plan 1 minute ago Determination Wait for Questions [facility] 2017 NCPDP typically responds with questions in less than 15 minutes, but may take up to 24 hours.

## 2021-05-14 ENCOUNTER — Other Ambulatory Visit: Payer: Self-pay

## 2021-05-14 MED ORDER — ELYXYB 120 MG/4.8ML PO SOLN: 120.0000 mg | ORAL | 5 refills | Status: DC | PRN

## 2021-05-14 NOTE — Progress Notes (Signed)
Elxyb sent pharmacy per pt request.

## 2021-05-16 NOTE — Progress Notes (Signed)
[id number] Elyxyb PA started on paper, faxed to plan. Waiting for determination

## 2021-05-16 NOTE — Progress Notes (Unsigned)
[id number] Emgality PA started on paper faxed to plan waiting for determination

## 2021-05-17 ENCOUNTER — Other Ambulatory Visit: Payer: Self-pay

## 2021-05-17 DIAGNOSIS — G43111 Migraine with aura, intractable, with status migrainosus: Secondary | ICD-10-CM

## 2021-05-17 MED ORDER — PROMETHAZINE HCL 12.5 MG PO TABS: 12.5000 mg | ORAL_TABLET | Freq: Three times a day (TID) | ORAL | 3 refills | Status: AC | PRN

## 2021-05-17 NOTE — Progress Notes (Signed)
Refill request received from [facility] for Promethazine.  Refill sent.

## 2021-05-20 NOTE — Progress Notes (Signed)
Approval valid date 05-15-2021 through 05-15-2022. Case number [id number] to scan.

## 2021-05-20 NOTE — Progress Notes (Unsigned)
Emgality '120mg'$ /ml denied based on criteria for her plan. Sent to Dr. Tomi Likens for recommendations.What is the criteria.  She has already failed Aimovig.  If Ajovy is preferred,we an start Ajovy injection every 28 days per [facility]

## 2021-05-21 ENCOUNTER — Other Ambulatory Visit: Payer: Self-pay

## 2021-05-21 ENCOUNTER — Other Ambulatory Visit (HOSPITAL_COMMUNITY): Payer: Self-pay

## 2021-05-21 MED ORDER — AJOVY 225 MG/1.5ML ~~LOC~~ SOAJ
225.0000 mg | SUBCUTANEOUS | 1 refills | Status: DC
  Filled 2021-05-21: qty 1.5, 28d supply, fill #0

## 2021-05-22 ENCOUNTER — Other Ambulatory Visit (HOSPITAL_COMMUNITY): Payer: Self-pay

## 2021-05-23 NOTE — Progress Notes (Signed)
Eraina Hansson KeyGladstone Lighter - PA Case ID: [id number] Need help? Call us at [phone] Status Sent to [place] (fremanezumab-vfrm) injection '225MG'$ /1.5ML auto-injectors Form [facility] Electronic Prior Authorization Form (2017 NCPDP)

## 2021-05-24 ENCOUNTER — Other Ambulatory Visit (HOSPITAL_COMMUNITY): Payer: Self-pay

## 2021-05-27 ENCOUNTER — Other Ambulatory Visit (HOSPITAL_COMMUNITY): Payer: Self-pay

## 2021-05-29 DIAGNOSIS — H6502 Acute serous otitis media, left ear: Secondary | ICD-10-CM | POA: Diagnosis not present

## 2021-05-29 DIAGNOSIS — I10 Essential (primary) hypertension: Secondary | ICD-10-CM | POA: Diagnosis not present

## 2021-06-07 DIAGNOSIS — I10 Essential (primary) hypertension: Secondary | ICD-10-CM | POA: Diagnosis not present

## 2021-06-07 DIAGNOSIS — E890 Postprocedural hypothyroidism: Secondary | ICD-10-CM | POA: Diagnosis not present

## 2021-06-07 DIAGNOSIS — Z6841 Body Mass Index (BMI) 40.0 and over, adult: Secondary | ICD-10-CM | POA: Diagnosis not present

## 2021-06-12 ENCOUNTER — Telehealth: Payer: Self-pay

## 2021-06-12 NOTE — Telephone Encounter (Signed)
New message   Pending   (Key: Dimas Aguas) Roselyn Meier '100MG'$  tablets   Form [facility] Electronic Prior Authorization Form (2017 NCPDP) Created 7 days ago Sent to Plan 7 minutes ago Plan Response 7 minutes ago Submit Clinical Questions less than a minute ago Determination Wait for Determination Please wait for [facility] 2017 NCPDP to return a determination.

## 2021-06-27 ENCOUNTER — Other Ambulatory Visit: Payer: Self-pay | Admitting: Physician Assistant

## 2021-06-27 DIAGNOSIS — Z1231 Encounter for screening mammogram for malignant neoplasm of breast: Secondary | ICD-10-CM

## 2021-07-12 ENCOUNTER — Other Ambulatory Visit: Payer: Self-pay | Admitting: Neurology

## 2021-07-12 MED ORDER — ELYXYB 120 MG/4.8ML PO SOLN: 120.0000 mg | Freq: Every day | ORAL | 1 refills | Status: DC | PRN

## 2021-07-12 MED ORDER — AJOVY 225 MG/1.5ML ~~LOC~~ SOAJ: 225.0000 mg | SUBCUTANEOUS | 1 refills | Status: DC

## 2021-07-12 NOTE — Progress Notes (Signed)
Please check the status on this please.  Please advise please patient once received.

## 2021-07-12 NOTE — Progress Notes (Signed)
Will check with Gelsia on Monday, I do not see anything from insurance, tried to contact pt. No answer at 356

## 2021-07-15 ENCOUNTER — Telehealth: Payer: Self-pay

## 2021-07-15 NOTE — Telephone Encounter (Signed)
New message   Resubmit prior authorization Ubrlevy  100 mg tablets   Glendon Renbarger KeyRhea Pink - PA Case ID: [id number] Need help? Call us at [phone] Status Sent to [place] 100MG  tablets Form [facility] Electronic Prior Authorization Form [phone] NCPDP)

## 2021-07-15 NOTE — Telephone Encounter (Signed)
F/u  Outcome Denied on August 25  Request Reference Number: [id number]. UBRELVY TAB 100MG  is denied for not meeting the prior authorization requirement(s). Details of this decision are in the notice attached below or have been faxed to you. Appeals are not supported through [facility]. Please refer to the fax case notice for appeals information and instructions. Drug Roselyn Meier 100MG  tablets Form [facility] Electronic Prior Authorization Form [phone] NCPDP)

## 2021-07-18 ENCOUNTER — Telehealth: Payer: Self-pay

## 2021-07-18 NOTE — Telephone Encounter (Signed)
New message   MAURY BAMBA Key: [id number] - Rx #: [id number] Need help? Call us at [phone] Status Sent to [place] (fremanezumab-vfrm) injection 225MG /1.5ML auto-injectors Form [facility] Electronic Prior Authorization Form (2017 NCPDP)

## 2021-07-23 DIAGNOSIS — I10 Essential (primary) hypertension: Secondary | ICD-10-CM | POA: Diagnosis not present

## 2021-07-23 DIAGNOSIS — R7303 Prediabetes: Secondary | ICD-10-CM | POA: Diagnosis not present

## 2021-07-23 DIAGNOSIS — E890 Postprocedural hypothyroidism: Secondary | ICD-10-CM | POA: Diagnosis not present

## 2021-07-23 DIAGNOSIS — Z2821 Immunization not carried out because of patient refusal: Secondary | ICD-10-CM | POA: Diagnosis not present

## 2021-07-23 DIAGNOSIS — Z1211 Encounter for screening for malignant neoplasm of colon: Secondary | ICD-10-CM | POA: Diagnosis not present

## 2021-07-23 DIAGNOSIS — Z23 Encounter for immunization: Secondary | ICD-10-CM | POA: Diagnosis not present

## 2021-07-23 DIAGNOSIS — Z0000 Encounter for general adult medical examination without abnormal findings: Secondary | ICD-10-CM | POA: Diagnosis not present

## 2021-07-23 DIAGNOSIS — E559 Vitamin D deficiency, unspecified: Secondary | ICD-10-CM | POA: Diagnosis not present

## 2021-08-05 ENCOUNTER — Ambulatory Visit: Payer: BC Managed Care – PPO

## 2021-08-09 ENCOUNTER — Telehealth: Payer: Self-pay

## 2021-08-09 ENCOUNTER — Other Ambulatory Visit (HOSPITAL_COMMUNITY): Payer: Self-pay

## 2021-08-09 ENCOUNTER — Other Ambulatory Visit: Payer: Self-pay | Admitting: Neurology

## 2021-08-09 MED ORDER — ELYXYB 120 MG/4.8ML PO SOLN
120.0000 mg | Freq: Every day | ORAL | 5 refills | Status: DC | PRN
  Filled 2021-08-09: qty 28.8, 6d supply, fill #0

## 2021-08-09 NOTE — Telephone Encounter (Signed)
Please [facility] script [facility]

## 2021-08-09 NOTE — Telephone Encounter (Signed)
Per pt her insurance will cover the divalproex before they will cover the Ajovy.

## 2021-08-12 NOTE — Telephone Encounter (Signed)
Patient advised of Dr.Jaffe note below.  Will have PA Coordinator to try PA again with this information and see if they will cover Ajovy.

## 2021-08-13 ENCOUNTER — Telehealth: Payer: Self-pay

## 2021-08-13 NOTE — Telephone Encounter (Signed)
New message   [name] (Key: Gretta Cool) Aimovig 70MG /ML auto-injectors   Form [facility] Electronic Prior Authorization Form (2017 NCPDP) Created 5 days ago Sent to Plan 3 minutes ago Plan Response 3 minutes ago Submit Clinical Questions less than a minute ago Determination Wait for Determination Please wait for [facility] 2017 NCPDP to return a determination.

## 2021-08-13 NOTE — Telephone Encounter (Signed)
F/U   Fax sent to the plan  Your PA has been faxed to the plan as a paper copy. Please contact the plan directly if you haven't received a determination in a typical timeframe.  You will be notified of the determination via fax.  Lysle Morales (Key: [id number])Need help? Call us at [phone] Status Sent to Plan today Next Steps The plan will fax you a determination, typically within 1 to 5 business days.  How do I follow up? Drug AJOVY (fremanezumab-vfrm) injection 225MG /1.5ML auto-injectors Form [employer] Prior Authorization Request Form Prior Authorization for [employer] members [phone] [phone]

## 2021-08-13 NOTE — Telephone Encounter (Signed)
F/U   Lysle Morales Key: [id number] - PA Case ID: [id number] Need help? Call us at [phone]  Outcome Denied on September 26  Request Reference Number: [id number]. UBRELVY TAB 100MG  is denied for not meeting the prior authorization requirement(s). Details of this decision are in the notice attached below or have been faxed to you. Drug Roselyn Meier 100MG  tablets Form [facility] Electronic Prior Authorization Form [phone] NCPDP)

## 2021-08-13 NOTE — Telephone Encounter (Signed)
F/u   [name] (Key: [id number])Need help? Call us at [phone] Status Sent to [employer] Next Steps The plan will fax you a determination, typically within 1 to 5 business days.  How do I follow up? Drug AJOVY (fremanezumab-vfrm) injection 225MG /1.5ML auto-injectors Form [employer] Prior Authorization Request Form Prior Authorization for [employer] members [phone] [phone]

## 2021-08-20 DIAGNOSIS — R1032 Left lower quadrant pain: Secondary | ICD-10-CM | POA: Diagnosis not present

## 2021-08-20 DIAGNOSIS — R1031 Right lower quadrant pain: Secondary | ICD-10-CM | POA: Diagnosis not present

## 2021-08-26 ENCOUNTER — Telehealth: Payer: Self-pay

## 2021-08-26 NOTE — Telephone Encounter (Signed)
New message   Fax sent to the plan Your PA has been faxed to the plan as a paper copy. Please contact the plan directly if you haven't received a determination in a typical timeframe.  You will be notified of the determination via fax.   [name] (Key: [id number])Need help? Call us at [phone] Status Sent to [employer] Next Steps The plan will fax you a determination, typically within 1 to 5 business days.  How do I follow up? Drug Elyxyb 120MG /4.8ML solution Form [employer] Prior Authorization Request Form Prior Authorization for [employer] members [phone] [phone]

## 2021-08-29 NOTE — Telephone Encounter (Signed)
[name] (Key: [id number])Need help? Call us at [phone] Status Sent to [employer] Next Steps The plan will fax you a determination, typically within 1 to 5 business days.  How do I follow up? Drug AJOVY (fremanezumab-vfrm) injection 225MG /1.5ML auto-injectors Form [employer] Prior Authorization Request Form Prior Authorization for [employer] members [phone] [phone]

## 2021-08-29 NOTE — Telephone Encounter (Signed)
F/u   Fax sent to the plan Your PA has been faxed to the plan as a paper copy. Please contact the plan directly if you haven't received a determination in a typical timeframe.  You will be notified of the determination via fax.   [name] (Key: [id number])Need help? Call us at [phone] Status Sent to [employer] The plan will fax you a determination, typically within 1 to 5 business days.  How do I follow up? Drug AJOVY (fremanezumab-vfrm) injection 225MG /1.5ML auto-injectors Form [employer] Prior Authorization Request Form Prior Authorization for [employer] members [phone]

## 2021-08-30 DIAGNOSIS — R109 Unspecified abdominal pain: Secondary | ICD-10-CM | POA: Diagnosis not present

## 2021-09-02 ENCOUNTER — Telehealth: Payer: BC Managed Care – PPO | Admitting: Family Medicine

## 2021-09-02 DIAGNOSIS — R300 Dysuria: Secondary | ICD-10-CM

## 2021-09-02 MED ORDER — NITROFURANTOIN MONOHYD MACRO 100 MG PO CAPS: 100.0000 mg | ORAL_CAPSULE | Freq: Two times a day (BID) | ORAL | 0 refills | Status: AC

## 2021-09-02 NOTE — Progress Notes (Signed)

## 2021-09-06 NOTE — Telephone Encounter (Signed)
Patient called and said she is needing an update on this.  She needs her medication and has been trying to get it since July.  Patient requests a detailed message and she really wants to speak with someone.  She is happy to come by if needed.  Patient wants to know if she can get some samples in the meantime.  Cc: Jari Sportsman

## 2021-09-06 NOTE — Telephone Encounter (Signed)
Patient called to confirm correct dosage was sent to the pharmacy.   She said needs to confirm the was sent in correctly and get an update.  CC: Jari Sportsman

## 2021-09-09 ENCOUNTER — Telehealth: Payer: Self-pay

## 2021-09-09 ENCOUNTER — Encounter: Payer: Self-pay | Admitting: Neurology

## 2021-09-09 NOTE — Telephone Encounter (Signed)
New message - website CoverMyMeds  [name] (Key: [id number])Need help? Call us at [phone] Status Sent to [employer] The plan will fax you a determination, typically within 1 to 5 business days.  How do I follow up? Drug AJOVY (fremanezumab-vfrm) injection 225MG /1.5ML auto-injectors Form [employer] Prior Authorization Request Form Prior Authorization for [employer] members [phone] [phone]

## 2021-09-09 NOTE — Telephone Encounter (Signed)
Spoke to [employer]: Rep: [name] case Number [id number]. PA Pending Pa advised.  [name] the pharmacy advised pt that the PA for Elxyb not there at all can you check.

## 2021-09-11 ENCOUNTER — Other Ambulatory Visit: Payer: Self-pay | Admitting: Neurology

## 2021-09-11 ENCOUNTER — Other Ambulatory Visit (HOSPITAL_COMMUNITY): Payer: Self-pay

## 2021-09-11 MED ORDER — AJOVY 225 MG/1.5ML ~~LOC~~ SOAJ: 225.0000 mg | SUBCUTANEOUS | 1 refills | Status: DC

## 2021-09-11 NOTE — Telephone Encounter (Signed)
Correct

## 2021-09-11 NOTE — Telephone Encounter (Signed)
PA Approved starting 09/10/21.  New script sent to [facility].

## 2021-09-15 ENCOUNTER — Emergency Department (HOSPITAL_COMMUNITY): Payer: BC Managed Care – PPO

## 2021-09-15 ENCOUNTER — Other Ambulatory Visit: Payer: Self-pay

## 2021-09-15 ENCOUNTER — Emergency Department (HOSPITAL_COMMUNITY)
Admission: EM | Admit: 2021-09-15 | Discharge: 2021-09-15 | Disposition: A | Discharge status: Home / Self Care | Payer: BC Managed Care – PPO | Attending: Emergency Medicine | Admitting: Emergency Medicine

## 2021-09-15 ENCOUNTER — Emergency Department (HOSPITAL_BASED_OUTPATIENT_CLINIC_OR_DEPARTMENT_OTHER): Payer: BC Managed Care – PPO

## 2021-09-15 DIAGNOSIS — Z79899 Other long term (current) drug therapy: Secondary | ICD-10-CM | POA: Insufficient documentation

## 2021-09-15 DIAGNOSIS — R531 Weakness: Secondary | ICD-10-CM | POA: Diagnosis not present

## 2021-09-15 DIAGNOSIS — M25512 Pain in left shoulder: Secondary | ICD-10-CM

## 2021-09-15 DIAGNOSIS — I10 Essential (primary) hypertension: Secondary | ICD-10-CM | POA: Diagnosis not present

## 2021-09-15 DIAGNOSIS — E039 Hypothyroidism, unspecified: Secondary | ICD-10-CM | POA: Diagnosis not present

## 2021-09-15 DIAGNOSIS — M19012 Primary osteoarthritis, left shoulder: Secondary | ICD-10-CM | POA: Diagnosis not present

## 2021-09-15 DIAGNOSIS — M7989 Other specified soft tissue disorders: Secondary | ICD-10-CM | POA: Diagnosis not present

## 2021-09-15 DIAGNOSIS — M79602 Pain in left arm: Secondary | ICD-10-CM | POA: Insufficient documentation

## 2021-09-15 DIAGNOSIS — M2578 Osteophyte, vertebrae: Secondary | ICD-10-CM | POA: Diagnosis not present

## 2021-09-15 DIAGNOSIS — M47812 Spondylosis without myelopathy or radiculopathy, cervical region: Secondary | ICD-10-CM | POA: Diagnosis not present

## 2021-09-15 LAB — CBC WITH DIFFERENTIAL/PLATELET
Abs Immature Granulocytes: 0.04 10*3/uL (ref 0.00–0.07)
Basophils Absolute: 0.1 10*3/uL (ref 0.0–0.1)
Basophils Relative: 1 %
Eosinophils Absolute: 0.2 10*3/uL (ref 0.0–0.5)
Eosinophils Relative: 3 %
HCT: 43.8 % (ref 36.0–46.0)
Hemoglobin: 14.8 g/dL (ref 12.0–15.0)
Immature Granulocytes: 1 %
Lymphocytes Relative: 23 %
Lymphs Abs: 1.9 10*3/uL (ref 0.7–4.0)
MCH: 27.3 pg (ref 26.0–34.0)
MCHC: 33.8 g/dL (ref 30.0–36.0)
MCV: 80.7 fL (ref 80.0–100.0)
Monocytes Absolute: 0.7 10*3/uL (ref 0.1–1.0)
Monocytes Relative: 8 %
Neutro Abs: 5.3 10*3/uL (ref 1.7–7.7)
Neutrophils Relative %: 64 %
Platelets: 312 10*3/uL (ref 150–400)
RBC: 5.43 MIL/uL — ABNORMAL HIGH (ref 3.87–5.11)
RDW: 13 % (ref 11.5–15.5)
WBC: 8.1 10*3/uL (ref 4.0–10.5)
nRBC: 0 % (ref 0.0–0.2)

## 2021-09-15 LAB — BASIC METABOLIC PANEL
Anion gap: 10 (ref 5–15)
BUN: 12 mg/dL (ref 6–20)
CO2: 27 mmol/L (ref 22–32)
Calcium: 9.2 mg/dL (ref 8.9–10.3)
Chloride: 101 mmol/L (ref 98–111)
Creatinine, Ser: 0.71 mg/dL (ref 0.44–1.00)
GFR, Estimated: >60 mL/min (ref >60)
Glucose, Bld: 121 mg/dL — ABNORMAL HIGH (ref 70–99)
Potassium: 4.3 mmol/L (ref 3.5–5.1)
Sodium: 138 mmol/L (ref 135–145)

## 2021-09-15 LAB — SEDIMENTATION RATE: Sed Rate: 6 mm/hr (ref 0–22)

## 2021-09-15 LAB — C-REACTIVE PROTEIN: CRP: 0.8 mg/dL (ref <1.0)

## 2021-09-15 LAB — TROPONIN I (HIGH SENSITIVITY): Troponin I (High Sensitivity): 3 ng/L (ref <18)

## 2021-09-15 MED ORDER — GADOBUTROL 1 MMOL/ML IV SOLN
10.0000 mL | Freq: Once | INTRAVENOUS | Status: AC | PRN
  Administered 2021-09-15: 16:00:00 10 mL via INTRAVENOUS

## 2021-09-15 MED ORDER — NAPROXEN 500 MG PO TABS: 500.0000 mg | ORAL_TABLET | Freq: Two times a day (BID) | ORAL | 0 refills | Status: DC

## 2021-09-15 MED ORDER — AMOXICILLIN-POT CLAVULANATE 875-125 MG PO TABS: 1.0000 | ORAL_TABLET | Freq: Two times a day (BID) | ORAL | 0 refills | Status: DC

## 2021-09-15 MED ORDER — LORAZEPAM 2 MG/ML IJ SOLN
1.0000 mg | Freq: Once | INTRAMUSCULAR | Status: AC
  Administered 2021-09-15: 14:00:00 1 mg via INTRAVENOUS
  Filled 2021-09-15: qty 1

## 2021-09-15 MED ORDER — KETOROLAC TROMETHAMINE 15 MG/ML IJ SOLN
15.0000 mg | Freq: Once | INTRAMUSCULAR | Status: AC
  Administered 2021-09-15: 12:00:00 15 mg via INTRAVENOUS
  Filled 2021-09-15: qty 1

## 2021-09-15 MED ORDER — MORPHINE SULFATE (PF) 4 MG/ML IV SOLN
4.0000 mg | Freq: Once | INTRAVENOUS | Status: AC
  Administered 2021-09-15: 09:00:00 4 mg via INTRAVENOUS
  Filled 2021-09-15: qty 1

## 2021-09-15 NOTE — ED Provider Notes (Signed)
Patient is a 55 year old female whose care was transferred me at shift change from [facility].  Her HPI is below:  [name] is a 55 y.o. female with a past medical history significant for hypertension, hypothyroidism, vitamin D deficiency, and history of migraines who presents to the ED due to left arm pain that started early this morning.  Patient states pain occurred suddenly.  No known injury.  Pain associated with weakness.  Denies neck pain.  Patient states she was scratched on the hand recently by her dog.  Dog is up-to-date with all of his vaccines.  No fever or chills.  Denies any previous left shoulder injuries.  Patient states pain radiates from left shoulder down left arm.  Patient states while she was waiting in the waiting room, her left hand began to swell.  Patient is left-hand dominant.  No previous blood clots.  She has not tried anything for symptoms.  Pain worse with palpation and movement of arm. Denies associated chest pain and shortness of breath.   History obtained from patient, husband at bedside, and past medical records. No interpreter used during encounter.   Physical Exam  BP 112/71   Pulse 72   Temp 98.6 F (37 C) (Oral)   Resp 16   Ht $R'5\' 5"'ms$  (1.651 m)   Wt 107 kg   SpO2 98%   BMI 39.25 kg/m   Physical Exam Vitals and nursing note reviewed.  Constitutional:      General: She is not in acute distress.    Appearance: She is not ill-appearing.  HENT:     Head: Normocephalic.  Eyes:     Pupils: Pupils are equal, round, and reactive to light.  Neck:     Comments: Mild cervical midline tenderness Cardiovascular:     Rate and Rhythm: Normal rate and regular rhythm.     Pulses: Normal pulses.     Heart sounds: Normal heart sounds. No murmur heard.   No friction rub. No gallop.  Pulmonary:     Effort: Pulmonary effort is normal.     Breath sounds: Normal breath sounds.  Abdominal:     General: Abdomen is flat. There is no distension.     Palpations:  Abdomen is soft.     Tenderness: There is no abdominal tenderness. There is no guarding or rebound.  Musculoskeletal:        General: Normal range of motion.     Cervical back: Neck supple.     Comments: Tenderness throughout left arm. Mild edema to left fingers. Radial pulse intact. Decreased ROM of left shoulder secondary to pain.  Skin:    General: Skin is warm and dry.  Neurological:     General: No focal deficit present.     Mental Status: She is alert.  Psychiatric:        Mood and Affect: Mood normal.        Behavior: Behavior normal.  ED Course/Procedures     Procedures  MDM  Patient is a 56 year old female whose care was transferred me at shift change from prior PA-C.  Please see her note below for additional information.  In summary, she presents to the emergency department due to atraumatic left upper extremity pain.  Patient states that she also began developing swelling in the left hand and notes a recent dog scratch to the left hand.  No fevers, chills.  No history of blood clots.  Patient has tenderness in the left upper extremity from the  shoulder to the elbow.  No erythema.  Mild cervical midline spine tenderness.  Ultrasound is resulted and is negative for DVT.  Troponin is normal.  EKG with normal sinus rhythm.  CBC without leukocytosis.  BMP reassuring.  Normal renal function.  CRP and ESR appear within normal limits.  At the time of shift change patient pending MRI of her left shoulder and cervical spine.  These images have since resulted with findings as noted below.  IMPRESSION:  1. No signs of left shoulder infection or definite acute process.  2. Supraspinatus, infraspinatus and bicipital tendinosis without  evidence of tear.  3. Detail limited by motion and body habitus.   IMPRESSION:  Mildly motion degraded exam.     No MR evidence of infection within the cervical spine.     Cervical spondylosis, as outlined and with findings most notably as  follows.      At C3-C4, a shallow broad-based central disc protrusion contacts and  mildly flattens the ventral spinal cord. However, the dorsal CSF  space is maintained within the spinal canal. Multifactorial  bilateral neural foraminal narrowing at this level (moderate/severe  right, mild left).     No more than mild relative spinal canal narrowing at the remaining  levels. Additional sites of foraminal stenosis, as detailed and  greatest bilaterally at C4-C5 (severe right, moderate/severe left)  and bilaterally at C5-C6 (moderate).     Disc degeneration is greatest at C3-C4, C4-C5 and C5-C6 (moderate at  these levels). Mild degenerative endplate edema at F7-C9 and C5-C6.     Reversal of the expected cervical lordosis.     Incidentally noted right maxillary sinus mucous retention cyst.   Patient reassessed.  She states that her pain has moderately improved.  On reevaluation she still has some mild to moderate tenderness along the left anterior shoulder.  Difficult to assess range of motion due to her pain.  She is left-hand-dominant.  She notes just prior to the onset of her symptoms last night she was folding clothes and went to lie down and then began suddenly experiencing the left shoulder pain.  States that it starts in the left shoulder and radiates downwards and stops at about the mid forearm.  No overlying skin changes on my exam.  No rashes.  Unsure the source of her symptoms.  MRI does show some shallow broad-based central disc protrusion at C3-4.  Possibly a radiculopathy, though the pain appears to start at her shoulder.  Patient has minimal neck pain.    Previous provider prescribed patient naproxen as well as Augmentin.    Feel the patient is stable for discharge at this time and she is agreeable.  Recommended PCP follow-up in the next 1 to 2 weeks.  Discussed return precautions.  Her questions were answered and she was amicable at the time of discharge.     Rayna Sexton,  PA-C 09/15/21 1704    Amber Fuse, MD 09/18/21 1434

## 2021-09-15 NOTE — Discharge Instructions (Addendum)
You have been prescribed 2 medications.  The first medication is an antibiotic called Augmentin.  Please take this twice a day for the next 7 days.  Do not stop taking this early.  You have also been prescribed an anti-inflammatory medication called naproxen.  This is similar to Aleve.  You can take this up to 2 times a day for management of your pain.  Try to take it with a small amount of food to help prevent stomach irritation.  Please follow-up with your regular doctor in the next 1 to 2 weeks.  If you develop any new or worsening symptoms please come back to the emergency department.

## 2021-09-15 NOTE — ED Triage Notes (Signed)
Pt c/o left arm pain and numbness.

## 2021-09-15 NOTE — ED Provider Notes (Signed)
[facility] EMERGENCY DEPARTMENT Provider Note   CSN: [id number] Arrival date & time: 09/15/21  5625     History Chief Complaint  Patient presents with   Arm Pain    [name] is a 55 y.o. female with a past medical history significant for hypertension, hypothyroidism, vitamin D deficiency, and history of migraines who presents to the ED due to left arm pain that started early this morning.  Patient states pain occurred suddenly.  No known injury.  Pain associated with weakness.  Denies neck pain.  Patient states she was scratched on the hand recently by her dog.  Dog is up-to-date with all of his vaccines.  No fever or chills.  Denies any previous left shoulder injuries.  Patient states pain radiates from left shoulder down left arm.  Patient states while she was waiting in the waiting room, her left hand began to swell.  Patient is left-hand dominant.  No previous blood clots.  She has not tried anything for symptoms.  Pain worse with palpation and movement of arm. Denies associated chest pain and shortness of breath.  History obtained from patient, husband at bedside, and past medical records. No interpreter used during encounter.       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 Tomi Likens seeing pt. 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    DILATION AND CURETTAGE OF UTERUS     x2   HYSTERECTOMY ABDOMINAL WITH SALPINGO-OOPHORECTOMY N/A 01/26/2018   Procedure: SUPRACERVICAL HYSTERECTOMY ABDOMINAL WITH SALPINGO-OOPHORECTOMY;  Surgeon: [name], MD;  Location: [facility];  Service: Gynecology;  Laterality: N/A;   LYSIS OF ADHESION N/A 01/26/2018   Procedure: EXTENSIVE LYSIS OF ADHESIONS;  Surgeon: [name], MD;  Location: [facility];  Service: Gynecology;  Laterality: N/A;   THYROIDECTOMY  11/18/2016   THYROIDECTOMY N/A 11/18/2016   Procedure: TOTAL THYROIDECTOMY;  Surgeon: Armandina Gemma, MD;  Location: [facility];  Service: General;  Laterality: N/A;   THYROIDECTOMY       OB History     Gravida  3   Para      Term      Preterm      AB  1   Living  2      SAB  1   IAB      Ectopic      Multiple      Live Births  Family History  Problem Relation Age of Onset   Diabetes Mother    Hypertension Mother    Thyroid disease Mother     Social History   Tobacco Use   Smoking status: Never   Smokeless tobacco: Never  Vaping Use   Vaping Use: Never used  Substance Use Topics   Alcohol use: No   Drug use: No    Home Medications Prior to Admission medications   Medication Sig Start Date End Date Taking? Authorizing Provider  amoxicillin-clavulanate (AUGMENTIN) 875-125 MG tablet Take 1 tablet by mouth every 12 (twelve) hours. 09/15/21  Yes Abdiaziz Klahn, Druscilla Brownie, PA-C  aspirin-acetaminophen-caffeine (EXCEDRIN MIGRAINE) [phone] MG tablet Take 2 tablets by mouth every 6 (six) hours as needed for headache.   Yes [provider]   atenolol-chlorthalidone (TENORETIC) 50-25 MG tablet Take by mouth. 03/09/19  Yes [provider]  ibuprofen (ADVIL,MOTRIN) 200 MG tablet Take 800 mg by mouth every 8 (eight) hours as needed (for headache).   Yes [provider]  levothyroxine (SYNTHROID) 112 MCG tablet Take 112 mcg by mouth daily before breakfast.   Yes [provider]  naproxen (NAPROSYN) 500 MG tablet Take 1 tablet (500 mg total) by mouth 2 (two) times daily. 09/15/21  Yes Crystel Demarco, Druscilla Brownie, PA-C  promethazine (PHENERGAN) 12.5 MG tablet Take 1 tablet (12.5 mg total) by mouth every 8 (eight) hours as needed for nausea or vomiting. 05/17/21  Yes Tomi Likens, Adam R, DO  Rimegepant Sulfate (NURTEC) 75 MG TBDP Take 1 tablet by mouth once as needed for up to 1 dose (on onset of headaches). 12/06/19  Yes Jaffe, Adam R, DO  Ubrogepant (UBRELVY) 100 MG TABS Take 100 mg by mouth as needed (As needed for Mirgrine.may repeat in 2 hours, maximum 2 tablets in 24 hours). 03/28/21  Yes Jaffe, Adam R, DO  Fremanezumab-vfrm (AJOVY) 225 MG/1.5ML SOAJ Inject 225 mg into the skin every 28 (twenty-eight) days. Patient not taking: Reported on 09/15/2021 09/11/21   Pieter Partridge, DO  Galcanezumab-gnlm (EMGALITY) 120 MG/ML SOAJ Inject 120 mg into the skin every 28 (twenty-eight) days. Patient not taking: Reported on 09/15/2021 05/03/21   Pieter Partridge, DO    Allergies    No known allergies  Review of Systems   Review of Systems  Constitutional:  Negative for chills and fever.  Respiratory:  Negative for shortness of breath.   Cardiovascular:  Negative for chest pain.  Musculoskeletal:  Positive for arthralgias and joint swelling.  Neurological:  Positive for weakness. Negative for numbness.   Physical Exam Updated Vital Signs BP 112/71   Pulse 72   Temp 98.6 F (37 C) (Oral)   Resp 16   Ht $R'5\' 5"'FL$  (1.651 m)   Wt 107 kg   SpO2 98%   BMI 39.25 kg/m   Physical Exam Vitals and nursing note reviewed.  Constitutional:       General: She is not in acute distress.    Appearance: She is not ill-appearing.  HENT:     Head: Normocephalic.  Eyes:     Pupils: Pupils are equal, round, and reactive to light.  Neck:     Comments: Mild cervical midline tenderness Cardiovascular:     Rate and Rhythm: Normal rate and regular rhythm.     Pulses: Normal pulses.     Heart sounds: Normal heart sounds. No murmur heard.   No friction rub. No gallop.  Pulmonary:     Effort: Pulmonary effort is normal.  Breath sounds: Normal breath sounds.  Abdominal:     General: Abdomen is flat. There is no distension.     Palpations: Abdomen is soft.     Tenderness: There is no abdominal tenderness. There is no guarding or rebound.  Musculoskeletal:        General: Normal range of motion.     Cervical back: Neck supple.     Comments: Tenderness throughout left arm. Mild edema to left fingers. Radial pulse intact. Decreased ROM of left shoulder secondary to pain.  Skin:    General: Skin is warm and dry.  Neurological:     General: No focal deficit present.     Mental Status: She is alert.  Psychiatric:        Mood and Affect: Mood normal.        Behavior: Behavior normal.    ED Results / Procedures / Treatments   Labs (all labs ordered are listed, but only abnormal results are displayed) Labs Reviewed  CBC WITH DIFFERENTIAL/PLATELET - Abnormal; Notable for the following components:      Result Value   RBC 5.43 (*)    All other components within normal limits  BASIC METABOLIC PANEL - Abnormal; Notable for the following components:   Glucose, Bld 121 (*)    All other components within normal limits  SEDIMENTATION RATE  C-REACTIVE PROTEIN  TROPONIN I (HIGH SENSITIVITY)    EKG EKG Interpretation  Date/Time:  Sunday September 15 2021 09:26:41 EST Ventricular Rate:  71 PR Interval:  149 QRS Duration: 92 QT Interval:  396 QTC Calculation: 431 R Axis:   35 Text Interpretation: Sinus rhythm Benign early  repolarization Confirmed by Regan Lemming ([id number]) on 09/15/2021 10:50:46 AM  Radiology DG Shoulder Left  Result Date: 09/15/2021 CLINICAL DATA:  Left shoulder pain. EXAM: LEFT SHOULDER - 2+ VIEW COMPARISON:  Chest radiograph dated 11/13/2016. FINDINGS: There is no acute fracture or dislocation. Mild arthritic changes of the left AC joint. The soft tissues are unremarkable. IMPRESSION: No acute fracture or dislocation. Electronically Signed   By: Anner Crete M.D.   On: 09/15/2021 03:09   UE VENOUS DUPLEX (7am - 7pm)  Result Date: 09/15/2021 UPPER VENOUS STUDY  Patient Name:  RAIVYN KABLER  Date of Exam:   09/15/2021 Medical Rec #: [id number]         Accession #:    [id number] Date of Birth: 1966-04-14         Patient Gender: F Patient Age:   23 years Exam Location:  [facility] Procedure:      VAS [name] UPPER EXTREMITY VENOUS DUPLEX Referring Phys: Charmaine Downs --------------------------------------------------------------------------------  Indications: left shoulder pain Limitations: Body habitus, poor ultrasound/tissue interface and patient unable to move arm due to pain. Comparison Study: No previous exams Performing Technologist: Jody Hill RVT, RDMS  Examination Guidelines: A complete evaluation includes B-mode imaging, spectral Doppler, color Doppler, and power Doppler as needed of all accessible portions of each vessel. Bilateral testing is considered an integral part of a complete examination. Limited examinations for reoccurring indications may be performed as noted.  Right Findings: +----------+------------+---------+-----------+----------+-------+ RIGHT     CompressiblePhasicitySpontaneousPropertiesSummary +----------+------------+---------+-----------+----------+-------+ Subclavian    Full       Yes       Yes                      +----------+------------+---------+-----------+----------+-------+  Left Findings:  +----------+------------+---------+-----------+----------+-------+ LEFT      CompressiblePhasicitySpontaneousPropertiesSummary +----------+------------+---------+-----------+----------+-------+ IJV  Full       Yes       Yes                      +----------+------------+---------+-----------+----------+-------+ Subclavian    Full       Yes       Yes                      +----------+------------+---------+-----------+----------+-------+ Axillary      Full       Yes       Yes                      +----------+------------+---------+-----------+----------+-------+ Brachial      Full                                          +----------+------------+---------+-----------+----------+-------+ Radial        Full                                          +----------+------------+---------+-----------+----------+-------+ Ulnar         Full                                          +----------+------------+---------+-----------+----------+-------+ Cephalic      Full                                          +----------+------------+---------+-----------+----------+-------+ Basilic       Full       Yes       Yes                      +----------+------------+---------+-----------+----------+-------+  *See table(s) above for measurements and observations.    Preliminary     Procedures Procedures   Medications Ordered in ED Medications  morphine 4 MG/ML injection 4 mg (4 mg Intravenous Given 09/15/21 0830)  ketorolac (TORADOL) 15 MG/ML injection 15 mg (15 mg Intravenous Given 09/15/21 1135)  LORazepam (ATIVAN) injection 1 mg (1 mg Intravenous Given 09/15/21 1428)    ED Course  I have reviewed the triage vital signs and the nursing notes.  Pertinent labs & imaging results that were available during my care of the patient were reviewed by me and considered in my medical decision making (see chart for details).    MDM Rules/Calculators/A&P                            55 year old female presents to the ED due to atraumatic left upper extremity pain.  While waiting in the waiting room, patient developed swelling to her left hand.  Patient had a recent dog scratch to left hand.  No fever or chills.  No previous blood clots.  No known injury to left upper extremity.  Unfortunately, patient waited over 6 hours prior to my initial evaluation.  Upon arrival, stable vitals.  Patient in no acute distress.  Tenderness throughout left upper extremity from shoulder to elbow.  Mild edema  of left hand.  No erythema surrounding dog scratch.  Very mild cervical midline tenderness.  Lower suspicion for central cord compression.  Will obtain labs, including cardiac labs to rule out cardiac etiology of arm pain. ultrasound to rule out blood clot given edema. Morphine given for pain management.  X-ray ordered at triage which I personally reviewed which is negative for any bony fractures or acute abnormalities.   Ultrasound negative for DVT.  Troponin normal.  EKG demonstrates normal sinus rhythm.  No signs of acute ischemia.  Possible early repolarization.  CBC with no leukocytosis.  BMP reassuring.  Normal renal function.  No major electrolyte derangements.  Low suspicion for atypical cardiac etiology.    10:57 AM reassessed patient and she notes continued pain with no improvement after Toradol and morphine. After further discussion with Dr. Armandina Gemma who also evaluated patient at bedside, we decided to order MRI shoulder and cervical spine to rule out infection given recent scratch to left hand. ESR and CRP ordered. Dilaudid given.   CRP and ESR normal.   Patient handed off to [facility], PA-C at shift change pending MRI shoulder and cervical spine. If unremarkable, patient may be discharged with pain medication and orthopedic follow-up.  Final Clinical Impression(s) / ED Diagnoses Final diagnoses:  Left arm pain    Rx / DC Orders ED Discharge Orders           Ordered    naproxen (NAPROSYN) 500 MG tablet  2 times daily        09/15/21 1052    amoxicillin-clavulanate (AUGMENTIN) 875-125 MG tablet  Every 12 hours        09/15/21 1052             Karie Kirks 09/15/21 1514    Regan Lemming, MD 09/15/21 1756

## 2021-09-15 NOTE — Progress Notes (Signed)
LUE venous duplex has been completed.  Preliminary results messaged to Dr. Nunzio Cobbs via secure chat.  Results can be found under chart review under CV PROC. 09/15/2021 9:24 AM Karan Inclan RVT, RDMS

## 2021-09-15 NOTE — ED Notes (Signed)
MRI called to inform they are keeping the pt for 30 to monitor her due to what might be an allergic reaction to the contract. States she has some arm burning and pain.  Will update the daughter

## 2021-09-15 NOTE — ED Provider Notes (Signed)
Emergency Medicine Provider Triage Evaluation Note  [name] , a 55 y.o. female  was evaluated in triage.  Pt complains of left shoulder pain that started 45 mins ago.  States starts in shoulder and radiates down to the elbow.  Denies injury/trauma.  No chest pain or SOB.  No fever.  No hx of same.  She is left hand dominant..  Review of Systems  Positive: Left arm pain Negative: Chest pain, SOB  Physical Exam  BP (!) 156/88 (BP Location: Right Wrist)   Pulse 88   Temp 98.4 F (36.9 C) (Oral)   Resp 18   Ht 5\' 5"  (1.651 m)   Wt 107 kg   SpO2 98%   BMI 39.25 kg/m  Gen:   Awake, no distress   Resp:  Normal effort  MSK:   Moves extremities without difficulty  Other:  Very tender diffusely throughout left shoulder and upper arm, no tenderness from elbow down, normal grips, no neck tenderness  Medical Decision Making  Medically screening exam initiated at 2:26 AM.  Appropriate orders placed.  Kendra L Pinela was informed that the remainder of the evaluation will be completed by another provider, this initial triage assessment does not replace that evaluation, and the importance of remaining in the ED until their evaluation is complete.  Atraumatic left shoulder pain.  Denies chest pain or SOB.  Very tender to left shoulder on exam.  VSS.  Will obtain x-ray.   Larene Pickett, PA-C 09/15/21 0228    Maudie Flakes, MD 09/15/21 [phone]

## 2021-09-15 NOTE — Progress Notes (Signed)
Pt brought to MRI for exams via pt transport. Pt verbally safety screened and prepared for exam. Pt requested meds for claustro upon attempting to enter scanner. Contacted RN for meds. RN administered meds. Pt given alert squeeze ball and began scanning. Completed pre-contrast imaging. Immediately following contrast administration pt complained of lingering burning sensation radiating up arm. No sign of extravasation. Placed cool washcloth on site which pt stated soothed the sensation. Consulted radiologist (Dr Armandina Gemma) for possible contrast reaction. Pt stated no other symptoms. Rad assessed pt and stated to monitor pt for 45 minutes following completion of exam. Stated possible adverse reaction to contrast. Completed post contrast imaging. Transferred pt back to stretcher to monitor in pt bay for 45 min beginning at 1600 and ending at [place]. RN made aware. Documented reaction. At this time, pt states burning sensation has subsided.

## 2021-09-16 NOTE — Telephone Encounter (Addendum)
Refax the form to [phone]  Send Request to Plan Warning Our records indicate that this request has already been sent to the patient's insurance plan. To follow up, please call the plan directly.  [name] (Key: [id number])Need help? Call us at [phone]  Status Sent to [facility] [place] November 7  Next Steps The plan will fax you a determination, typically within 1 to 5 business days.  How do I follow up? Drug Elyxyb 120MG /4.8ML solution Form [employer] Prior Authorization Request Form Prior Authorization for [employer] members [phone] [phone]

## 2021-09-17 ENCOUNTER — Other Ambulatory Visit: Payer: Self-pay

## 2021-09-17 ENCOUNTER — Ambulatory Visit (INDEPENDENT_AMBULATORY_CARE_PROVIDER_SITE_OTHER): Payer: BC Managed Care – PPO | Admitting: Orthopaedic Surgery

## 2021-09-17 DIAGNOSIS — M7502 Adhesive capsulitis of left shoulder: Secondary | ICD-10-CM | POA: Diagnosis not present

## 2021-09-17 MED ORDER — LIDOCAINE HCL 1 % IJ SOLN
4.0000 mL | INTRAMUSCULAR | Status: AC | PRN
Start: 2021-09-17 — End: 2021-09-17
  Administered 2021-09-17: 4 mL

## 2021-09-17 MED ORDER — TRIAMCINOLONE ACETONIDE 40 MG/ML IJ SUSP
80.0000 mg | INTRAMUSCULAR | Status: AC | PRN
  Administered 2021-09-17: 80 mg via INTRA_ARTICULAR

## 2021-09-17 NOTE — Progress Notes (Signed)
Chief Complaint: left shoulder pain     History of Present Illness:   [name] is a 55 y.o. female LHD presents with left shoulder pain atraumatic in nature since 27th November.  She states that she woke up with a significant sharp pain in the shoulder she has a history of hypothyroidism.  She says that she is unable to directly lie on the side.  This is her dominant arm and is significantly affecting all of her activities of daily living.  She has been taking naproxen which does not help.  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    Surgical History:   None  PMH/PSH/Family History/Social History/Meds/Allergies:    Past Medical History:  Diagnosis Date  . 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 Fibroids   . Headache(784.0) 06/14/2007   Dr. Tomi Likens seeing pt. for HA management   . High blood pressure   . HYPERTENSION 06/14/2007  . Hypothyroidism 2018   thyroidectomy   . Migraines   . Nodular goiter   . Obesity   . PONV (postoperative nausea and vomiting)   . Vitamin D deficiency    Past Surgical History:  Procedure Laterality Date  . ABDOMINAL HYSTERECTOMY    . [place]   x2, with epidural & general anesth.   Marland Kitchen DILATION AND CURETTAGE OF UTERUS     x2  . HYSTERECTOMY ABDOMINAL WITH SALPINGO-OOPHORECTOMY N/A 01/26/2018   Procedure: SUPRACERVICAL HYSTERECTOMY ABDOMINAL WITH SALPINGO-OOPHORECTOMY;  Surgeon: [name], MD;  Location: [facility];  Service: Gynecology;  Laterality: N/A;  . LYSIS OF ADHESION N/A 01/26/2018   Procedure: EXTENSIVE LYSIS OF ADHESIONS;  Surgeon: [name], MD;  Location: [facility];  Service: Gynecology;  Laterality: N/A;  . THYROIDECTOMY  11/18/2016  . THYROIDECTOMY N/A 11/18/2016   Procedure: TOTAL THYROIDECTOMY;  Surgeon: Armandina Gemma, MD;  Location: [facility];  Service: General;  Laterality: N/A;  . THYROIDECTOMY     Social History   Socioeconomic History  . Marital status: Married    Spouse  name: Not on file  . Number of children: 3  . Years of education: Not on file  . Highest education level: Not on file  Occupational History  . Occupation: Airline pilot: [employer]  Tobacco Use  . Smoking status: Never  . Smokeless tobacco: Never  Vaping Use  . Vaping Use: Never used  Substance and Sexual Activity  . Alcohol use: No  . Drug use: No  . Sexual activity: Yes    Birth control/protection: None  Other Topics Concern  . Not on file  Social History Narrative   One level home; lives with daughter and  Husband; some college; coffee daily 1 cup occ soft drinks; exercise none   Left handed   Social Determinants of Health   Financial Resource Strain: Not on file  Food Insecurity: Not on file  Transportation Needs: Not on file  Physical Activity: Not on file  Stress: Not on file  Social Connections: Not on file   Family History  Problem Relation Age of Onset  . Diabetes Mother   . Hypertension Mother   . Thyroid disease Mother    Allergies  Allergen Reactions  . No Known Allergies   . Gadolinium Derivatives Other (See Comments)  Temporary burning sensation radiating up arm   Current Outpatient Medications  Medication Sig Dispense Refill  . amoxicillin-clavulanate (AUGMENTIN) 875-125 MG tablet Take 1 tablet by mouth every 12 (twelve) hours. 14 tablet 0  . aspirin-acetaminophen-caffeine (EXCEDRIN MIGRAINE) 250-250-65 MG tablet Take 2 tablets by mouth every 6 (six) hours as needed for headache.    Marland Kitchen atenolol-chlorthalidone (TENORETIC) 50-25 MG tablet Take by mouth.    . Fremanezumab-vfrm (AJOVY) 225 MG/1.5ML SOAJ Inject 225 mg into the skin every 28 (twenty-eight) days. (Patient not taking: Reported on 09/15/2021) 5.04 mL 1  . Galcanezumab-gnlm (EMGALITY) 120 MG/ML SOAJ Inject 120 mg into the skin every 28 (twenty-eight) days. (Patient not taking: Reported on 09/15/2021) 1.12 mL 5  . ibuprofen (ADVIL,MOTRIN) 200 MG tablet Take 800 mg by mouth  every 8 (eight) hours as needed (for headache).    Marland Kitchen levothyroxine (SYNTHROID) 112 MCG tablet Take 112 mcg by mouth daily before breakfast.    . naproxen (NAPROSYN) 500 MG tablet Take 1 tablet (500 mg total) by mouth 2 (two) times daily. 30 tablet 0  . promethazine (PHENERGAN) 12.5 MG tablet Take 1 tablet (12.5 mg total) by mouth every 8 (eight) hours as needed for nausea or vomiting. 90 tablet 3  . Rimegepant Sulfate (NURTEC) 75 MG TBDP Take 1 tablet by mouth once as needed for up to 1 dose (on onset of headaches). 24 tablet 3  . Ubrogepant (UBRELVY) 100 MG TABS Take 100 mg by mouth as needed (As needed for Mirgrine.may repeat in 2 hours, maximum 2 tablets in 24 hours). 16 tablet 5   No current facility-administered medications for this visit.   No results found.  Review of Systems:   A ROS was performed including pertinent positives and negatives as documented in the HPI.  Physical Exam :   Constitutional: NAD and appears stated age Neurological: Alert and oriented Psych: Appropriate affect and cooperative There were no vitals taken for this visit.   Comprehensive Musculoskeletal Exam:    Musculoskeletal Exam    Inspection Right Left  Skin No atrophy or winging No atrophy or winging  Palpation    Tenderness None Glenohumeral  Range of Motion    Flexion (passive) 170 40  Flexion (active) 170 30  Abduction 170 30  ER at the side 70 10  Can reach behind back to T12 From pocket  Strength     Full Painful  Special Tests    Pseudoparalytic No No  Neurologic    Fires PIN, radial, median, ulnar, musculocutaneous, axillary, suprascapular, long thoracic, and spinal accessory innervated muscles. No abnormal sensibility  Vascular/Lymphatic    Radial Pulse 2+ 2+  Cervical Exam    Patient has symmetric cervical range of motion with negative Spurling's test.  Special Test: Pain with passive external rotation     Imaging:   Xray (3 views left shoulder): Normal    I personally  reviewed and interpreted the radiographs.   Assessment:   56 year old female with left shoulder pain consistent with adhesive capsulitis.  Today I would like to perform an ultrasound-guided left steroid glenohumeral injection.  I have advised that she should take it easy and not stress the shoulder in terms of motion.  She may begin gentle wall climbs and forward elevation and abduction.  I will see her back in 2 weeks for reassessment we will consider additional injection at that time  Plan :    -Plan for left shoulder ultrasound-guided injection for adhesive capsulitis    Procedure Note  Patient: [name]             Date of Birth: [birth year]           MRN: [id number]             Visit Date: 09/17/2021  Procedures: Visit Diagnoses: No diagnosis found.  Large Joint Inj: L glenohumeral on 09/17/2021 4:48 PM Indications: pain Details: 22 G 1.5 in needle, ultrasound-guided anterior approach  Arthrogram: No  Medications: 4 mL lidocaine 1 %; 80 mg triamcinolone acetonide 40 MG/ML Outcome: tolerated well, no immediate complications Procedure, treatment alternatives, risks and benefits explained, specific risks discussed. Consent was given by the patient. Immediately prior to procedure a time out was called to verify the correct patient, procedure, equipment, support staff and site/side marked as required. Patient was prepped and draped in the usual sterile fashion.         I personally saw and evaluated the patient, and participated in the management and treatment plan.  Vanetta Mulders, MD Attending Physician, Orthopedic Surgery  This document was dictated using Dragon voice recognition software. A reasonable attempt at proof reading has been made to minimize errors.

## 2021-10-01 ENCOUNTER — Ambulatory Visit (HOSPITAL_BASED_OUTPATIENT_CLINIC_OR_DEPARTMENT_OTHER): Payer: BC Managed Care – PPO | Admitting: Orthopaedic Surgery

## 2021-10-01 ENCOUNTER — Other Ambulatory Visit: Payer: Self-pay

## 2021-10-01 DIAGNOSIS — M7502 Adhesive capsulitis of left shoulder: Secondary | ICD-10-CM

## 2021-10-01 DIAGNOSIS — M25512 Pain in left shoulder: Secondary | ICD-10-CM

## 2021-10-01 MED ORDER — TRIAMCINOLONE ACETONIDE 40 MG/ML IJ SUSP
80.0000 mg | INTRAMUSCULAR | Status: AC | PRN
  Administered 2021-10-01: 80 mg via INTRA_ARTICULAR

## 2021-10-01 MED ORDER — LIDOCAINE HCL 1 % IJ SOLN
4.0000 mL | INTRAMUSCULAR | Status: AC | PRN
  Administered 2021-10-01: 4 mL

## 2021-10-01 NOTE — Progress Notes (Signed)
Chief Complaint: left shoulder pain     History of Present Illness:   10/01/2021: Today for follow-up on the left shoulder.  Overall she feels dramatically better.  She is able to move the shoulder more.  [name] is a 55 y.o. female LHD presents with left shoulder pain atraumatic in nature since 27th November.  She states that she woke up with a significant sharp pain in the shoulder she has a history of hypothyroidism.  She says that she is unable to directly lie on the side.  This is her dominant arm and is significantly affecting all of her activities of daily living.  She has been taking naproxen which does not help.  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 Tomi Likens seeing pt. for HA management    High blood pressure    HYPERTENSION 06/14/2007   Hypothyroidism 2018   thyroidectomy    Migraines    Nodular goiter    Obesity    PONV (postoperative nausea and vomiting)    Vitamin D deficiency    Past Surgical History:  Procedure Laterality Date   ABDOMINAL HYSTERECTOMY     [place]   x2, with epidural & general anesth.    DILATION AND CURETTAGE OF UTERUS     x2   HYSTERECTOMY ABDOMINAL WITH SALPINGO-OOPHORECTOMY N/A 01/26/2018   Procedure: SUPRACERVICAL HYSTERECTOMY ABDOMINAL WITH SALPINGO-OOPHORECTOMY;  Surgeon: [name], MD;  Location: [facility];  Service: Gynecology;  Laterality: N/A;   LYSIS OF ADHESION N/A 01/26/2018   Procedure: EXTENSIVE LYSIS OF ADHESIONS;  Surgeon: [name], MD;  Location: [facility];  Service: Gynecology;  Laterality: N/A;   THYROIDECTOMY  11/18/2016   THYROIDECTOMY N/A 11/18/2016   Procedure: TOTAL THYROIDECTOMY;  Surgeon: Armandina Gemma, MD;  Location: [facility];  Service: General;   Laterality: N/A;   THYROIDECTOMY     Social History   Socioeconomic History   Marital status: Married    Spouse name: Not on file   Number of children: 3   Years of education: Not on file   Highest education level: Not on file  Occupational History   Occupation: Airline pilot: [employer]  Tobacco Use   Smoking status: Never   Smokeless tobacco: Never  Vaping Use   Vaping Use: Never used  Substance and Sexual Activity   Alcohol use: No   Drug use: No   Sexual activity: Yes    Birth control/protection: None  Other Topics Concern   Not on file  Social History Narrative   One level home; lives with daughter and  Husband; some college; coffee daily 1 cup occ soft drinks; exercise none   Left handed   Social Determinants of Health   Financial Resource Strain: Not on file  Food Insecurity: Not on file  Transportation Needs: Not on file  Physical Activity: Not on file  Stress: Not on file  Social Connections: Not on file   Family History  Problem Relation Age of Onset   Diabetes Mother    Hypertension Mother  Thyroid disease Mother    Allergies  Allergen Reactions   No Known Allergies    Gadolinium Derivatives Other (See Comments)    Temporary burning sensation radiating up arm   Current Outpatient Medications  Medication Sig Dispense Refill   amoxicillin-clavulanate (AUGMENTIN) 875-125 MG tablet Take 1 tablet by mouth every 12 (twelve) hours. 14 tablet 0   aspirin-acetaminophen-caffeine (EXCEDRIN MIGRAINE) 250-250-65 MG tablet Take 2 tablets by mouth every 6 (six) hours as needed for headache.     atenolol-chlorthalidone (TENORETIC) 50-25 MG tablet Take by mouth.     Fremanezumab-vfrm (AJOVY) 225 MG/1.5ML SOAJ Inject 225 mg into the skin every 28 (twenty-eight) days. (Patient not taking: Reported on 09/15/2021) 5.04 mL 1   Galcanezumab-gnlm (EMGALITY) 120 MG/ML SOAJ Inject 120 mg into the skin every 28 (twenty-eight) days. (Patient not taking:  Reported on 09/15/2021) 1.12 mL 5   ibuprofen (ADVIL,MOTRIN) 200 MG tablet Take 800 mg by mouth every 8 (eight) hours as needed (for headache).     levothyroxine (SYNTHROID) 112 MCG tablet Take 112 mcg by mouth daily before breakfast.     naproxen (NAPROSYN) 500 MG tablet Take 1 tablet (500 mg total) by mouth 2 (two) times daily. 30 tablet 0   promethazine (PHENERGAN) 12.5 MG tablet Take 1 tablet (12.5 mg total) by mouth every 8 (eight) hours as needed for nausea or vomiting. 90 tablet 3   Rimegepant Sulfate (NURTEC) 75 MG TBDP Take 1 tablet by mouth once as needed for up to 1 dose (on onset of headaches). 24 tablet 3   Ubrogepant (UBRELVY) 100 MG TABS Take 100 mg by mouth as needed (As needed for Mirgrine.may repeat in 2 hours, maximum 2 tablets in 24 hours). 16 tablet 5   No current facility-administered medications for this visit.   No results found.  Review of Systems:   A ROS was performed including pertinent positives and negatives as documented in the HPI.  Physical Exam :   Constitutional: NAD and appears stated age Neurological: Alert and oriented Psych: Appropriate affect and cooperative There were no vitals taken for this visit.   Comprehensive Musculoskeletal Exam:    Musculoskeletal Exam    Inspection Right Left  Skin No atrophy or winging No atrophy or winging  Palpation    Tenderness None Glenohumeral  Range of Motion    Flexion (passive) 170 140  Flexion (active) 170 130  Abduction 170 130  ER at the side 70 40  Can reach behind back to T12 From pocket  Strength     Full Painful  Special Tests    Pseudoparalytic No No  Neurologic    Fires PIN, radial, median, ulnar, musculocutaneous, axillary, suprascapular, long thoracic, and spinal accessory innervated muscles. No abnormal sensibility  Vascular/Lymphatic    Radial Pulse 2+ 2+  Cervical Exam    Patient has symmetric cervical range of motion with negative Spurling's test.  Special Test: Pain with passive  external rotation     Imaging:   Xray (3 views left shoulder): Normal    I personally reviewed and interpreted the radiographs.   Assessment:   55 year old female with left shoulder pain consistent with adhesive capsulitis.  She is experiencing difficult improvement although I would like to perform an additional steroid injection today as I believe she is about 80% improved but would like to be able to achieve at least another 10 to 15% improvement.  We will plan to do this today.  She will see me back on an as-needed  basis.  She will do gentle active range of motion about the shoulder following this.  Plan :    -Plan for left shoulder ultrasound-guided injection for adhesive capsulitis    Procedure Note  Patient: [name]             Date of Birth: [birth year]           MRN: [id number]             Visit Date: 10/01/2021  Procedures: Visit Diagnoses: No diagnosis found.  Large Joint Inj on 10/01/2021 9:59 AM Indications: pain Details: 22 G 1.5 in needle, ultrasound-guided anterior approach  Arthrogram: No  Medications: 4 mL lidocaine 1 %; 80 mg triamcinolone acetonide 40 MG/ML Outcome: tolerated well, no immediate complications Procedure, treatment alternatives, risks and benefits explained, specific risks discussed. Consent was given by the patient. Immediately prior to procedure a time out was called to verify the correct patient, procedure, equipment, support staff and site/side marked as required. Patient was prepped and draped in the usual sterile fashion.         I personally saw and evaluated the patient, and participated in the management and treatment plan.  Vanetta Mulders, MD Attending Physician, Orthopedic Surgery  This document was dictated using Dragon voice recognition software. A reasonable attempt at proof reading has been made to minimize errors.

## 2021-10-02 NOTE — Progress Notes (Signed)
NEUROLOGY FOLLOW UP OFFICE NOTE  [name] [id number]  Assessment/Plan:   Migraine without aura, without status migrainosus, not intractable  Migraine prevention:  She should be able to start another CGRP inhibitor.  Will reach out to reps for both Emgality and Ajovy to find out if there are any programs that she would qualify to get one of these medications. Migraine rescue:  Will have her try Trudhesa NS Limit use of pain relievers to no more than 2 days out of week to prevent risk of rebound or medication-overuse headache. Keep headache diary Follow up 7 months   Subjective:  [name] is a 55 year old left-handed female with hypertension and acquired hypothyroidism who follows up for migraine.   UPDATE: Due to worsening headaches, plan was to switch from Aimovig to [facility] which was denied.  Tried to start Ajovy but this was also denied.  Therefore, there has been no change in headache frequency.  Mild headaches 3 days a week, severe 2 days a week.  Nurtec was ineffective.  [name] is effective, significantly reducing intensity.  However Ubrelvy taken with Elyxyb aborted the headache completely.  Unfortunately, Elyxyb has now been taken off of the market.  Rescue therapy:  all OTC analgesics.  At night, will take Benadryl as well.   Current NSAIDS: none Current analgesics: Excedrin Current triptans: none Current ergotamine: None Current anti-emetic: Promethazine 12.5 mg Current muscle relaxants: None Current anti-anxiolytic: None Current sleep aide: None Current Antihypertensive medications: Atenolol- chlorthalidone Current Antidepressant medications: None Current Anticonvulsant medications: none Current anti-CGRP: Ajovy Current Vitamins/Herbal/Supplements: None Current Antihistamines/Decongestants: None Other therapy: Caffeine for abortive therapy, daith piercing Hormone/birth control: None   She reports chronic low back pain with pain radiating down the  back of her left leg down to the ankle.  No preceding trauma.  No weakness.  She started exercising and working out 3 days a week and she has changed her diet.  She is doing better.   Caffeine: Coffee usually only for headache treatment Alcohol: None Smoker: None Diet: Sees nutritionist.  Keeps hydrated Exercise:Started exercising and working out 3 days a week with trainer. Depression: No; Anxiety: Mild.  Much better working from home.   Other pain: No Sleep hygiene: Okay   HISTORY:  Onset: She has had migraines since age 57, however they have steadily gotten worse over the past year and have been daily since last week. Location:  Varies(either side, holocephalic).  Over the past week, she has had left sided neck pain Quality:  pounding Initial Intensity:  10/10; December: 4/10 mild, 10/10 severe Aura:  no Prodrome:  no Associated symptoms: Nausea, photophobia, sometimes vomiting Iniitial Duration:  Several hours to 2 days Initial Frequency:  Usually 2-3 times a month Triggers: Menstrual cycle Relieving factors:  Sleep, medication Activity:  Cannot function   Past NSAIDS:  Naproxen; diclofenac, Cambia, Elyxyb (effective but no longer available on market) Past analgesics:  Tylenol; hydrocodone Past abortive triptans:  Zomig 5mg  tablet (lost efficacy); Zomig 5mg  NS (effective but nasal spray irritant); sumatriptan NS; sumatriptan 100mg ; eletriptan; Zembrace SymTouch (effective but makes her drowsy); rizatriptan 10mg  Past abortive ergotamine:  none Past muscle relaxants:  none Past anti-emetic:  none Past antihypertensive medications:  metoprolol Past antidepressant medications:  Nortriptyline  Past anticonvulsant medications:  Trokendi XR 100mg  Past anti-CGRP:  Aimovig 140mg , Nurtec, Ubrelvy Past vitamins/Herbal/Supplements:  Magnesium citrate; Co-Q 10; ribolfavin Past antihistamines/decongestants:  none Other past therapies:  none     Family history of headache:  No  PAST  MEDICAL HISTORY: Past Medical History:  Diagnosis Date   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 Tomi Likens seeing pt. for HA management    High blood pressure    HYPERTENSION 06/14/2007   Hypothyroidism 2018   thyroidectomy    Migraines    Nodular goiter    Obesity    PONV (postoperative nausea and vomiting)    Vitamin D deficiency     MEDICATIONS: Current Outpatient Medications on File Prior to Visit  Medication Sig Dispense Refill   amoxicillin-clavulanate (AUGMENTIN) 875-125 MG tablet Take 1 tablet by mouth every 12 (twelve) hours. 14 tablet 0   aspirin-acetaminophen-caffeine (EXCEDRIN MIGRAINE) 250-250-65 MG tablet Take 2 tablets by mouth every 6 (six) hours as needed for headache.     atenolol-chlorthalidone (TENORETIC) 50-25 MG tablet Take by mouth.     Fremanezumab-vfrm (AJOVY) 225 MG/1.5ML SOAJ Inject 225 mg into the skin every 28 (twenty-eight) days. (Patient not taking: Reported on 09/15/2021) 5.04 mL 1   Galcanezumab-gnlm (EMGALITY) 120 MG/ML SOAJ Inject 120 mg into the skin every 28 (twenty-eight) days. (Patient not taking: Reported on 09/15/2021) 1.12 mL 5   ibuprofen (ADVIL,MOTRIN) 200 MG tablet Take 800 mg by mouth every 8 (eight) hours as needed (for headache).     levothyroxine (SYNTHROID) 112 MCG tablet Take 112 mcg by mouth daily before breakfast.     naproxen (NAPROSYN) 500 MG tablet Take 1 tablet (500 mg total) by mouth 2 (two) times daily. 30 tablet 0   promethazine (PHENERGAN) 12.5 MG tablet Take 1 tablet (12.5 mg total) by mouth every 8 (eight) hours as needed for nausea or vomiting. 90 tablet 3   Rimegepant Sulfate (NURTEC) 75 MG TBDP Take 1 tablet by mouth once as needed for up to 1 dose (on onset of headaches). 24 tablet 3   Ubrogepant (UBRELVY) 100 MG TABS Take 100 mg by mouth as needed (As needed for Mirgrine.may repeat in 2 hours, maximum 2 tablets in 24 hours). 16 tablet 5   No current facility-administered medications on  file prior to visit.    ALLERGIES: Allergies  Allergen Reactions   No Known Allergies    Gadolinium Derivatives Other (See Comments)    Temporary burning sensation radiating up arm    FAMILY HISTORY: Family History  Problem Relation Age of Onset   Diabetes Mother    Hypertension Mother    Thyroid disease Mother       Objective:  Blood pressure 115/76, pulse 70, height 5\' 5"  (1.651 m), weight 241 lb (109.3 kg), SpO2 98 %. General: No acute distress.  Patient appears well-groomed.     Metta Clines, DO

## 2021-10-03 ENCOUNTER — Ambulatory Visit: Payer: BC Managed Care – PPO | Admitting: Neurology

## 2021-10-03 ENCOUNTER — Other Ambulatory Visit: Payer: Self-pay

## 2021-10-03 ENCOUNTER — Encounter: Payer: Self-pay | Admitting: Neurology

## 2021-10-03 ENCOUNTER — Ambulatory Visit: Payer: Self-pay | Admitting: Neurology

## 2021-10-03 VITALS — BP 115/76 | HR 70 | Ht 65.0 in | Wt 241.0 lb

## 2021-10-03 DIAGNOSIS — G43009 Migraine without aura, not intractable, without status migrainosus: Secondary | ICD-10-CM

## 2021-10-03 NOTE — Progress Notes (Signed)
Medication Samples have been provided to the patient.  Drug name: Halford Decamp: 2  LOT: [id number]  Exp.Date: 03/19/22   The patient has been instructed regarding the correct time, dose, and frequency of taking this medication, including desired effects and most common side effects.

## 2021-10-03 NOTE — Patient Instructions (Signed)
Take Trudhesa spray - prime 4 times.  1 spray in each nostril.  May repeat once after 1 hour.   Will start Emgality or Ajovy

## 2021-10-04 ENCOUNTER — Other Ambulatory Visit: Payer: Self-pay | Admitting: Neurology

## 2021-10-04 MED ORDER — TRUDHESA 0.725 MG/ACT NA AERS: 2.0000 | INHALATION_SPRAY | NASAL | 5 refills | Status: DC | PRN

## 2021-10-09 ENCOUNTER — Telehealth: Payer: Self-pay

## 2021-10-09 NOTE — Telephone Encounter (Signed)
New message   Your information has been sent to [facility].  [name] Key: [id number] - PA Case ID: [id number] - Rx #: [id number] Need help? Call us at [phone] Status Sent to [place] 0.725MG /ACT aerosol Form [facility] Electronic Prior Authorization Form [id number] NCPDP) Original Claim Info 18

## 2021-10-10 NOTE — Telephone Encounter (Signed)
F/u  Tovah Weldon Key: [id number] - PA Case ID: [id number] - Rx #: [id number] Need help? Call us at [phone] Outcome Approvedon December 21 Request Reference Number: [id number]. TRUDHESA AER 0.725MG  is approved through 10/09/2022. Your patient may now fill this prescription and it will be covered. Drug Trudhesa 0.725MG /ACT aerosol Form [facility] Electronic Prior Authorization Form [phone] NCPDP) Original Claim Info

## 2021-10-18 DIAGNOSIS — R52 Pain, unspecified: Secondary | ICD-10-CM | POA: Diagnosis not present

## 2021-10-18 DIAGNOSIS — R110 Nausea: Secondary | ICD-10-CM | POA: Diagnosis not present

## 2021-10-18 DIAGNOSIS — U071 COVID-19: Secondary | ICD-10-CM | POA: Diagnosis not present

## 2021-10-18 DIAGNOSIS — R5383 Other fatigue: Secondary | ICD-10-CM | POA: Diagnosis not present

## 2021-10-28 DIAGNOSIS — R42 Dizziness and giddiness: Secondary | ICD-10-CM | POA: Diagnosis not present

## 2021-10-28 DIAGNOSIS — G4700 Insomnia, unspecified: Secondary | ICD-10-CM | POA: Diagnosis not present

## 2021-10-28 DIAGNOSIS — R110 Nausea: Secondary | ICD-10-CM | POA: Diagnosis not present

## 2021-10-28 DIAGNOSIS — R634 Abnormal weight loss: Secondary | ICD-10-CM | POA: Diagnosis not present

## 2021-11-08 DIAGNOSIS — Z1211 Encounter for screening for malignant neoplasm of colon: Secondary | ICD-10-CM | POA: Diagnosis not present

## 2021-11-08 DIAGNOSIS — R1032 Left lower quadrant pain: Secondary | ICD-10-CM | POA: Diagnosis not present

## 2021-11-08 DIAGNOSIS — K5730 Diverticulosis of large intestine without perforation or abscess without bleeding: Secondary | ICD-10-CM | POA: Diagnosis not present

## 2021-11-08 DIAGNOSIS — R1031 Right lower quadrant pain: Secondary | ICD-10-CM | POA: Diagnosis not present

## 2021-11-13 DIAGNOSIS — Z6841 Body Mass Index (BMI) 40.0 and over, adult: Secondary | ICD-10-CM | POA: Diagnosis not present

## 2021-11-13 DIAGNOSIS — I10 Essential (primary) hypertension: Secondary | ICD-10-CM | POA: Diagnosis not present

## 2021-11-13 DIAGNOSIS — E890 Postprocedural hypothyroidism: Secondary | ICD-10-CM | POA: Diagnosis not present

## 2021-12-06 ENCOUNTER — Telehealth: Payer: Self-pay

## 2021-12-06 NOTE — Telephone Encounter (Signed)
New message ° ° ° ° °

## 2021-12-25 ENCOUNTER — Telehealth: Payer: BC Managed Care – PPO | Admitting: Physician Assistant

## 2021-12-25 DIAGNOSIS — N390 Urinary tract infection, site not specified: Secondary | ICD-10-CM

## 2021-12-25 MED ORDER — CEPHALEXIN 500 MG PO CAPS: ORAL_CAPSULE | ORAL | 0 refills | Status: DC

## 2021-12-25 NOTE — Progress Notes (Signed)
E-Visit for Urinary Problems ? ?We are sorry that you are not feeling well.  Here is how we plan to help! ? ?Based on what you shared with me it looks like you most likely have a simple urinary tract infection. ? ?A UTI (Urinary Tract Infection) is a bacterial infection of the bladder. ? ?Most cases of urinary tract infections are simple to treat but a key part of your care is to encourage you to drink plenty of fluids and watch your symptoms carefully. ? ?I have prescribed Keflex 500 mg twice a day for 7 days.  Your symptoms should gradually improve. Call us if the burning in your urine worsens, you develop worsening fever, back pain or pelvic pain or if your symptoms do not resolve after completing the antibiotic. ? ?Urinary tract infections can be prevented by drinking plenty of water to keep your body hydrated.  Also be sure when you wipe, wipe from front to back and don't hold it in!  If possible, empty your bladder every 4 hours. ? ?HOME CARE ?Drink plenty of fluids ?Compete the full course of the antibiotics even if the symptoms resolve ?Remember, when you need to go?go. Holding in your urine can increase the likelihood of getting a UTI! ?GET HELP RIGHT AWAY IF: ?You cannot urinate ?You get a high fever ?Worsening back pain occurs ?You see blood in your urine ?You feel sick to your stomach or throw up ?You feel like you are going to pass out ? ?MAKE SURE YOU  ?Understand these instructions. ?Will watch your condition. ?Will get help right away if you are not doing well or get worse. ? ? ?Thank you for choosing an e-visit. ? ?Your e-visit answers were reviewed by a board certified advanced clinical practitioner to complete your personal care plan. Depending upon the condition, your plan could have included both over the counter or prescription medications. ? ?Please review your pharmacy choice. Make sure the pharmacy is open so you can pick up prescription now. If there is a problem, you may contact your  provider through [employer] and have the prescription routed to another pharmacy.  Your safety is important to [name]. If you have drug allergies check your prescription carefully.  ? ?For the next 24 hours you can use MyChart to ask questions about today's visit, request a non-urgent call back, or ask for a work or school excuse. ?You will get an email in the next two days asking about your experience. I hope that your e-visit has been valuable and will speed your recovery. ? ?Greater than 5 minutes, yet less than 10 minutes of time have been spent researching, coordinating, and implementing care for this patient today ? ?

## 2022-01-13 ENCOUNTER — Other Ambulatory Visit (HOSPITAL_COMMUNITY): Payer: Self-pay

## 2022-01-13 NOTE — Telephone Encounter (Signed)
Faxed PA form & chart notes to [facility] '@[phone]'$ -[id number] for the Celecoxib '120mg'$ /4.29m solution. ?

## 2022-01-20 ENCOUNTER — Encounter: Payer: Self-pay | Admitting: Neurology

## 2022-01-21 ENCOUNTER — Ambulatory Visit (INDEPENDENT_AMBULATORY_CARE_PROVIDER_SITE_OTHER): Payer: BC Managed Care – PPO

## 2022-01-21 ENCOUNTER — Other Ambulatory Visit: Payer: Self-pay

## 2022-01-21 ENCOUNTER — Ambulatory Visit: Payer: BC Managed Care – PPO

## 2022-01-21 DIAGNOSIS — G43009 Migraine without aura, not intractable, without status migrainosus: Secondary | ICD-10-CM

## 2022-01-21 MED ORDER — METOCLOPRAMIDE HCL 5 MG/ML IJ SOLN
10.0000 mg | Freq: Once | INTRAMUSCULAR | Status: AC
  Administered 2022-01-21: 10 mg via INTRAMUSCULAR

## 2022-01-21 MED ORDER — UBRELVY 100 MG PO TABS: 100.0000 mg | ORAL_TABLET | ORAL | 5 refills | Status: DC | PRN

## 2022-01-21 MED ORDER — DIPHENHYDRAMINE HCL 50 MG/ML IJ SOLN
25.0000 mg | Freq: Once | INTRAMUSCULAR | Status: AC
  Administered 2022-01-21: 25 mg via INTRAMUSCULAR

## 2022-01-21 MED ORDER — KETOROLAC TROMETHAMINE 60 MG/2ML IM SOLN
60.0000 mg | Freq: Once | INTRAMUSCULAR | Status: AC
  Administered 2022-01-21: 60 mg via INTRAMUSCULAR

## 2022-01-21 NOTE — Progress Notes (Signed)
Medication Samples have been provided to the patient. ? ?Drug name: Hinda Kehr       Strength: 100 mg        Qty: 4  LOT: [id number]  Exp.Date: 03/2023,04/2022 ? ?Dosing instructions: as needed ? ?The patient has been instructed regarding the correct time, dose, and frequency of taking this medication, including desired effects and most common side effects.  ? ?Venetia Night ?2:11 PM ?01/21/2022  ?

## 2022-01-22 ENCOUNTER — Telehealth: Payer: Self-pay

## 2022-01-22 ENCOUNTER — Other Ambulatory Visit (HOSPITAL_COMMUNITY): Payer: Self-pay

## 2022-01-22 NOTE — Telephone Encounter (Signed)
Patient Advocate Encounter ?  ?Received notification from [facility] that prior authorization for Ubrelvy '100mg'$  tabs is required by his/her insurance [facility]. ?  ?PA submitted on 01/22/22 ? ?Key#: [id number] ? ?Status is pending ?   ?[facility] will continue to follow: ? ?Patient Advocate ?Fax: [phone]  ?

## 2022-01-23 NOTE — Telephone Encounter (Signed)
Received notification from [facility] regarding a prior authorization for  Ubrelvy '100mg'$  . Authorization has been APPROVED from 01/22/22 to 01/23/23.  ? ?Plan will cover 10 tablets per month ? ?Authorization # [id number] ?Phone # [phone] ? ?

## 2022-03-18 ENCOUNTER — Other Ambulatory Visit: Payer: Self-pay | Admitting: Neurology

## 2022-05-06 NOTE — Progress Notes (Unsigned)
NEUROLOGY FOLLOW UP OFFICE NOTE  [name] [id number]  Assessment/Plan:     Migraine without aura, without status migrainosus, not intractable   Migraine prevention:  She will contact her insurance to find out which other CGRP inhibitor is covered (Emgality or Ajovy) as she has failed Aimovig.  She will contact us with their answer Migraine rescue:  Ubrelvy , promethazine for nausea Limit use of pain relievers to no more than 2 days out of week to prevent risk of rebound or medication-overuse headache. Keep headache diary Follow up 4 months     Subjective:  [name] is a 56 year old left-handed female with hypertension and acquired hypothyroidism who follows up for migraine.   UPDATE: Never started Ajovy.  We got it approved but her pharmacy never contacted her. Tried Trudhesa which was helpful but burns.  Now getting Roselyn Meier again through [facility], which helps. Intensity:  moderate to severe Duration:  aborts quickly if takes [place]  Frequency:  every other day, severe ones occur 2 to 3 a week   Rescue therapy:  Ubrelvy 2-3 days a week, takes Excedrin Migraine the other days a week Current NSAIDS: Elyxyb Current analgesics: Excedrin Current triptans: none Current ergotamine: none Current anti-emetic: Promethazine 12.5 mg Current muscle relaxants: None Current anti-anxiolytic: None Current sleep aide: None Current Antihypertensive medications: Atenolol- chlorthalidone Current Antidepressant medications: None Current Anticonvulsant medications: none Current anti-CGRP: None Current Vitamins/Herbal/Supplements: None Current Antihistamines/Decongestants: None Other therapy: Caffeine for abortive therapy, daith piercing Hormone/birth control: None   She reports chronic low back pain with pain radiating down the back of her left leg down to the ankle.  No preceding trauma.  No weakness.  She started exercising and working out 3 days a week and she has changed  her diet.  She is doing better.   Caffeine: Coffee usually only for headache treatment Alcohol: None Smoker: None Diet: Sees nutritionist.  Keeps hydrated Exercise:Started exercising and working out 3 days a week with trainer. Depression: No; Anxiety: Mild.  Much better working from home.   Other pain: No Sleep hygiene: Okay   HISTORY:  Onset: She has had migraines since age 77, however they have steadily gotten worse over the past year and have been daily since last week. Location:  Varies(either side, holocephalic).  Over the past week, she has had left sided neck pain Quality:  pounding Initial Intensity:  10/10; December: 4/10 mild, 10/10 severe Aura:  no Prodrome:  no Associated symptoms: Nausea, photophobia, sometimes vomiting Iniitial Duration:  Several hours to 2 days Initial Frequency:  Usually 2-3 times a month Triggers: Menstrual cycle Relieving factors:  Sleep, medication Activity:  Cannot function   Past NSAIDS:  Naproxen; diclofenac, Cambia, Elyxyb (effective but no longer available on market) Past analgesics:  Tylenol; hydrocodone Past abortive triptans:  Zomig '5mg'$  tablet (lost efficacy); Zomig '5mg'$  NS (effective but nasal spray irritant); sumatriptan NS; sumatriptan '100mg'$ ; eletriptan; Zembrace SymTouch (effective but makes her drowsy); rizatriptan '10mg'$  Past abortive ergotamine:  Trudhesa NS Past muscle relaxants:  none Past anti-emetic:  none Past antihypertensive medications:  metoprolol Past antidepressant medications:  Nortriptyline  Past anticonvulsant medications:  Trokendi XR '100mg'$  Past anti-CGRP:  Aimovig '140mg'$ , Nurtec Past vitamins/Herbal/Supplements:  Magnesium citrate; Co-Q 10; ribolfavin Past antihistamines/decongestants:  none Other past therapies:  none     Family history of headache:  No  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 Tomi Likens seeing pt.  for HA management    High blood pressure    HYPERTENSION 06/14/2007   Hypothyroidism 2018   thyroidectomy    Migraines    Nodular goiter    Obesity    PONV (postoperative nausea and vomiting)    Vitamin D deficiency     MEDICATIONS: Current Outpatient Medications on File Prior to Visit  Medication Sig Dispense Refill   aspirin-acetaminophen-caffeine (EXCEDRIN MIGRAINE) 250-250-65 MG tablet Take 2 tablets by mouth every 6 (six) hours as needed for headache.     atenolol-chlorthalidone (TENORETIC) 50-25 MG tablet Take by mouth.     Celecoxib 120 MG/4.8ML SOLN Take by mouth. (Patient not taking: Reported on 10/03/2021)     cephALEXin (KEFLEX) 500 MG capsule 1 cap po bid x 7 days 14 capsule 0   Fremanezumab-vfrm (AJOVY) 225 MG/1.5ML SOAJ Inject 225 mg into the skin every 28 (twenty-eight) days. (Patient not taking: Reported on 09/15/2021) 5.04 mL 1   ibuprofen (ADVIL,MOTRIN) 200 MG tablet Take 800 mg by mouth every 8 (eight) hours as needed (for headache).     levothyroxine (SYNTHROID) 112 MCG tablet Take 112 mcg by mouth daily before breakfast.     promethazine (PHENERGAN) 12.5 MG tablet Take 1 tablet (12.5 mg total) by mouth every 8 (eight) hours as needed for nausea or vomiting. 90 tablet 3   TRUDHESA 0.725 MG/ACT AERS USE 1 SPRAY IN EACH NOSTRIL AS NEEDED. MAY REPEAT IN 1 HOUR. MAXIMUM 2 DOSES PER DAY. MAXIMUM 3 DOSES WITHIN 7 DAYS. 8 mL 1   Ubrogepant (UBRELVY) 100 MG TABS Take 100 mg by mouth as needed (As needed for Mirgrine.may repeat in 2 hours, maximum 2 tablets in 24 hours). 16 tablet 5   No current facility-administered medications on file prior to visit.    ALLERGIES: Allergies  Allergen Reactions   No Known Allergies    Gadolinium Derivatives Other (See Comments)    Temporary burning sensation radiating up arm    FAMILY HISTORY: Family History  Problem Relation Age of Onset   Diabetes Mother    Hypertension Mother    Thyroid disease Mother       Objective:   Blood pressure 120/80, pulse 87, height '5\' 5"'$  (1.651 m), weight 242 lb (109.8 kg), SpO2 98 %. General: No acute distress.  Patient appears well-groomed.      Metta Clines, DO

## 2022-05-07 ENCOUNTER — Ambulatory Visit: Payer: BC Managed Care – PPO | Admitting: Neurology

## 2022-05-07 ENCOUNTER — Encounter: Payer: Self-pay | Admitting: Neurology

## 2022-05-07 VITALS — BP 120/80 | HR 87 | Ht 65.0 in | Wt 242.0 lb

## 2022-05-07 DIAGNOSIS — G43009 Migraine without aura, not intractable, without status migrainosus: Secondary | ICD-10-CM

## 2022-06-20 ENCOUNTER — Ambulatory Visit (INDEPENDENT_AMBULATORY_CARE_PROVIDER_SITE_OTHER): Payer: BC Managed Care – PPO | Admitting: Orthopaedic Surgery

## 2022-06-20 ENCOUNTER — Ambulatory Visit (HOSPITAL_BASED_OUTPATIENT_CLINIC_OR_DEPARTMENT_OTHER): Payer: BC Managed Care – PPO | Admitting: Orthopaedic Surgery

## 2022-06-20 DIAGNOSIS — M25512 Pain in left shoulder: Secondary | ICD-10-CM

## 2022-06-20 DIAGNOSIS — M7502 Adhesive capsulitis of left shoulder: Secondary | ICD-10-CM | POA: Diagnosis not present

## 2022-06-20 MED ORDER — TRIAMCINOLONE ACETONIDE 40 MG/ML IJ SUSP
80.0000 mg | INTRAMUSCULAR | Status: AC | PRN
  Administered 2022-06-20: 80 mg via INTRA_ARTICULAR

## 2022-06-20 MED ORDER — LIDOCAINE HCL 1 % IJ SOLN
4.0000 mL | INTRAMUSCULAR | Status: AC | PRN
  Administered 2022-06-20: 4 mL

## 2022-06-20 NOTE — Progress Notes (Signed)
Chief Complaint: left shoulder pain     History of Present Illness:   06/20/2022: Presents today for follow-up of the left shoulder.  She states that she got extremely good relief after her first injection for frozen shoulder unfortunately she believes that she had a recurrence of this.  She is here today wishing to have an additional shoulder injection.  [name] is a 56 y.o. female LHD presents with left shoulder pain atraumatic in nature since 27th November.  She states that she woke up with a significant sharp pain in the shoulder she has a history of hypothyroidism.  She says that she is unable to directly lie on the side.  This is her dominant arm and is significantly affecting all of her activities of daily living.  She has been taking naproxen which does not help.  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 Tomi Likens seeing pt. for HA management    High blood pressure    HYPERTENSION 06/14/2007   Hypothyroidism 2018   thyroidectomy    Migraines    Nodular goiter    Obesity    PONV (postoperative nausea and vomiting)    Vitamin D deficiency    Past Surgical History:  Procedure Laterality Date   ABDOMINAL HYSTERECTOMY     [place]   x2, with epidural & general anesth.    DILATION AND CURETTAGE OF UTERUS     x2   HYSTERECTOMY ABDOMINAL WITH SALPINGO-OOPHORECTOMY N/A 01/26/2018   Procedure: SUPRACERVICAL HYSTERECTOMY ABDOMINAL WITH SALPINGO-OOPHORECTOMY;  Surgeon: [name], MD;  Location: [facility];  Service: Gynecology;  Laterality: N/A;   LYSIS OF ADHESION N/A 01/26/2018   Procedure: EXTENSIVE LYSIS OF ADHESIONS;  Surgeon: [name], MD;  Location: [facility];  Service: Gynecology;  Laterality: N/A;    THYROIDECTOMY  11/18/2016   THYROIDECTOMY N/A 11/18/2016   Procedure: TOTAL THYROIDECTOMY;  Surgeon: Armandina Gemma, MD;  Location: [facility];  Service: General;  Laterality: N/A;   THYROIDECTOMY     Social History   Socioeconomic History   Marital status: Married    Spouse name: Not on file   Number of children: 3   Years of education: Not on file   Highest education level: Not on file  Occupational History   Occupation: Airline pilot: [employer]  Tobacco Use   Smoking status: Never   Smokeless tobacco: Never  Vaping Use   Vaping Use: Never used  Substance and Sexual Activity   Alcohol use: No   Drug use: No   Sexual activity: Yes    Birth control/protection: None  Other Topics Concern   Not on file  Social History Narrative   One level home; lives with daughter and  Husband; some college; coffee daily 1 cup occ soft drinks; exercise none   Left handed   Social Determinants of Health   Financial Resource Strain: Not on file  Food Insecurity: Not on file  Transportation Needs: Not on file  Physical Activity: Not on file  Stress: Not on file  Social Connections: Not  on file   Family History  Problem Relation Age of Onset   Diabetes Mother    Hypertension Mother    Thyroid disease Mother    Allergies  Allergen Reactions   No Known Allergies    Gadolinium Derivatives Other (See Comments)    Temporary burning sensation radiating up arm   Current Outpatient Medications  Medication Sig Dispense Refill   aspirin-acetaminophen-caffeine (EXCEDRIN MIGRAINE) 250-250-65 MG tablet Take 2 tablets by mouth every 6 (six) hours as needed for headache.     atenolol-chlorthalidone (TENORETIC) 50-25 MG tablet Take by mouth.     Fremanezumab-vfrm (AJOVY) 225 MG/1.5ML SOAJ Inject 225 mg into the skin every 28 (twenty-eight) days. 5.04 mL 1   ibuprofen (ADVIL,MOTRIN) 200 MG tablet Take 800 mg by mouth every 8 (eight) hours as needed (for headache).     levothyroxine  (SYNTHROID) 112 MCG tablet Take 112 mcg by mouth daily before breakfast.     promethazine (PHENERGAN) 12.5 MG tablet Take 1 tablet (12.5 mg total) by mouth every 8 (eight) hours as needed for nausea or vomiting. 90 tablet 3   Ubrogepant (UBRELVY) 100 MG TABS Take 100 mg by mouth as needed (As needed for Mirgrine.may repeat in 2 hours, maximum 2 tablets in 24 hours). 16 tablet 5   No current facility-administered medications for this visit.   No results found.  Review of Systems:   A ROS was performed including pertinent positives and negatives as documented in the HPI.  Physical Exam :   Constitutional: NAD and appears stated age Neurological: Alert and oriented Psych: Appropriate affect and cooperative There were no vitals taken for this visit.   Comprehensive Musculoskeletal Exam:    Musculoskeletal Exam    Inspection Right Left  Skin No atrophy or winging No atrophy or winging  Palpation    Tenderness None Glenohumeral  Range of Motion    Flexion (passive) 170 140  Flexion (active) 170 130  Abduction 170 130  ER at the side 70 40  Can reach behind back to T12 From pocket  Strength     Full Painful  Special Tests    Pseudoparalytic No No  Neurologic    Fires PIN, radial, median, ulnar, musculocutaneous, axillary, suprascapular, long thoracic, and spinal accessory innervated muscles. No abnormal sensibility  Vascular/Lymphatic    Radial Pulse 2+ 2+  Cervical Exam    Patient has symmetric cervical range of motion with negative Spurling's test.  Special Test: Pain with passive external rotation     Imaging:   Xray (3 views left shoulder): Normal    I personally reviewed and interpreted the radiographs.   Assessment:   56 year old female with left shoulder pain consistent with adhesive capsulitis.  At today's visit I recommend an additional ultrasound-guided injection.  We will plan to proceed with this.  I will see her back in 2 weeks for reassessment  Plan :     -Plan for left shoulder ultrasound-guided injection for adhesive capsulitis    Procedure Note  Patient: [name]             Date of Birth: 17-Feb-1966           MRN: [id number]             Visit Date: 06/20/2022  Procedures: Visit Diagnoses: No diagnosis found.  Large Joint Inj: L glenohumeral on 06/20/2022 3:03 PM Indications: pain Details: 22 G 1.5 in needle, ultrasound-guided anterior approach  Arthrogram: No  Medications: 4 mL lidocaine 1 %;  80 mg triamcinolone acetonide 40 MG/ML Outcome: tolerated well, no immediate complications Procedure, treatment alternatives, risks and benefits explained, specific risks discussed. Consent was given by the patient. Immediately prior to procedure a time out was called to verify the correct patient, procedure, equipment, support staff and site/side marked as required. Patient was prepped and draped in the usual sterile fashion.         I personally saw and evaluated the patient, and participated in the management and treatment plan.  Vanetta Mulders, MD Attending Physician, Orthopedic Surgery  This document was dictated using Dragon voice recognition software. A reasonable attempt at proof reading has been made to minimize errors.

## 2022-07-04 ENCOUNTER — Ambulatory Visit (HOSPITAL_BASED_OUTPATIENT_CLINIC_OR_DEPARTMENT_OTHER): Payer: BC Managed Care – PPO | Admitting: Orthopaedic Surgery

## 2022-07-11 ENCOUNTER — Ambulatory Visit (INDEPENDENT_AMBULATORY_CARE_PROVIDER_SITE_OTHER): Payer: BC Managed Care – PPO | Admitting: Orthopaedic Surgery

## 2022-07-11 DIAGNOSIS — M7502 Adhesive capsulitis of left shoulder: Secondary | ICD-10-CM | POA: Diagnosis not present

## 2022-07-11 MED ORDER — LIDOCAINE HCL 1 % IJ SOLN
4.0000 mL | INTRAMUSCULAR | Status: AC | PRN
  Administered 2022-07-11: 4 mL

## 2022-07-11 MED ORDER — TRIAMCINOLONE ACETONIDE 40 MG/ML IJ SUSP
80.0000 mg | INTRAMUSCULAR | Status: AC | PRN
  Administered 2022-07-11: 80 mg via INTRA_ARTICULAR

## 2022-07-11 NOTE — Progress Notes (Signed)
Chief Complaint: left shoulder pain     History of Present Illness:   07/11/2022: Presents today for 2-week follow-up of the left shoulder injection.  Overall she did get improvement from previous injection although that she does have still some limited range of motion.  She is here today for 2-week follow-up.  [name] is a 56 y.o. female LHD presents with left shoulder pain atraumatic in nature since 27th November.  She states that she woke up with a significant sharp pain in the shoulder she has a history of hypothyroidism.  She says that she is unable to directly lie on the side.  This is her dominant arm and is significantly affecting all of her activities of daily living.  She has been taking naproxen which does not help.  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 Tomi Likens seeing pt. for HA management    High blood pressure    HYPERTENSION 06/14/2007   Hypothyroidism 2018   thyroidectomy    Migraines    Nodular goiter    Obesity    PONV (postoperative nausea and vomiting)    Vitamin D deficiency    Past Surgical History:  Procedure Laterality Date   ABDOMINAL HYSTERECTOMY     [place]   x2, with epidural & general anesth.    DILATION AND CURETTAGE OF UTERUS     x2   HYSTERECTOMY ABDOMINAL WITH SALPINGO-OOPHORECTOMY N/A 01/26/2018   Procedure: SUPRACERVICAL HYSTERECTOMY ABDOMINAL WITH SALPINGO-OOPHORECTOMY;  Surgeon: [name], MD;  Location: [facility];  Service: Gynecology;  Laterality: N/A;   LYSIS OF ADHESION N/A 01/26/2018   Procedure: EXTENSIVE LYSIS OF ADHESIONS;  Surgeon: [name], MD;  Location: [facility];  Service: Gynecology;  Laterality: N/A;   THYROIDECTOMY  11/18/2016   THYROIDECTOMY N/A  11/18/2016   Procedure: TOTAL THYROIDECTOMY;  Surgeon: Armandina Gemma, MD;  Location: [facility];  Service: General;  Laterality: N/A;   THYROIDECTOMY     Social History   Socioeconomic History   Marital status: Married    Spouse name: Not on file   Number of children: 3   Years of education: Not on file   Highest education level: Not on file  Occupational History   Occupation: Airline pilot: [employer]  Tobacco Use   Smoking status: Never   Smokeless tobacco: Never  Vaping Use   Vaping Use: Never used  Substance and Sexual Activity   Alcohol use: No   Drug use: No   Sexual activity: Yes    Birth control/protection: None  Other Topics Concern   Not on file  Social History Narrative   One level home; lives with daughter and  Husband; some college; coffee daily 1 cup occ soft drinks; exercise none   Left handed   Social Determinants of Health   Financial Resource Strain: Not on file  Food Insecurity: Not on file  Transportation Needs: Not on file  Physical Activity: Not on file  Stress: Not on file  Social Connections: Not on file   Family History  Problem  Relation Age of Onset   Diabetes Mother    Hypertension Mother    Thyroid disease Mother    Allergies  Allergen Reactions   No Known Allergies    Gadolinium Derivatives Other (See Comments)    Temporary burning sensation radiating up arm   Current Outpatient Medications  Medication Sig Dispense Refill   aspirin-acetaminophen-caffeine (EXCEDRIN MIGRAINE) 250-250-65 MG tablet Take 2 tablets by mouth every 6 (six) hours as needed for headache.     atenolol-chlorthalidone (TENORETIC) 50-25 MG tablet Take by mouth.     Fremanezumab-vfrm (AJOVY) 225 MG/1.5ML SOAJ Inject 225 mg into the skin every 28 (twenty-eight) days. 5.04 mL 1   ibuprofen (ADVIL,MOTRIN) 200 MG tablet Take 800 mg by mouth every 8 (eight) hours as needed (for headache).     levothyroxine (SYNTHROID) 112 MCG tablet Take 112 mcg by mouth  daily before breakfast.     promethazine (PHENERGAN) 12.5 MG tablet Take 1 tablet (12.5 mg total) by mouth every 8 (eight) hours as needed for nausea or vomiting. 90 tablet 3   Ubrogepant (UBRELVY) 100 MG TABS Take 100 mg by mouth as needed (As needed for Mirgrine.may repeat in 2 hours, maximum 2 tablets in 24 hours). 16 tablet 5   No current facility-administered medications for this visit.   No results found.  Review of Systems:   A ROS was performed including pertinent positives and negatives as documented in the HPI.  Physical Exam :   Constitutional: NAD and appears stated age Neurological: Alert and oriented Psych: Appropriate affect and cooperative There were no vitals taken for this visit.   Comprehensive Musculoskeletal Exam:    Musculoskeletal Exam    Inspection Right Left  Skin No atrophy or winging No atrophy or winging  Palpation    Tenderness None Glenohumeral  Range of Motion    Flexion (passive) 170 150  Flexion (active) 170 150  Abduction 170 130  ER at the side 70 40  Can reach behind back to T12 Back pocket  Strength     Full Painful  Special Tests    Pseudoparalytic No No  Neurologic    Fires PIN, radial, median, ulnar, musculocutaneous, axillary, suprascapular, long thoracic, and spinal accessory innervated muscles. No abnormal sensibility  Vascular/Lymphatic    Radial Pulse 2+ 2+  Cervical Exam    Patient has symmetric cervical range of motion with negative Spurling's test.  Special Test: Pain with passive external rotation     Imaging:   Xray (3 views left shoulder): Normal    I personally reviewed and interpreted the radiographs.   Assessment:   56 year old female with left shoulder pain consistent with adhesive capsulitis.  At today's visit I recommend an additional ultrasound-guided injection.  She will continue to work on gentle passive and active assisted range of motion I will see her back on an as-needed basis  Plan :    -Plan  for left shoulder ultrasound-guided injection for adhesive capsulitis    Procedure Note  Patient: [name]             Date of Birth: May 08, 1966           MRN: [id number]             Visit Date: 07/11/2022  Procedures: Visit Diagnoses: No diagnosis found.  Large Joint Inj: L glenohumeral on 07/11/2022 3:19 PM Indications: pain Details: 22 G 1.5 in needle, ultrasound-guided anterior approach  Arthrogram: No  Medications: 4 mL lidocaine 1 %; 80 mg  triamcinolone acetonide 40 MG/ML Outcome: tolerated well, no immediate complications Procedure, treatment alternatives, risks and benefits explained, specific risks discussed. Consent was given by the patient. Immediately prior to procedure a time out was called to verify the correct patient, procedure, equipment, support staff and site/side marked as required. Patient was prepped and draped in the usual sterile fashion.         I personally saw and evaluated the patient, and participated in the management and treatment plan.  Vanetta Mulders, MD Attending Physician, Orthopedic Surgery  This document was dictated using Dragon voice recognition software. A reasonable attempt at proof reading has been made to minimize errors.

## 2022-07-29 ENCOUNTER — Telehealth: Payer: BC Managed Care – PPO | Admitting: Physician Assistant

## 2022-07-29 DIAGNOSIS — B3731 Acute candidiasis of vulva and vagina: Secondary | ICD-10-CM | POA: Diagnosis not present

## 2022-07-29 MED ORDER — FLUCONAZOLE 150 MG PO TABS: 150.0000 mg | ORAL_TABLET | Freq: Once | ORAL | 0 refills | Status: AC

## 2022-07-29 NOTE — Progress Notes (Signed)
I have spent 5 minutes in review of e-visit questionnaire, review and updating patient chart, medical decision making and response to patient.   Alan Riles Cody Malissie Musgrave, PA-C    

## 2022-07-29 NOTE — Progress Notes (Signed)

## 2022-07-30 ENCOUNTER — Telehealth: Payer: BC Managed Care – PPO | Admitting: Family

## 2022-07-30 DIAGNOSIS — R300 Dysuria: Secondary | ICD-10-CM

## 2022-07-30 NOTE — Progress Notes (Signed)
Given your symptoms have not improved, I feel your condition warrants further evaluation and I recommend that you be seen in a face to face visit.   NOTE: There will be NO CHARGE for this eVisit   If you are having a true medical emergency please call 911.      For an urgent face to face visit, [facility] has seven urgent care centers for your convenience:     [facility] [place] [facility] [place] Get Driving Directions [phone] [place], [place] [address]    [facility]) Get Driving Directions [phone] [place], [place] [address]  [facility] [place] [facility]) Get Driving Directions [phone] [address] [place],  [place]  [address]  [facility] at [address] [place] Get Driving Directions  [phone] [place],  [place] [address]   [facility] Get Driving Directions [phone] [address], [place], [place] [address]   [facility] Get Driving Directions  [phone] [address] [place], [place] [address]   [facility] Get Driving Directions [phone] [address], [place], [place] [address]  Your MyChart E-visit questionnaire answers were reviewed by a board certified advanced clinical practitioner to complete your personal care plan based on your specific symptoms.  Thank you for using e-Visits.

## 2022-07-31 DIAGNOSIS — N3001 Acute cystitis with hematuria: Secondary | ICD-10-CM | POA: Diagnosis not present

## 2022-07-31 DIAGNOSIS — R300 Dysuria: Secondary | ICD-10-CM | POA: Diagnosis not present

## 2022-07-31 DIAGNOSIS — I10 Essential (primary) hypertension: Secondary | ICD-10-CM | POA: Diagnosis not present

## 2022-08-22 DIAGNOSIS — E119 Type 2 diabetes mellitus without complications: Secondary | ICD-10-CM | POA: Diagnosis not present

## 2022-08-22 DIAGNOSIS — Z6841 Body Mass Index (BMI) 40.0 and over, adult: Secondary | ICD-10-CM | POA: Diagnosis not present

## 2022-08-22 DIAGNOSIS — I10 Essential (primary) hypertension: Secondary | ICD-10-CM | POA: Diagnosis not present

## 2022-09-26 DIAGNOSIS — E890 Postprocedural hypothyroidism: Secondary | ICD-10-CM | POA: Diagnosis not present

## 2022-09-26 DIAGNOSIS — I10 Essential (primary) hypertension: Secondary | ICD-10-CM | POA: Diagnosis not present

## 2022-09-26 DIAGNOSIS — R5383 Other fatigue: Secondary | ICD-10-CM | POA: Diagnosis not present

## 2022-09-26 DIAGNOSIS — R7303 Prediabetes: Secondary | ICD-10-CM | POA: Diagnosis not present

## 2022-09-26 DIAGNOSIS — R635 Abnormal weight gain: Secondary | ICD-10-CM | POA: Diagnosis not present

## 2022-09-29 NOTE — Progress Notes (Unsigned)
NEUROLOGY FOLLOW UP OFFICE NOTE  [name] [id number]  Assessment/Plan:   Migraine without aura, without status migrainosus, not intractable   Migraine prevention:  *** Migraine rescue:  Ubrelvy , promethazine for nausea Limit use of pain relievers to no more than 2 days out of week to prevent risk of rebound or medication-overuse headache. Keep headache diary Follow up 4 months     Subjective:  [name] is a 56 year old left-handed female with hypertension and acquired hypothyroidism who follows up for migraine.   UPDATE: Patient was supposed to check with her insurance to find out if either Ajovy or Emgality were covered.  ***  Rescue therapy:  Ubrelvy 2-3 days a week, takes Excedrin Migraine the other days a week Current NSAIDS: Elyxyb Current analgesics: Excedrin Current triptans: none Current ergotamine: none Current anti-emetic: Promethazine 12.5 mg Current muscle relaxants: None Current anti-anxiolytic: None Current sleep aide: None Current Antihypertensive medications: Atenolol- chlorthalidone Current Antidepressant medications: None Current Anticonvulsant medications: none Current anti-CGRP: None Current Vitamins/Herbal/Supplements: None Current Antihistamines/Decongestants: None Other therapy: Caffeine for abortive therapy, daith piercing Hormone/birth control: None   She reports chronic low back pain with pain radiating down the back of her left leg down to the ankle.  No preceding trauma.  No weakness.  She started exercising and working out 3 days a week and she has changed her diet.  She is doing better.   Caffeine: Coffee usually only for headache treatment Alcohol: None Smoker: None Diet: Sees nutritionist.  Keeps hydrated Exercise:Started exercising and working out 3 days a week with trainer. Depression: No; Anxiety: Mild.  Much better working from home.   Other pain: No Sleep hygiene: Okay   HISTORY:  Onset: She has had migraines  since age 61, however they have steadily gotten worse over the past year and have been daily since last week. Location:  Varies(either side, holocephalic).  Over the past week, she has had left sided neck pain Quality:  pounding Initial Intensity:  10/10; December: 4/10 mild, 10/10 severe Aura:  no Prodrome:  no Associated symptoms: Nausea, photophobia, sometimes vomiting Iniitial Duration:  Several hours to 2 days Initial Frequency:  Usually 2-3 times a month Triggers: Menstrual cycle Relieving factors:  Sleep, medication Activity:  Cannot function   Past NSAIDS:  Naproxen; diclofenac, Cambia, Elyxyb (effective but no longer available on market) Past analgesics:  Tylenol; hydrocodone Past abortive triptans:  Zomig '5mg'$  tablet (lost efficacy); Zomig '5mg'$  NS (effective but nasal spray irritant); sumatriptan NS; sumatriptan '100mg'$ ; eletriptan; Zembrace SymTouch (effective but makes her drowsy); rizatriptan '10mg'$  Past abortive ergotamine:  Trudhesa NS Past muscle relaxants:  none Past anti-emetic:  none Past antihypertensive medications:  metoprolol Past antidepressant medications:  Nortriptyline  Past anticonvulsant medications:  Trokendi XR '100mg'$  Past anti-CGRP:  Aimovig '140mg'$ , Nurtec Past vitamins/Herbal/Supplements:  Magnesium citrate; Co-Q 10; ribolfavin Past antihistamines/decongestants:  none Other past therapies:  none     Family history of headache:  No  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 Tomi Likens seeing pt. for HA management    High blood pressure    HYPERTENSION 06/14/2007   Hypothyroidism 2018   thyroidectomy    Migraines    Nodular goiter    Obesity    PONV (postoperative nausea and vomiting)    Vitamin D deficiency     MEDICATIONS: Current Outpatient Medications on File Prior to Visit  Medication Sig Dispense  Refill   aspirin-acetaminophen-caffeine (EXCEDRIN  MIGRAINE) 250-250-65 MG tablet Take 2 tablets by mouth every 6 (six) hours as needed for headache.     atenolol-chlorthalidone (TENORETIC) 50-25 MG tablet Take by mouth.     Fremanezumab-vfrm (AJOVY) 225 MG/1.5ML SOAJ Inject 225 mg into the skin every 28 (twenty-eight) days. 5.04 mL 1   ibuprofen (ADVIL,MOTRIN) 200 MG tablet Take 800 mg by mouth every 8 (eight) hours as needed (for headache).     levothyroxine (SYNTHROID) 112 MCG tablet Take 112 mcg by mouth daily before breakfast.     promethazine (PHENERGAN) 12.5 MG tablet Take 1 tablet (12.5 mg total) by mouth every 8 (eight) hours as needed for nausea or vomiting. 90 tablet 3   Ubrogepant (UBRELVY) 100 MG TABS Take 100 mg by mouth as needed (As needed for Mirgrine.may repeat in 2 hours, maximum 2 tablets in 24 hours). 16 tablet 5   No current facility-administered medications on file prior to visit.    ALLERGIES: Allergies  Allergen Reactions   No Known Allergies    Gadolinium Derivatives Other (See Comments)    Temporary burning sensation radiating up arm    FAMILY HISTORY: Family History  Problem Relation Age of Onset   Diabetes Mother    Hypertension Mother    Thyroid disease Mother       Objective:  *** General: No acute distress.  Patient appears ***-groomed.   Head:  Normocephalic/atraumatic Eyes:  Fundi examined but not visualized Neck: supple, no paraspinal tenderness, full range of motion Heart:  Regular rate and rhythm Lungs:  Clear to auscultation bilaterally Back: No paraspinal tenderness Neurological Exam: alert and oriented to person, place, and time.  Speech fluent and not dysarthric, language intact.  CN II-XII intact. Bulk and tone normal, muscle strength 5/5 throughout.  Sensation to light touch intact.  Deep tendon reflexes 2+ throughout, toes downgoing.  Finger to nose testing intact.  Gait normal, Romberg negative.   Metta Clines, DO  CC: ***

## 2022-09-30 ENCOUNTER — Ambulatory Visit: Payer: BC Managed Care – PPO | Admitting: Neurology

## 2022-09-30 ENCOUNTER — Encounter: Payer: Self-pay | Admitting: Neurology

## 2022-09-30 VITALS — BP 135/77 | HR 74 | Resp 18 | Ht 65.0 in

## 2022-09-30 DIAGNOSIS — G43709 Chronic migraine without aura, not intractable, without status migrainosus: Secondary | ICD-10-CM | POA: Diagnosis not present

## 2022-09-30 MED ORDER — UBRELVY 100 MG PO TABS: 100.0000 mg | ORAL_TABLET | ORAL | 11 refills | Status: DC | PRN

## 2022-09-30 MED ORDER — ELYXYB 120 MG/4.8ML PO SOLN: 120.0000 mg | Freq: Every day | ORAL | 11 refills | Status: DC | PRN

## 2022-09-30 NOTE — Patient Instructions (Signed)
Start Botox Use Ubrelvy or Elyxyb Follow up for Botox

## 2022-10-01 ENCOUNTER — Telehealth: Payer: Self-pay

## 2022-10-01 NOTE — Telephone Encounter (Signed)
Patient seen in the office 12/12, Per Dr.Jaffe patient to start Botox.  PA Team please start a PA for Botox 200 mg every 90 days.

## 2022-10-07 ENCOUNTER — Encounter: Payer: Self-pay | Admitting: Neurology

## 2022-10-14 ENCOUNTER — Telehealth: Payer: Self-pay

## 2022-10-14 ENCOUNTER — Encounter: Payer: Self-pay | Admitting: Neurology

## 2022-10-14 ENCOUNTER — Other Ambulatory Visit (HOSPITAL_COMMUNITY): Payer: Self-pay

## 2022-10-14 NOTE — Telephone Encounter (Signed)
BOTOXONE-Benefit Verification [id number] Submitted!

## 2022-10-14 NOTE — Telephone Encounter (Signed)
Pharmacy Patient Advocate Encounter   Received notification from [facility] that prior authorization for Botox 200UNIT solution is required/requested.    PA submitted on 10/14/2022 to (ins) [employer] via CoverMyMeds  Key [id number]  Status is pending

## 2022-10-15 ENCOUNTER — Other Ambulatory Visit (HOSPITAL_COMMUNITY): Payer: Self-pay

## 2022-10-15 MED ORDER — BOTOX 200 UNITS IJ SOLR: INTRAMUSCULAR | 4 refills | Status: DC

## 2022-10-15 NOTE — Telephone Encounter (Signed)
LMOM

## 2022-10-15 NOTE — Telephone Encounter (Signed)
Pharmacy Patient Advocate Encounter  Prior Authorization for Botox 200UNIT solution has been approved.    PA# PA Case ID: [id number]  Effective dates: 10/14/2022 through 01/13/2023

## 2022-10-17 ENCOUNTER — Other Ambulatory Visit (HOSPITAL_COMMUNITY): Payer: Self-pay

## 2022-10-21 ENCOUNTER — Telehealth: Payer: Self-pay

## 2022-10-21 NOTE — Telephone Encounter (Signed)
Spoke to [employer] to have delivery set for Botox. Per Rep: [facility] having a rejection from Insurance Botox already paid out at another pharmacy. Advised rep script sent to [facility] only. Ask to have a rush on order.  LMOVm for patient.

## 2022-10-23 ENCOUNTER — Telehealth: Payer: Self-pay

## 2022-10-23 NOTE — Telephone Encounter (Signed)
[facility] [phone] to arrange delivery for patients Botox. Patients 200 units Botox is scheduled to be delivered on 10/28/2022. Patient consent given.

## 2022-10-23 NOTE — Telephone Encounter (Signed)
Called Pt and she is aware of appointment , said that she called company for a coupon and it wait on that but she will be here for Botox either way.,

## 2022-10-31 ENCOUNTER — Ambulatory Visit: Payer: BC Managed Care – PPO | Admitting: Neurology

## 2022-10-31 DIAGNOSIS — G43709 Chronic migraine without aura, not intractable, without status migrainosus: Secondary | ICD-10-CM | POA: Diagnosis not present

## 2022-10-31 MED ORDER — ONABOTULINUMTOXINA 100 UNITS IJ SOLR
200.0000 [IU] | Freq: Once | INTRAMUSCULAR | Status: AC
  Administered 2022-10-31: 155 [IU] via INTRAMUSCULAR

## 2022-10-31 NOTE — Addendum Note (Signed)
Addended by: Venetia Night on: 10/31/2022 03:55 PM   Modules accepted: Orders

## 2022-10-31 NOTE — Progress Notes (Signed)
[facility]  ° °Procedure Note Botox ° °Attending: Dr. Marquerite Forsman ° °Preoperative Diagnosis(es): Chronic migraine ° °Consent obtained from: The patient °Benefits discussed included, but were not limited to decreased muscle tightness, increased joint range of motion, and decreased pain.  Risk discussed included, but were not limited pain and discomfort, bleeding, bruising, excessive weakness, venous thrombosis, muscle atrophy and dysphagia.  Anticipated outcomes of the procedure as well as he risks and benefits of the alternatives to the procedure, and the roles and tasks of the personnel to be involved, were discussed with the patient, and the patient consents to the procedure and agrees to proceed. A copy of the patient medication guide was given to the patient which explains the blackbox warning. ° °Patients identity and treatment sites confirmed Yes.  . ° °Details of Procedure: °Skin was cleaned with alcohol. Prior to injection, the needle plunger was aspirated to make sure the needle was not within a blood vessel.  There was no blood retrieved on aspiration.   ° °Following is a summary of the muscles injected  And the amount of Botulinum toxin used: ° °Dilution °200 units of Botox was reconstituted with 4 ml of preservative free normal saline. °Time of reconstitution: At the time of the office visit (<30 minutes prior to injection)  ° °Injections  °155 total units of Botox was injected with a 30 gauge needle. ° °Injection Sites: °L occipitalis: 15 units- 3 sites  °R occiptalis: 15 units- 3 sites ° °L upper trapezius: 15 units- 3 sites °R upper trapezius: 15 units- 3 sits          °L paraspinal: 10 units- 2 sites °R paraspinal: 10 units- 2 sites ° °Face °L frontalis(2 injection sites):10 units   °R frontalis(2 injection sites):10 units         °L corrugator: 5 units   °R corrugator: 5 units           °Procerus: 5 units   °L temporalis: 20 units °R temporalis: 20 units  ° °Agent:  °200 units of botulinum Type  A (Onobotulinum Toxin type A) was reconstituted with 4 ml of preservative free normal saline.  °Time of reconstitution: At the time of the office visit (<30 minutes prior to injection)  ° ° ° Total injected (Units):  155 ° Total wasted (Units):  45 ° °Patient tolerated procedure well without complications.   °Reinjection is anticipated in 3 months. ° ° °

## 2022-11-24 ENCOUNTER — Other Ambulatory Visit (HOSPITAL_COMMUNITY): Payer: Self-pay

## 2022-11-27 DIAGNOSIS — R7303 Prediabetes: Secondary | ICD-10-CM | POA: Diagnosis not present

## 2022-11-27 DIAGNOSIS — E559 Vitamin D deficiency, unspecified: Secondary | ICD-10-CM | POA: Diagnosis not present

## 2022-11-27 DIAGNOSIS — Z0000 Encounter for general adult medical examination without abnormal findings: Secondary | ICD-10-CM | POA: Diagnosis not present

## 2022-11-27 DIAGNOSIS — E890 Postprocedural hypothyroidism: Secondary | ICD-10-CM | POA: Diagnosis not present

## 2022-11-27 DIAGNOSIS — R5383 Other fatigue: Secondary | ICD-10-CM | POA: Diagnosis not present

## 2022-11-27 DIAGNOSIS — I10 Essential (primary) hypertension: Secondary | ICD-10-CM | POA: Diagnosis not present

## 2022-11-27 DIAGNOSIS — Z1330 Encounter for screening examination for mental health and behavioral disorders, unspecified: Secondary | ICD-10-CM | POA: Diagnosis not present

## 2022-12-03 DIAGNOSIS — E890 Postprocedural hypothyroidism: Secondary | ICD-10-CM | POA: Diagnosis not present

## 2022-12-03 DIAGNOSIS — J069 Acute upper respiratory infection, unspecified: Secondary | ICD-10-CM | POA: Diagnosis not present

## 2022-12-22 ENCOUNTER — Encounter: Payer: Self-pay | Admitting: Neurology

## 2022-12-23 ENCOUNTER — Telehealth: Payer: Self-pay

## 2022-12-23 NOTE — Telephone Encounter (Signed)
Pt left message with AN stating she would like to get some samples of Elxyb.

## 2022-12-23 NOTE — Telephone Encounter (Signed)
Patient called in wondering if she can come by tomorrow to get a migraine cocktail.

## 2022-12-23 NOTE — Telephone Encounter (Signed)
Per Patient [name] needs a PA.

## 2022-12-23 NOTE — Telephone Encounter (Signed)
Patient advised once Dr.Jaffe approves the Cocktail we will have the front call top schedule.

## 2022-12-24 ENCOUNTER — Ambulatory Visit (INDEPENDENT_AMBULATORY_CARE_PROVIDER_SITE_OTHER): Payer: BC Managed Care – PPO

## 2022-12-24 ENCOUNTER — Telehealth: Payer: Self-pay

## 2022-12-24 DIAGNOSIS — G43709 Chronic migraine without aura, not intractable, without status migrainosus: Secondary | ICD-10-CM

## 2022-12-24 MED ORDER — DIPHENHYDRAMINE HCL 50 MG/ML IJ SOLN
50.0000 mg | Freq: Once | INTRAMUSCULAR | Status: AC
  Administered 2022-12-24: 25 mg via INTRAMUSCULAR

## 2022-12-24 MED ORDER — KETOROLAC TROMETHAMINE 60 MG/2ML IM SOLN
60.0000 mg | Freq: Once | INTRAMUSCULAR | Status: AC
  Administered 2022-12-24: 60 mg via INTRAMUSCULAR

## 2022-12-24 MED ORDER — METOCLOPRAMIDE HCL 5 MG/ML IJ SOLN
10.0000 mg | Freq: Once | INTRAMUSCULAR | Status: AC
  Administered 2022-12-24: 10 mg via INTRAMUSCULAR

## 2022-12-24 NOTE — Telephone Encounter (Signed)
Rax received from [facility] PA needed for Botox

## 2022-12-24 NOTE — Telephone Encounter (Signed)
Per Dr.Jaffe patient okay to come in office for a headache cocktail.

## 2022-12-26 ENCOUNTER — Other Ambulatory Visit (HOSPITAL_COMMUNITY): Payer: Self-pay

## 2022-12-31 ENCOUNTER — Other Ambulatory Visit (HOSPITAL_COMMUNITY): Payer: Self-pay

## 2022-12-31 ENCOUNTER — Telehealth: Payer: Self-pay

## 2022-12-31 NOTE — Telephone Encounter (Signed)
Patient Advocate Encounter   Received notification from [facility] that prior authorization is required for Elyxyb '120MG'$ /4.8ML solution  Submitted: 12-31-2022 Key [id number]   Status is pending

## 2023-01-01 ENCOUNTER — Telehealth: Payer: Self-pay

## 2023-01-01 NOTE — Telephone Encounter (Addendum)
Per letter received from [facility], [place] needed for Botox 200 units.   Per Patient,  Good morning,I called [facility] of course they are waiting for a response from the office, anyway they want to know if I take as much medicine as before Botox .The answer is no before for 3 months I started my day with ubrelvy now not nearly as much.It really has made a difference in the quality of my life.

## 2023-01-01 NOTE — Telephone Encounter (Signed)
Patient Advocate Encounter  Prior Authorization for Elyxyb '120MG'$ /4.8ML solution has been approved through [facility].  KeyZB:[id number]    Effective: 01-01-2023 to 12-31-2023

## 2023-01-01 NOTE — Telephone Encounter (Signed)
Patient advised of the approval.

## 2023-01-05 NOTE — Telephone Encounter (Signed)
Submitted a Prior Authorization request to [facility] for  Botox 200 unit  via CoverMyMeds. Will update once we receive a response.  Key: [id number]

## 2023-01-05 NOTE — Telephone Encounter (Signed)
Duplicate encounter- PA submitted on 3/18

## 2023-01-09 ENCOUNTER — Telehealth: Payer: Self-pay | Admitting: Pharmacy Technician

## 2023-01-09 ENCOUNTER — Other Ambulatory Visit (HOSPITAL_COMMUNITY): Payer: Self-pay

## 2023-01-09 NOTE — Telephone Encounter (Signed)
Patient Advocate Encounter  Received notification from [facility] that prior authorization for UBRELVY 100MG  is required.   PA submitted on 3.22.24 Key [id number] Status is pending

## 2023-01-13 ENCOUNTER — Encounter: Payer: Self-pay | Admitting: Neurology

## 2023-01-15 NOTE — Telephone Encounter (Signed)
Patient advised of approval.  

## 2023-01-15 NOTE — Telephone Encounter (Signed)
PA Approved through 01/08/24

## 2023-01-15 NOTE — Telephone Encounter (Signed)
Received denial due to plan wanting additional information, due to no official documentation in chart notes since start of therapy that Botox is working. Do not see where a call or fax was received by the PA Team or the clinic for requested info.   Submitted expedited appeal via Covermymeds. Will update once we receive a response. Key: [id number]

## 2023-01-15 NOTE — Telephone Encounter (Signed)
Patient Advocate Encounter  Prior Authorization Appeal for Botox 200UNIT solution has been approved.    [employer]  Effective: 01-05-2023 to 04-17-2023

## 2023-02-02 ENCOUNTER — Other Ambulatory Visit: Payer: Self-pay | Admitting: Neurology

## 2023-02-02 MED ORDER — ELYXYB 120 MG/4.8ML PO SOLN: 120.0000 mg | Freq: Every day | ORAL | 3 refills | Status: DC | PRN

## 2023-02-03 ENCOUNTER — Other Ambulatory Visit (HOSPITAL_COMMUNITY): Payer: Self-pay

## 2023-02-03 NOTE — Telephone Encounter (Signed)
[facility] is calling in to schedule delivery, call back [phone].

## 2023-02-05 ENCOUNTER — Other Ambulatory Visit (HOSPITAL_COMMUNITY): Payer: Self-pay

## 2023-02-06 ENCOUNTER — Ambulatory Visit: Payer: BC Managed Care – PPO | Admitting: Neurology

## 2023-02-06 DIAGNOSIS — G43709 Chronic migraine without aura, not intractable, without status migrainosus: Secondary | ICD-10-CM | POA: Diagnosis not present

## 2023-02-06 MED ORDER — ONABOTULINUMTOXINA 100 UNITS IJ SOLR
200.0000 [IU] | Freq: Once | INTRAMUSCULAR | Status: AC
  Administered 2023-02-06: 155 [IU] via INTRAMUSCULAR

## 2023-02-06 NOTE — Progress Notes (Signed)
[facility]  ° °Procedure Note Botox ° °Attending: Dr. Caylyn Tedeschi ° °Preoperative Diagnosis(es): Chronic migraine ° °Consent obtained from: The patient °Benefits discussed included, but were not limited to decreased muscle tightness, increased joint range of motion, and decreased pain.  Risk discussed included, but were not limited pain and discomfort, bleeding, bruising, excessive weakness, venous thrombosis, muscle atrophy and dysphagia.  Anticipated outcomes of the procedure as well as he risks and benefits of the alternatives to the procedure, and the roles and tasks of the personnel to be involved, were discussed with the patient, and the patient consents to the procedure and agrees to proceed. A copy of the patient medication guide was given to the patient which explains the blackbox warning. ° °Patients identity and treatment sites confirmed Yes.  . ° °Details of Procedure: °Skin was cleaned with alcohol. Prior to injection, the needle plunger was aspirated to make sure the needle was not within a blood vessel.  There was no blood retrieved on aspiration.   ° °Following is a summary of the muscles injected  And the amount of Botulinum toxin used: ° °Dilution °200 units of Botox was reconstituted with 4 ml of preservative free normal saline. °Time of reconstitution: At the time of the office visit (<30 minutes prior to injection)  ° °Injections  °155 total units of Botox was injected with a 30 gauge needle. ° °Injection Sites: °L occipitalis: 15 units- 3 sites  °R occiptalis: 15 units- 3 sites ° °L upper trapezius: 15 units- 3 sites °R upper trapezius: 15 units- 3 sits          °L paraspinal: 10 units- 2 sites °R paraspinal: 10 units- 2 sites ° °Face °L frontalis(2 injection sites):10 units   °R frontalis(2 injection sites):10 units         °L corrugator: 5 units   °R corrugator: 5 units           °Procerus: 5 units   °L temporalis: 20 units °R temporalis: 20 units  ° °Agent:  °200 units of botulinum Type  A (Onobotulinum Toxin type A) was reconstituted with 4 ml of preservative free normal saline.  °Time of reconstitution: At the time of the office visit (<30 minutes prior to injection)  ° ° ° Total injected (Units):  155 ° Total wasted (Units):  45 ° °Patient tolerated procedure well without complications.   °Reinjection is anticipated in 3 months. ° ° °

## 2023-03-13 DIAGNOSIS — E559 Vitamin D deficiency, unspecified: Secondary | ICD-10-CM | POA: Diagnosis not present

## 2023-03-13 DIAGNOSIS — R5383 Other fatigue: Secondary | ICD-10-CM | POA: Diagnosis not present

## 2023-03-13 DIAGNOSIS — I10 Essential (primary) hypertension: Secondary | ICD-10-CM | POA: Diagnosis not present

## 2023-03-13 DIAGNOSIS — E890 Postprocedural hypothyroidism: Secondary | ICD-10-CM | POA: Diagnosis not present

## 2023-03-13 DIAGNOSIS — R7303 Prediabetes: Secondary | ICD-10-CM | POA: Diagnosis not present

## 2023-04-01 ENCOUNTER — Ambulatory Visit: Payer: BC Managed Care – PPO | Admitting: Neurology

## 2023-04-08 ENCOUNTER — Other Ambulatory Visit (HOSPITAL_COMMUNITY): Payer: Self-pay

## 2023-04-08 MED ORDER — ELYXYB 120 MG/4.8ML PO SOLN
67.2000 mg | Freq: Every day | ORAL | 0 refills | Status: DC
  Filled 2023-04-08: qty 4.8, 30d supply, fill #0

## 2023-04-09 ENCOUNTER — Other Ambulatory Visit: Payer: Self-pay

## 2023-04-09 MED ORDER — ELYXYB 120 MG/4.8ML PO SOLN: 120.0000 mg | Freq: Every day | ORAL | 3 refills | Status: DC | PRN

## 2023-04-09 NOTE — Telephone Encounter (Signed)
Per Request from [facility] Elyxyb currently out of stock.   Okay to send to local pharmacy.   New order sent.

## 2023-04-10 DIAGNOSIS — I10 Essential (primary) hypertension: Secondary | ICD-10-CM | POA: Diagnosis not present

## 2023-04-10 DIAGNOSIS — E1165 Type 2 diabetes mellitus with hyperglycemia: Secondary | ICD-10-CM | POA: Diagnosis not present

## 2023-04-10 DIAGNOSIS — G4700 Insomnia, unspecified: Secondary | ICD-10-CM | POA: Diagnosis not present

## 2023-04-10 DIAGNOSIS — M25512 Pain in left shoulder: Secondary | ICD-10-CM | POA: Diagnosis not present

## 2023-05-04 ENCOUNTER — Telehealth: Payer: Self-pay | Admitting: Pharmacy Technician

## 2023-05-04 ENCOUNTER — Other Ambulatory Visit (HOSPITAL_COMMUNITY): Payer: Self-pay

## 2023-05-04 NOTE — Telephone Encounter (Signed)
[employer] verification has been submitted. Benefit Verification:   [id number]  Pharmacy PA has been submitted for BOTOX 200U via CMM. INSURANCE: [facility] DATE SUBMITTED: 7.15.24 KEY: [id number]  Status is pending

## 2023-05-05 NOTE — Telephone Encounter (Signed)
Pharmacy Patient Advocate Encounter  Received notification from [facility] that Prior Authorization for BOTOX 200U has been DENIED:  PA #/Case ID/Reference #: [id number]  Please be advised we currently do not have a Pharmacist to review denials, therefore you will need to process appeals accordingly as needed. Thanks for your support at this time. Contact for appeals are as follows: Phone: [phone], Fax: [phone]

## 2023-05-05 NOTE — Telephone Encounter (Signed)
7/19 patient will do a follow up visit to send records to the insurance company for botox approval.

## 2023-05-08 ENCOUNTER — Encounter: Payer: Self-pay | Admitting: Neurology

## 2023-05-08 ENCOUNTER — Ambulatory Visit (INDEPENDENT_AMBULATORY_CARE_PROVIDER_SITE_OTHER): Payer: BC Managed Care – PPO | Admitting: Neurology

## 2023-05-08 VITALS — BP 110/76 | HR 96 | Ht 65.0 in | Wt 241.2 lb

## 2023-05-08 DIAGNOSIS — G43709 Chronic migraine without aura, not intractable, without status migrainosus: Secondary | ICD-10-CM

## 2023-05-08 NOTE — Telephone Encounter (Signed)
Patient seen in office today, Patient to continue botox.   PA team please send in new office notes. Patient botox  appt is scheduled for 7/30.

## 2023-05-08 NOTE — Progress Notes (Signed)
NEUROLOGY FOLLOW UP OFFICE NOTE  [name] [id number]  Assessment/Plan:   Migraine without aura, without status migrainosus, not intractable - significantly improved on Botox   Migraine prevention:  Botox.   Migraine rescue:  Tylenol/coffee first line, Ubrelvy first line or second line Limit use of pain relievers to no more than 2 days out of week to prevent risk of rebound or medication-overuse headache. Keep headache diary Follow up 6 months.     Subjective:  [name] is a 57 year old left-handed female with hypertension and acquired hypothyroidism who follows up for migraine.   UPDATE: Started Botox.  Status post 2 rounds.  Improvement. Intensity:  severe Duration:  1 hour with Tylenol and coffee, sometimes Ubrelvy  Frequency:  4 days a month Rescue therapy:  Tylenol/coffee first line, Ubrelvy second line Current NSAIDS: none Current analgesics: Tylenol, Excedrin, ibuprofen, tramadol Current triptans: none Current ergotamine: none Current anti-emetic: Promethazine 12.5 mg Current muscle relaxants: None Current Antihypertensive medications: Atenolol- chlorthalidone Current Antidepressant medications: None Current Anticonvulsant medications: Wellbutrin XL 150mg  daily Current anti-CGRP: Ubrelvy 100mg  Current Vitamins/Herbal/Supplements: None Current Antihistamines/Decongestants: None Other therapy: Caffeine for abortive therapy, daith piercing Hormone/birth control: None Other medications:  Hydroxyzine, Synthroid, Mounjaro   She reports chronic low back pain with pain radiating down the back of her left leg down to the ankle.  No preceding trauma.  No weakness.  She started exercising and working out 3 days a week and she has changed her diet.  She is doing better.   Caffeine: Coffee usually only for headache treatment Alcohol: None Smoker: None Diet: Sees nutritionist.  Keeps hydrated Exercise:Started exercising and working out 3 days a week with  trainer. Depression: No; Anxiety: Mild.  Much better working from home.   Other pain: No Sleep hygiene: Trouble sleeping.  Started hydroxyzine or magnesium powder which helps.   HISTORY:  Onset: She has had migraines since age 53, however they have steadily gotten worse over the past year and have been daily since last week. Location:  Varies(either side, holocephalic).  Over the past week, she has had left sided neck pain Quality:  pounding Initial Intensity:  10/10; December: 4/10 mild, 10/10 severe Aura:  no Prodrome:  no Associated symptoms: Nausea, photophobia, sometimes vomiting Iniitial Duration:  Several hours to 2 days Initial Frequency:  Usually 2-3 times a month Triggers: Menstrual cycle Relieving factors:  Sleep, medication Activity:  Cannot function   Past NSAIDS:  Naproxen; diclofenac, Cambia, Elyxyb Past analgesics:  Tylenol; hydrocodone Past abortive triptans:  Zomig 5mg  tablet (lost efficacy); Zomig 5mg  NS (effective but nasal spray irritant); sumatriptan NS; sumatriptan 100mg ; eletriptan; Zembrace SymTouch (effective but makes her drowsy); rizatriptan 10mg  Past abortive ergotamine:  Trudhesa NS Past muscle relaxants:  none Past anti-emetic:  none Past antihypertensive medications:  metoprolol Past antidepressant medications:  Nortriptyline  Past anticonvulsant medications:  Trokendi XR 100mg  Past anti-CGRP:  Aimovig 140mg , Nurtec Past vitamins/Herbal/Supplements:  Magnesium citrate; Co-Q 10; ribolfavin Past antihistamines/decongestants:  none Other past therapies:  none  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 Everlena Cooper seeing pt. for HA management    High blood pressure    HYPERTENSION 06/14/2007   Hypothyroidism 2018   thyroidectomy    Migraines    Nodular goiter    Obesity    PONV (postoperative nausea and vomiting)    Vitamin D deficiency      MEDICATIONS: Current  Outpatient Medications on File Prior to Visit  Medication Sig Dispense Refill   aspirin-acetaminophen-caffeine (EXCEDRIN MIGRAINE) 250-250-65 MG tablet Take 2 tablets by mouth every 6 (six) hours as needed for headache.     atenolol-chlorthalidone (TENORETIC) 50-25 MG tablet Take by mouth.     botulinum toxin Type A (BOTOX) 200 units injection Inject 155 units IM into multiple site in the face,neck and head once every 90 days 1 each 4   Celecoxib (ELYXYB) 120 MG/4.8ML SOLN Take 120 mg by mouth daily as needed. 67.2 mL 3   ibuprofen (ADVIL,MOTRIN) 200 MG tablet Take 800 mg by mouth every 8 (eight) hours as needed (for headache).     levothyroxine (SYNTHROID) 112 MCG tablet Take 112 mcg by mouth daily before breakfast.     promethazine (PHENERGAN) 12.5 MG tablet Take 1 tablet (12.5 mg total) by mouth every 8 (eight) hours as needed for nausea or vomiting. 90 tablet 3   Ubrogepant (UBRELVY) 100 MG TABS Take 100 mg by mouth as needed (As needed for Mirgrine.may repeat in 2 hours, maximum 2 tablets in 24 hours). 16 tablet 11   No current facility-administered medications on file prior to visit.    ALLERGIES: Allergies  Allergen Reactions   No Known Allergies    Gadolinium Derivatives Other (See Comments)    Temporary burning sensation radiating up arm    FAMILY HISTORY: Family History  Problem Relation Age of Onset   Diabetes Mother    Hypertension Mother    Thyroid disease Mother       Objective:  Blood pressure 110/76, pulse 96, height 5\' 5"  (1.651 m), weight 241 lb 3.2 oz (109.4 kg), SpO2 95%. General: No acute distress.  Patient appears well-groomed.    Shon Millet, DO

## 2023-05-12 ENCOUNTER — Other Ambulatory Visit (HOSPITAL_COMMUNITY): Payer: Self-pay

## 2023-05-12 NOTE — Telephone Encounter (Signed)
Pharmacy Patient Advocate Encounter   Received notification from CoverMyMeds that prior authorization for BOTOX 200U is required/requested.   Insurance verification completed.   The patient is insured through [facility] .   Per test claim: PA submitted to [facility] via CoverMyMeds Key/confirmation #/EOC [id number] Status is pending

## 2023-05-12 NOTE — Telephone Encounter (Signed)
Unable to submit a new PA for Botox, instead did appeal with updated chart notes submitted.   KEY: [id number]  Status is pending.

## 2023-05-13 ENCOUNTER — Other Ambulatory Visit (HOSPITAL_COMMUNITY): Payer: Self-pay

## 2023-05-13 NOTE — Telephone Encounter (Signed)
LMOVM approval.

## 2023-05-13 NOTE — Telephone Encounter (Signed)
Pharmacy Patient Advocate Encounter- Botox BIV-Pharmacy Benefit:  Appeal was submitted to [facility] and has been approved through: 7.15.24 - 10.24.24 Authorization# [id number]  Please send prescription to Specialty Pharmacy: [facility]: [phone]  Estimated Copay is: $0

## 2023-05-19 ENCOUNTER — Ambulatory Visit: Payer: BC Managed Care – PPO | Admitting: Neurology

## 2023-05-22 ENCOUNTER — Ambulatory Visit: Payer: BC Managed Care – PPO | Admitting: Neurology

## 2023-05-22 DIAGNOSIS — G43709 Chronic migraine without aura, not intractable, without status migrainosus: Secondary | ICD-10-CM | POA: Diagnosis not present

## 2023-05-22 MED ORDER — ONABOTULINUMTOXINA 100 UNITS IJ SOLR
200.0000 [IU] | Freq: Once | INTRAMUSCULAR | Status: AC
Start: 2023-05-22 — End: 2023-05-22
  Administered 2023-05-22: 155 [IU] via INTRAMUSCULAR

## 2023-05-22 NOTE — Progress Notes (Signed)

## 2023-06-02 DIAGNOSIS — E890 Postprocedural hypothyroidism: Secondary | ICD-10-CM | POA: Diagnosis not present

## 2023-06-02 DIAGNOSIS — E1165 Type 2 diabetes mellitus with hyperglycemia: Secondary | ICD-10-CM | POA: Diagnosis not present

## 2023-06-02 DIAGNOSIS — I10 Essential (primary) hypertension: Secondary | ICD-10-CM | POA: Diagnosis not present

## 2023-07-17 ENCOUNTER — Telehealth: Payer: Self-pay

## 2023-07-17 NOTE — Telephone Encounter (Signed)
Telephone call to the patient pharmacy [facility], Medication received at the office Bupropion XL 150 mg. This medication not prescribed buy Dr.Jaffe.   Option is to send the medication back to the pharmacy or call and ask patient to come pick up.   Advised Rep Celina the provider is out of the office and since he did not prescribed the medication I do not feel comfort saying okay to give a medication that we do prescribed to her.     Kit ordered to have medication sent back to [facility].     Patient advised, to call to make that another shipment will be sent to her home.

## 2023-08-07 ENCOUNTER — Ambulatory Visit: Payer: BC Managed Care – PPO | Admitting: Neurology

## 2023-08-13 ENCOUNTER — Telehealth: Payer: Self-pay

## 2023-08-13 NOTE — Telephone Encounter (Signed)
PA expired 08/13/23, New PA needed for visit 11/15

## 2023-08-21 ENCOUNTER — Other Ambulatory Visit (HOSPITAL_COMMUNITY): Payer: Self-pay

## 2023-08-21 ENCOUNTER — Telehealth: Payer: Self-pay | Admitting: Pharmacy Technician

## 2023-08-21 NOTE — Telephone Encounter (Signed)
PA has been submitted.

## 2023-08-21 NOTE — Telephone Encounter (Signed)
Pharmacy Patient Advocate Encounter   Received notification from Pt Calls Messages that prior authorization for BOTOX 200 is required/requested.   Insurance verification completed.   The patient is insured through [facility] .   Per test claim: PA required; PA submitted to above mentioned insurance via CoverMyMeds Key/confirmation #/EOC [id number] Status is pending

## 2023-08-28 ENCOUNTER — Ambulatory Visit: Payer: BC Managed Care – PPO | Admitting: Neurology

## 2023-09-04 ENCOUNTER — Ambulatory Visit: Payer: BC Managed Care – PPO | Admitting: Neurology

## 2023-09-04 ENCOUNTER — Other Ambulatory Visit (HOSPITAL_COMMUNITY): Payer: Self-pay

## 2023-09-04 DIAGNOSIS — G43709 Chronic migraine without aura, not intractable, without status migrainosus: Secondary | ICD-10-CM

## 2023-09-04 MED ORDER — ONABOTULINUMTOXINA 100 UNITS IJ SOLR
200.0000 [IU] | Freq: Once | INTRAMUSCULAR | Status: AC
Start: 2023-09-04 — End: 2023-09-04
  Administered 2023-09-04: 155 [IU] via INTRAMUSCULAR

## 2023-09-04 NOTE — Progress Notes (Signed)

## 2023-09-04 NOTE — Telephone Encounter (Signed)
Pharmacy Patient Advocate Encounter  Received notification from [facility] that Prior Authorization for {Botox 200UNIT solutionhas been CANCELLED due to  per insurance Patient needs to call the number on the back of the card   PA #/Case ID/Reference #: [id number]

## 2023-09-07 NOTE — Telephone Encounter (Signed)
Patient advised of note below

## 2023-09-29 DIAGNOSIS — Z1231 Encounter for screening mammogram for malignant neoplasm of breast: Secondary | ICD-10-CM | POA: Diagnosis not present

## 2023-09-29 DIAGNOSIS — R92313 Mammographic fatty tissue density, bilateral breasts: Secondary | ICD-10-CM | POA: Diagnosis not present

## 2023-10-26 ENCOUNTER — Telehealth: Payer: BC Managed Care – PPO | Admitting: Nurse Practitioner

## 2023-10-26 DIAGNOSIS — N3000 Acute cystitis without hematuria: Secondary | ICD-10-CM

## 2023-10-26 MED ORDER — NITROFURANTOIN MONOHYD MACRO 100 MG PO CAPS
100.0000 mg | ORAL_CAPSULE | Freq: Two times a day (BID) | ORAL | 0 refills | Status: AC
Start: 2023-10-26 — End: 2023-10-31

## 2023-10-26 NOTE — Progress Notes (Signed)
E-Visit for Urinary Problems ? ?We are sorry that you are not feeling well.  Here is how we plan to help! ? ?Based on what you shared with me it looks like you most likely have a simple urinary tract infection. ? ?A UTI (Urinary Tract Infection) is a bacterial infection of the bladder. ? ?Most cases of urinary tract infections are simple to treat but a key part of your care is to encourage you to drink plenty of fluids and watch your symptoms carefully. ? ?I have prescribed MacroBid 100 mg twice a day for 5 days.  Your symptoms should gradually improve. Call us if the burning in your urine worsens, you develop worsening fever, back pain or pelvic pain or if your symptoms do not resolve after completing the antibiotic. ? ?Urinary tract infections can be prevented by drinking plenty of water to keep your body hydrated.  Also be sure when you wipe, wipe from front to back and don't hold it in!  If possible, empty your bladder every 4 hours. ? ?HOME CARE ?Drink plenty of fluids ?Compete the full course of the antibiotics even if the symptoms resolve ?Remember, when you need to go?go. Holding in your urine can increase the likelihood of getting a UTI! ?GET HELP RIGHT AWAY IF: ?You cannot urinate ?You get a high fever ?Worsening back pain occurs ?You see blood in your urine ?You feel sick to your stomach or throw up ?You feel like you are going to pass out ? ?MAKE SURE YOU  ?Understand these instructions. ?Will watch your condition. ?Will get help right away if you are not doing well or get worse. ? ? ?Thank you for choosing an e-visit. ? ?Your e-visit answers were reviewed by a board certified advanced clinical practitioner to complete your personal care plan. Depending upon the condition, your plan could have included both over the counter or prescription medications. ? ?Please review your pharmacy choice. Make sure the pharmacy is open so you can pick up prescription now. If there is a problem, you may contact your  provider through MyChart messaging and have the prescription routed to another pharmacy.  Your safety is important to us. If you have drug allergies check your prescription carefully.  ? ?For the next 24 hours you can use MyChart to ask questions about today's visit, request a non-urgent call back, or ask for a work or school excuse. ?You will get an email in the next two days asking about your experience. I hope that your e-visit has been valuable and will speed your recovery. ? ?I have spent 5 minutes in review of e-visit questionnaire, review and updating patient chart, medical decision making and response to patient.  ? ?Zainab Crumrine S Mayers, PA-C ? ? ? ? ?

## 2023-10-27 ENCOUNTER — Other Ambulatory Visit: Payer: Self-pay | Admitting: Neurology

## 2023-11-10 ENCOUNTER — Telehealth: Payer: Self-pay

## 2023-11-10 NOTE — Telephone Encounter (Signed)
PA status from 08/2023.

## 2023-11-19 NOTE — Progress Notes (Deleted)
 NEUROLOGY FOLLOW UP OFFICE NOTE  [name] [id number]  Assessment/Plan:   Migraine without aura, without status migrainosus, not intractable -    Migraine prevention:  Botox ***   Migraine rescue:  Tylenol/coffee first line, Ubrelvy first line or second line *** Limit use of pain relievers to no more than 2 days out of week to prevent risk of rebound or medication-overuse headache. Keep headache diary Follow up ***     Subjective:  [name] is a 58 year old leftft-handed female with hypertension and acquired hypothyroidism who follows up for migraine.   UPDATE: Doing well.  *** Intensity:  severe Duration:  1 hour with Tylenol and coffee, sometimes Ubrelvy  Frequency:  4 days a month Rescue therapy:  Tylenol/coffee first line, Ubrelvy second line Current NSAIDS: none Current analgesics: Tylenol, Excedrin, ibuprofen, tramadol Current triptans: none Current ergotamine: none Current anti-emetic: Promethazine 12.5 mg Current muscle relaxants: None Current Antihypertensive medications: Atenolol- chlorthalidone Current Antidepressant medications: None Current Anticonvulsant medications: Wellbutrin XL 150mg  daily Current anti-CGRP: Ubrelvy 100mg  Current Vitamins/Herbal/Supplements: None Current Antihistamines/Decongestants: None Other therapy: Caffeine for abortive therapy, daith piercing Hormone/birth control: None Other medications:  Hydroxyzine, Synthroid, Mounjaro   She reports chronic low back pain with pain radiating down the back of her left leg down to the ankle.  No preceding trauma.  No weakness.  She started exercising and working out 3 days a week and she has changed her diet.  She is doing better.   Caffeine: Coffee usually only for headache treatment Alcohol: None Smoker: None Diet: Sees nutritionist.  Keeps hydrated Exercise:Started exercising and working out 3 days a week with trainer. Depression: No; Anxiety: Mild.  Much better working from  home.   Other pain: No Sleep hygiene: Trouble sleeping.  Started hydroxyzine or magnesium powder which helps.   HISTORY:  Onset: She has had migraines since age 58, however they have steadily gotten worse over the past year and have been daily since last week. Location:  Varies(either side, holocephalic).  Over the past week, she has had left sided neck pain Quality:  pounding Initial Intensity:  10/10; December: 4/10 mild, 10/10 severe Aura:  no Prodrome:  no Associated symptoms: Nausea, photophobia, sometimes vomiting Iniitial Duration:  Several hours to 2 days Initial Frequency:  Usually 2-3 times a month Triggers: Menstrual cycle Relieving factors:  Sleep, medication Activity:  Cannot function   Past NSAIDS:  Naproxen; diclofenac, Cambia, Elyxyb Past analgesics:  Tylenol; hydrocodone Past abortive triptans:  Zomig 5mg  tablet (lost efficacy); Zomig 5mg  NS (effective but nasal spray irritant); sumatriptan NS; sumatriptan 100mg ; eletriptan; Zembrace SymTouch (effective but makes her drowsy); rizatriptan 10mg  Past abortive ergotamine:  Trudhesa NS Past muscle relaxants:  none Past anti-emetic:  none Past antihypertensive medications:  metoprolol Past antidepressant medications:  Nortriptyline  Past anticonvulsant medications:  Trokendi XR 100mg  Past anti-CGRP:  Aimovig 140mg , Nurtec Past vitamins/Herbal/Supplements:  Magnesium citrate; Co-Q 10; ribolfavin Past antihistamines/decongestants:  none Other past therapies:  none  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 Everlena Cooper seeing pt. for HA management    High blood pressure    HYPERTENSION 06/14/2007   Hypothyroidism 2018   thyroidectomy    Migraines    Nodular goiter    Obesity    PONV (postoperative nausea and vomiting)    Vitamin D deficiency     MEDICATIONS: Current Outpatient Medications on File Prior to Visit  Medication Sig Dispense Refill   aspirin-acetaminophen-caffeine (EXCEDRIN MIGRAINE) 250-250-65 MG tablet Take 2 tablets by mouth every 6 (six) hours as needed for headache.     atenolol-chlorthalidone (TENORETIC) 50-25 MG tablet Take by mouth.     botulinum toxin Type A (BOTOX) 200 units injection INJECT 155 UNITS INTRAMUSCULARLY TO FACE, HEAD, AND NECK EVERY 90 DAYS 1 each 4   buPROPion (WELLBUTRIN XL) 150 MG 24 hr tablet Take 150 mg by mouth daily.     hydrOXYzine (ATARAX) 10 MG tablet Take 10 mg by mouth every 8 (eight) hours as needed.     ibuprofen (ADVIL,MOTRIN) 200 MG tablet Take 800 mg by mouth every 8 (eight) hours as needed (for headache).     levothyroxine (SYNTHROID) 112 MCG tablet Take 112 mcg by mouth daily before breakfast.     lisinopril (ZESTRIL) 5 MG tablet Take 5 mg by mouth daily.     MOUNJARO 2.5 MG/0.5ML Pen Inject 2.5 mg into the skin once a week.     promethazine (PHENERGAN) 12.5 MG tablet Take 1 tablet (12.5 mg total) by mouth every 8 (eight) hours as needed for nausea or vomiting. 90 tablet 3   rosuvastatin (CRESTOR) 10 MG tablet Take 10 mg by mouth daily.     traMADol (ULTRAM) 50 MG tablet Take 50 mg by mouth every 12 (twelve) hours as needed.     Ubrogepant (UBRELVY) 100 MG TABS Take 100 mg by mouth as needed (As needed for Mirgrine.may repeat in 2 hours, maximum 2 tablets in 24 hours). 16 tablet 11   No current facility-administered medications on file prior to visit.    ALLERGIES: Allergies  Allergen Reactions   No Known Allergies    Gadolinium Derivatives Other (See Comments)    Temporary burning sensation radiating up arm    FAMILY HISTORY: Family History  Problem Relation Age of Onset   Diabetes Mother    Hypertension Mother    Thyroid disease Mother       Objective:  *** General: No acute distress.  Patient appears well-groomed.   Head:  Normocephalic/atraumatic Neck:  Supple.  No paraspinal tenderness.  Full range of motion. Heart:  Regular  rate and rhythm. Neuro:  Alert and oriented.  Speech fluent and not dysarthric.  Language intact.  CN II-XII intact.  Bulk and tone normal.  Muscle strength 5/5 throughout.  Deep tendon reflexes 2+ throughout.  Gait normal.  Romberg negative.   Shon Millet, DO

## 2023-11-20 ENCOUNTER — Encounter: Payer: Self-pay | Admitting: Neurology

## 2023-11-20 ENCOUNTER — Ambulatory Visit: Payer: BC Managed Care – PPO | Admitting: Neurology

## 2023-11-20 DIAGNOSIS — Z029 Encounter for administrative examinations, unspecified: Secondary | ICD-10-CM

## 2023-11-25 ENCOUNTER — Encounter: Payer: Self-pay | Admitting: Neurology

## 2023-12-03 NOTE — Progress Notes (Unsigned)
NEUROLOGY FOLLOW UP OFFICE NOTE  ARRYN Calhoun [id number]  Assessment/Plan:   Migraine without aura, without status migrainosus, not intractable -    Migraine prevention:  Botox ***   Migraine rescue:  Tylenol/coffee first line, Ubrelvy first line or second line *** Limit use of pain relievers to no more than 2 days out of week to prevent risk of rebound or medication-overuse headache. Keep headache diary Follow up ***     Subjective:  [name] is a 58 year old left-handed female with hypertension and acquired hypothyroidism who follows up for migraine.   UPDATE: Doing well.  *** Intensity:  severe Duration:  1 hour with Tylenol and coffee, sometimes Ubrelvy  Frequency:  4 days a month Rescue therapy:  Tylenol/coffee first line, Ubrelvy second line Current NSAIDS: none Current analgesics: Tylenol, Excedrin, ibuprofen, tramadol Current triptans: none Current ergotamine: none Current anti-emetic: Promethazine 12.5 mg Current muscle relaxants: None Current Antihypertensive medications: Atenolol- chlorthalidone Current Antidepressant medications: None Current Anticonvulsant medications: Wellbutrin XL 150mg  daily Current anti-CGRP: Ubrelvy 100mg  Current Vitamins/Herbal/Supplements: None Current Antihistamines/Decongestants: None Other therapy: Caffeine for abortive therapy, daith piercing Hormone/birth control: None Other medications:  Hydroxyzine, Synthroid, Mounjaro   She reports chronic low back pain with pain radiating down the back of her left leg down to the ankle.  No preceding trauma.  No weakness.  She started exercising and working out 3 days a week and she has changed her diet.  She is doing better.   Caffeine: Coffee usually only for headache treatment Alcohol: None Smoker: None Diet: Sees nutritionist.  Keeps hydrated Exercise:Started exercising and working out 3 days a week with trainer. Depression: No; Anxiety: Mild.  Much better working from  home.   Other pain: No Sleep hygiene: Trouble sleeping.  Started hydroxyzine or magnesium powder which helps.   HISTORY:  Onset: She has had migraines since age 61, however they have steadily gotten worse over the past year and have been daily since last week. Location:  Varies(either side, holocephalic).  Over the past week, she has had left sided neck pain Quality:  pounding Initial Intensity:  10/10; December: 4/10 mild, 10/10 severe Aura:  no Prodrome:  no Associated symptoms: Nausea, photophobia, sometimes vomiting Iniitial Duration:  Several hours to 2 days Initial Frequency:  Usually 2-3 times a month Triggers: Menstrual cycle Relieving factors:  Sleep, medication Activity:  Cannot function   Past NSAIDS:  Naproxen; diclofenac, Cambia, Elyxyb Past analgesics:  Tylenol; hydrocodone Past abortive triptans:  Zomig 5mg  tablet (lost efficacy); Zomig 5mg  NS (effective but nasal spray irritant); sumatriptan NS; sumatriptan 100mg ; eletriptan; Zembrace SymTouch (effective but makes her drowsy); rizatriptan 10mg  Past abortive ergotamine:  Trudhesa NS Past muscle relaxants:  none Past anti-emetic:  none Past antihypertensive medications:  metoprolol Past antidepressant medications:  Nortriptyline  Past anticonvulsant medications:  Trokendi XR 100mg  Past anti-CGRP:  Aimovig 140mg , Nurtec Past vitamins/Herbal/Supplements:  Magnesium citrate; Co-Q 10; ribolfavin Past antihistamines/decongestants:  none Other past therapies:  none  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 Everlena Cooper seeing pt. for HA management    High blood pressure    HYPERTENSION 06/14/2007   Hypothyroidism 2018   thyroidectomy    Migraines    Nodular goiter    Obesity    PONV (postoperative nausea and vomiting)    Vitamin D deficiency     MEDICATIONS: Current Outpatient Medications on File Prior to Visit  Medication Sig Dispense Refill   aspirin-acetaminophen-caffeine (EXCEDRIN MIGRAINE) 250-250-65 MG tablet Take 2 tablets by mouth every 6 (six) hours as needed for headache.     atenolol-chlorthalidone (TENORETIC) 50-25 MG tablet Take by mouth.     botulinum toxin Type A (BOTOX) 200 units injection INJECT 155 UNITS INTRAMUSCULARLY TO FACE, HEAD, AND NECK EVERY 90 DAYS 1 each 4   buPROPion (WELLBUTRIN XL) 150 MG 24 hr tablet Take 150 mg by mouth daily.     hydrOXYzine (ATARAX) 10 MG tablet Take 10 mg by mouth every 8 (eight) hours as needed.     ibuprofen (ADVIL,MOTRIN) 200 MG tablet Take 800 mg by mouth every 8 (eight) hours as needed (for headache).     levothyroxine (SYNTHROID) 112 MCG tablet Take 112 mcg by mouth daily before breakfast.     lisinopril (ZESTRIL) 5 MG tablet Take 5 mg by mouth daily.     MOUNJARO 2.5 MG/0.5ML Pen Inject 2.5 mg into the skin once a week.     promethazine (PHENERGAN) 12.5 MG tablet Take 1 tablet (12.5 mg total) by mouth every 8 (eight) hours as needed for nausea or vomiting. 90 tablet 3   rosuvastatin (CRESTOR) 10 MG tablet Take 10 mg by mouth daily.     traMADol (ULTRAM) 50 MG tablet Take 50 mg by mouth every 12 (twelve) hours as needed.     Ubrogepant (UBRELVY) 100 MG TABS Take 100 mg by mouth as needed (As needed for Mirgrine.may repeat in 2 hours, maximum 2 tablets in 24 hours). 16 tablet 11   No current facility-administered medications on file prior to visit.    ALLERGIES: Allergies  Allergen Reactions   No Known Allergies    Gadolinium Derivatives Other (See Comments)    Temporary burning sensation radiating up arm    FAMILY HISTORY: Family History  Problem Relation Age of Onset   Diabetes Mother    Hypertension Mother    Thyroid disease Mother       Objective:  *** General: No acute distress.  Patient appears well-groomed.   Head:  Normocephalic/atraumatic Neck:  Supple.  No paraspinal tenderness.  Full range of motion. Heart:  Regular  rate and rhythm. Neuro:  Alert and oriented.  Speech fluent and not dysarthric.  Language intact.  CN II-XII intact.  Bulk and tone normal.  Muscle strength 5/5 throughout.  Deep tendon reflexes 2+ throughout.  Gait normal.  Romberg negative.   Amber Millet, DO

## 2023-12-04 ENCOUNTER — Ambulatory Visit: Payer: BC Managed Care – PPO | Admitting: Neurology

## 2023-12-04 ENCOUNTER — Encounter: Payer: Self-pay | Admitting: Neurology

## 2023-12-04 VITALS — BP 115/56 | HR 82 | Ht 65.0 in | Wt 222.0 lb

## 2023-12-04 DIAGNOSIS — G43009 Migraine without aura, not intractable, without status migrainosus: Secondary | ICD-10-CM

## 2023-12-04 MED ORDER — UBRELVY 100 MG PO TABS: 100.0000 mg | ORAL_TABLET | ORAL | 11 refills | Status: AC | PRN

## 2023-12-04 MED ORDER — UBRELVY 100 MG PO TABS: 100.0000 mg | ORAL_TABLET | ORAL | 11 refills | Status: DC | PRN

## 2023-12-04 NOTE — Patient Instructions (Signed)
Restart Botox - scheduled for the 28th Take Tylenol or Ubrelvy as needed Consider magnesium citrate 400mg  daily, coQ10 300mg  daily and riboflavin 400mg  daily Follow up 6 months.

## 2023-12-06 ENCOUNTER — Other Ambulatory Visit (HOSPITAL_COMMUNITY): Payer: Self-pay

## 2023-12-06 ENCOUNTER — Telehealth: Payer: Self-pay | Admitting: Pharmacy Technician

## 2023-12-06 NOTE — Telephone Encounter (Signed)
 Pharmacy Patient Advocate Encounter  BotoxOne verification has been submitted. Benefit Verification #:   [id number]  Pharmacy PA has been submitted for BOTOX 200u via CoverMyMeds. INSURANCE: [employer] DATE SUBMITTED: 2.16.25 KEY: [id number] Status is pending

## 2023-12-09 ENCOUNTER — Telehealth: Payer: Self-pay | Admitting: Pharmacy Technician

## 2023-12-09 ENCOUNTER — Other Ambulatory Visit (HOSPITAL_COMMUNITY): Payer: Self-pay

## 2023-12-09 NOTE — Telephone Encounter (Signed)
 Pharmacy Patient Advocate Encounter   Received notification from Fax that prior authorization for UBRELVY 100MG  is required/requested.   Insurance verification completed.   The patient is insured through [facility] .   Per test claim: PA required; PA submitted to above mentioned insurance via CoverMyMeds Key/confirmation #/EOC [facility] Status is pending

## 2023-12-09 NOTE — Telephone Encounter (Signed)
 Pharmacy Patient Advocate Encounter  Received notification from [facility] that Prior Authorization for BOTOX 200 has been DENIED.  Full denial letter will be uploaded to the media tab. See denial reason below.   PA #/Case ID/Reference #:  [id number]  APPEAL HAS BEEN SUBMITTED EXPEDITED  KEY: [id number]  Submitted to [employer] also for procedure code PA. FAX: [phone]

## 2023-12-10 ENCOUNTER — Other Ambulatory Visit (HOSPITAL_COMMUNITY): Payer: Self-pay

## 2023-12-10 NOTE — Telephone Encounter (Signed)
 Pharmacy Patient Advocate Encounter- Botox BIV-Pharmacy Benefit:  PA was submitted to [employer] and has been approved through: 11.1.24 TO 5.20.25 Authorization# [id number]  Please send prescription to Specialty Pharmacy: [facility]: [phone]  Estimated Copay is: ?   Patient IS eligible for Botox Copay Card, which will make patient's copay as little as zero. Copay card will be provided to pharmacy.

## 2023-12-10 NOTE — Telephone Encounter (Signed)
 Delivery set for 12/14/23

## 2023-12-16 ENCOUNTER — Ambulatory Visit: Payer: BC Managed Care – PPO | Admitting: Neurology

## 2023-12-16 DIAGNOSIS — G43709 Chronic migraine without aura, not intractable, without status migrainosus: Secondary | ICD-10-CM

## 2023-12-16 MED ORDER — ONABOTULINUMTOXINA 100 UNITS IJ SOLR
200.0000 [IU] | Freq: Once | INTRAMUSCULAR | Status: AC
Start: 2023-12-16 — End: 2023-12-16
  Administered 2023-12-16: 155 [IU] via INTRAMUSCULAR

## 2023-12-16 NOTE — Progress Notes (Signed)

## 2023-12-25 ENCOUNTER — Other Ambulatory Visit (HOSPITAL_COMMUNITY): Payer: Self-pay

## 2024-01-18 ENCOUNTER — Encounter: Payer: Self-pay | Admitting: Neurology

## 2024-01-21 DIAGNOSIS — E1165 Type 2 diabetes mellitus with hyperglycemia: Secondary | ICD-10-CM | POA: Diagnosis not present

## 2024-01-21 DIAGNOSIS — E538 Deficiency of other specified B group vitamins: Secondary | ICD-10-CM | POA: Diagnosis not present

## 2024-01-21 DIAGNOSIS — Z23 Encounter for immunization: Secondary | ICD-10-CM | POA: Diagnosis not present

## 2024-01-21 DIAGNOSIS — E7800 Pure hypercholesterolemia, unspecified: Secondary | ICD-10-CM | POA: Diagnosis not present

## 2024-01-21 DIAGNOSIS — I10 Essential (primary) hypertension: Secondary | ICD-10-CM | POA: Diagnosis not present

## 2024-01-21 DIAGNOSIS — E559 Vitamin D deficiency, unspecified: Secondary | ICD-10-CM | POA: Diagnosis not present

## 2024-01-21 DIAGNOSIS — E890 Postprocedural hypothyroidism: Secondary | ICD-10-CM | POA: Diagnosis not present

## 2024-02-02 NOTE — Telephone Encounter (Signed)
 Pharmacy Patient Advocate Encounter  Received notification from [employer] that Prior Authorization for UBRELVY 100MG  has been APPROVED from 3.22.25 to 3.22.26   PA #/Case ID/Reference #: [id number]

## 2024-03-08 ENCOUNTER — Telehealth: Admitting: Physician Assistant

## 2024-03-08 DIAGNOSIS — R3989 Other symptoms and signs involving the genitourinary system: Secondary | ICD-10-CM

## 2024-03-08 MED ORDER — CEPHALEXIN 500 MG PO CAPS: 500.0000 mg | ORAL_CAPSULE | Freq: Two times a day (BID) | ORAL | 0 refills | Status: AC

## 2024-03-08 NOTE — Progress Notes (Signed)
 I have spent 5 minutes in review of e-visit questionnaire, review and updating patient chart, medical decision making and response to patient.   Piedad Climes, PA-C

## 2024-03-08 NOTE — Progress Notes (Signed)

## 2024-03-22 ENCOUNTER — Telehealth: Payer: Self-pay

## 2024-03-22 NOTE — Telephone Encounter (Signed)
 PA needed Botox 

## 2024-03-25 ENCOUNTER — Telehealth: Payer: Self-pay | Admitting: Pharmacy Technician

## 2024-03-25 NOTE — Telephone Encounter (Signed)
 PA has been submitted, and telephone encounter has been created. Please see telephone encounter dated 6.6.25.

## 2024-03-25 NOTE — Telephone Encounter (Signed)
 Pharmacy Patient Advocate Encounter  Pharmacy Benefit PA has been submitted for Botox - [phone] via CoverMyMeds.  INSURANCE: [employer] KEY/EOC/FAX: [id number] Procedure code 11914  Pending BotoxOne report require a PA Status is Submitted  Pharmacy Patient Advocate Encounter   Botox  One Portal verification has been Submitted Benefit Verification #:  [id number]   Primary Insurance: [employer] Dx Code: G43.709 J-code: Botox - Z3086 Procedure code: [phone]

## 2024-03-31 ENCOUNTER — Telehealth: Payer: Self-pay | Admitting: Neurology

## 2024-04-01 ENCOUNTER — Ambulatory Visit: Payer: BC Managed Care – PPO | Admitting: Neurology

## 2024-04-01 DIAGNOSIS — G43709 Chronic migraine without aura, not intractable, without status migrainosus: Secondary | ICD-10-CM | POA: Diagnosis not present

## 2024-04-01 MED ORDER — ONABOTULINUMTOXINA 100 UNITS IJ SOLR
200.0000 [IU] | Freq: Once | INTRAMUSCULAR | Status: AC
  Administered 2024-04-01: 155 [IU] via INTRAMUSCULAR

## 2024-04-01 NOTE — Progress Notes (Signed)

## 2024-04-07 DIAGNOSIS — N390 Urinary tract infection, site not specified: Secondary | ICD-10-CM | POA: Diagnosis not present

## 2024-04-07 DIAGNOSIS — A499 Bacterial infection, unspecified: Secondary | ICD-10-CM | POA: Diagnosis not present

## 2024-04-07 DIAGNOSIS — R350 Frequency of micturition: Secondary | ICD-10-CM | POA: Diagnosis not present

## 2024-04-07 DIAGNOSIS — I10 Essential (primary) hypertension: Secondary | ICD-10-CM | POA: Diagnosis not present

## 2024-04-07 DIAGNOSIS — R300 Dysuria: Secondary | ICD-10-CM | POA: Diagnosis not present

## 2024-04-08 DIAGNOSIS — R350 Frequency of micturition: Secondary | ICD-10-CM | POA: Diagnosis not present

## 2024-04-08 DIAGNOSIS — R300 Dysuria: Secondary | ICD-10-CM | POA: Diagnosis not present

## 2024-04-08 DIAGNOSIS — N390 Urinary tract infection, site not specified: Secondary | ICD-10-CM | POA: Diagnosis not present

## 2024-05-25 ENCOUNTER — Other Ambulatory Visit (HOSPITAL_COMMUNITY): Payer: Self-pay

## 2024-05-25 NOTE — Progress Notes (Unsigned)
 NEUROLOGY FOLLOW UP OFFICE NOTE  [name] [id number]  Assessment/Plan:   Migraine without aura, without status migrainosus, not intractable - stable   Migraine prevention:  Botox   Migraine rescue:  Tylenol /coffee first line, Ubrelvy  first line or second line  Limit use of pain relievers to no more than 2 days out of week to prevent risk of rebound or medication-overuse headache. Keep headache diary Follow up 1 year     Subjective:  [name] is a 58 year old left-handed female with hypertension and acquired hypothyroidism who follows up for migraine.   UPDATE: *** Intensity:  severe Duration:  1 hour with Ubrelvy . Frequency: *** Rescue therapy:  Tylenol /coffee first line, Ubrelvy  second line Current NSAIDS: none Current analgesics: Tylenol , Excedrin, ibuprofen , tramadol  Current triptans: none Current ergotamine: none Current anti-emetic: Promethazine  12.5 mg Current muscle relaxants: None Current Antihypertensive medications: Atenolol - chlorthalidone  Current Antidepressant medications: None Current Anticonvulsant medications: Wellbutrin  XL 150mg  daily Current anti-CGRP: Ubrelvy  100mg  Current Vitamins/Herbal/Supplements: None Current Antihistamines/Decongestants: None Other therapy: Caffeine for abortive therapy, daith piercing Hormone/birth control: None Other medications:  Hydroxyzine, Synthroid , Mounjaro   She reports chronic low back pain with pain radiating down the back of her left leg down to the ankle.  No preceding trauma.  No weakness.  She started exercising and working out 3 days a week and she has changed her diet.  She is doing better.   Caffeine: Coffee usually only for headache treatment Alcohol: None Smoker: None Diet: Sees nutritionist.  Keeps hydrated Exercise:Started exercising and working out 3 days a week with trainer. Depression: No; Anxiety: Mild.  Much better working from home.   Other pain: No Sleep hygiene: Trouble sleeping.   Started hydroxyzine or magnesium powder which helps.   HISTORY:  Onset: She has had migraines since age 32, however they have steadily gotten worse over the past year and have been daily since last week. Location:  Varies(either side, holocephalic).  Over the past week, she has had left sided neck pain Quality:  pounding Initial Intensity:  10/10; December: 4/10 mild, 10/10 severe Aura:  no Prodrome:  no Associated symptoms: Nausea, photophobia, sometimes vomiting Iniitial Duration:  Several hours to 2 days Initial Frequency:  Usually 2-3 times a month Triggers: Menstrual cycle Relieving factors:  Sleep, medication Activity:  Cannot function   Past NSAIDS:  Naproxen ; diclofenac , Cambia , Elyxyb  Past analgesics:  Tylenol ; hydrocodone  Past abortive triptans:  Zomig  5mg  tablet (lost efficacy); Zomig  5mg  NS (effective but nasal spray irritant); sumatriptan  NS; sumatriptan  100mg ; eletriptan ; Zembrace SymTouch  (effective but makes her drowsy); rizatriptan  10mg  Past abortive ergotamine:  Trudhesa  NS Past muscle relaxants:  none Past anti-emetic:  none Past antihypertensive medications:  metoprolol  Past antidepressant medications:  Nortriptyline   Past anticonvulsant medications:  Trokendi  XR 100mg  Past anti-CGRP:  Aimovig  140mg , Nurtec Past vitamins/Herbal/Supplements:  Magnesium citrate; Co-Q 10; ribolfavin Past antihistamines/decongestants:  none Other past therapies:  none  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 Skeet seeing pt. for HA management    High blood pressure    HYPERTENSION 06/14/2007   Hypothyroidism 2018   thyroidectomy    Migraines    Nodular goiter    Obesity    PONV (postoperative nausea and vomiting)    Vitamin D  deficiency     MEDICATIONS: Current Outpatient Medications on File Prior to Visit  Medication Sig Dispense Refill   aspirin-acetaminophen -caffeine  (EXCEDRIN MIGRAINE) 250-250-65 MG tablet  Take 2 tablets by mouth every 6 (six) hours as needed for headache.     atenolol -chlorthalidone  (TENORETIC ) 50-25 MG tablet Take by mouth.     botulinum toxin Type A  (BOTOX ) 200 units injection INJECT 155 UNITS INTRAMUSCULARLY TO FACE, HEAD, AND NECK EVERY 90 DAYS 1 each 4   buPROPion  (WELLBUTRIN  XL) 150 MG 24 hr tablet Take 150 mg by mouth daily.     hydrOXYzine (ATARAX) 10 MG tablet Take 10 mg by mouth every 8 (eight) hours as needed.     ibuprofen  (ADVIL ,MOTRIN ) 200 MG tablet Take 800 mg by mouth every 8 (eight) hours as needed (for headache).     levothyroxine  (SYNTHROID ) 112 MCG tablet Take 112 mcg by mouth daily before breakfast.     lisinopril (ZESTRIL) 5 MG tablet Take 5 mg by mouth daily.     MOUNJARO 2.5 MG/0.5ML Pen Inject 2.5 mg into the skin once a week.     promethazine  (PHENERGAN ) 12.5 MG tablet Take 1 tablet (12.5 mg total) by mouth every 8 (eight) hours as needed for nausea or vomiting. 90 tablet 3   rosuvastatin (CRESTOR) 10 MG tablet Take 10 mg by mouth daily.     Ubrogepant  (UBRELVY ) 100 MG TABS Take 1 tablet (100 mg total) by mouth as needed (As needed for Mirgrine.may repeat in 2 hours, maximum 2 tablets in 24 hours). 16 tablet 11   No current facility-administered medications on file prior to visit.    ALLERGIES: Allergies  Allergen Reactions   No Known Allergies    Gadolinium Derivatives Other (See Comments)    Temporary burning sensation radiating up arm    FAMILY HISTORY: Family History  Problem Relation Age of Onset   Diabetes Mother    Hypertension Mother    Thyroid  disease Mother       Objective:  *** General: No acute distress.  Patient appears well-groomed.      Juliene Dunnings, DO

## 2024-05-26 ENCOUNTER — Encounter: Payer: Self-pay | Admitting: Neurology

## 2024-05-26 ENCOUNTER — Ambulatory Visit: Admitting: Neurology

## 2024-05-26 VITALS — BP 127/87 | HR 88 | Resp 20 | Ht 65.0 in | Wt 226.0 lb

## 2024-05-26 DIAGNOSIS — G43009 Migraine without aura, not intractable, without status migrainosus: Secondary | ICD-10-CM

## 2024-06-06 ENCOUNTER — Telehealth: Payer: Self-pay

## 2024-06-06 NOTE — Telephone Encounter (Signed)
 PA needed for Botox

## 2024-06-13 NOTE — Telephone Encounter (Signed)
 Submitted a Prior Authorization request to [employer] for Botox  200 unit via Latent Will update once we receive a response.  Key: [id number]  Submitted for updated Botox  One report-  [id number]

## 2024-06-14 NOTE — Telephone Encounter (Signed)
 PA still pending.

## 2024-06-15 NOTE — Telephone Encounter (Signed)
 Pharmacy Patient Advocate Encounter  Received notification from [employer] that Prior Authorization for Botox  has been DENIED.  See denial reason below. No denial letter attached in CMM. Will attach denial letter to Media tab once received.  Per your health plan's criteria, this drug is covered if you meet the following: (1) Since starting Botox , you use less of this category of drugs [for example: nonsteroidal antiinflammatory drugs (NSAIDs) (for example: ibuprofen , naproxen ), triptans (for example: eletriptan , rizatriptan , sumatriptan )]. The information provided does not show that you meet the criteria listed above. Please speak with your doctor about your options. Reviewed by: R.Ph.  PA #/Case ID/Reference #: [id number]  05/26/24 chartnotes were submitted with renewal PA, routing to [employer] for appeal.

## 2024-06-16 ENCOUNTER — Telehealth: Payer: Self-pay | Admitting: Pharmacist

## 2024-06-16 NOTE — Telephone Encounter (Signed)
 error

## 2024-06-16 NOTE — Telephone Encounter (Signed)
 An E-Appeal has been submitted for Botox . Will advise when response is received, please be advised that most companies may take 30 days to make a decision. Appeal letter and supporting documentation were uploaded and submitted via CMM website on 06/16/2024 @1 :52 pm.  Thank you, Devere Pandy, PharmD Clinical Pharmacist  [place]  Direct Dial: [phone]

## 2024-06-17 ENCOUNTER — Ambulatory Visit: Payer: BC Managed Care – PPO | Admitting: Neurology

## 2024-06-21 ENCOUNTER — Other Ambulatory Visit (HOSPITAL_COMMUNITY): Payer: Self-pay

## 2024-06-21 NOTE — Addendum Note (Signed)
 Addended byBETHA DUNNINGS, Rosemond Lyttle R on: 06/21/2024 11:54 AM   Modules accepted: Level of Service

## 2024-06-23 NOTE — Telephone Encounter (Signed)
 Insurance approved the appeal for Botox  through 09/16/2024.    Thank you, Devere Pandy, PharmD Clinical Pharmacist  [place]  Direct Dial: [phone]

## 2024-06-24 ENCOUNTER — Other Ambulatory Visit (HOSPITAL_COMMUNITY): Payer: Self-pay

## 2024-06-24 MED ORDER — BOTOX 200 UNITS IJ SOLR: INTRAMUSCULAR | 4 refills | Status: AC

## 2024-06-24 NOTE — Addendum Note (Signed)
 Addended by: OZELL JESUSA PARAS on: 06/24/2024 12:04 PM   Modules accepted: Orders

## 2024-07-01 ENCOUNTER — Ambulatory Visit (INDEPENDENT_AMBULATORY_CARE_PROVIDER_SITE_OTHER): Admitting: Neurology

## 2024-07-01 DIAGNOSIS — G43709 Chronic migraine without aura, not intractable, without status migrainosus: Secondary | ICD-10-CM | POA: Diagnosis not present

## 2024-07-01 MED ORDER — ONABOTULINUMTOXINA 100 UNITS IJ SOLR
200.0000 [IU] | Freq: Once | INTRAMUSCULAR | Status: AC
  Administered 2024-07-01: 155 [IU] via INTRAMUSCULAR

## 2024-07-01 NOTE — Progress Notes (Signed)

## 2024-09-06 ENCOUNTER — Telehealth: Payer: Self-pay | Admitting: Neurology

## 2024-09-06 NOTE — Telephone Encounter (Signed)
 Delivery set botox  09/07/24.

## 2024-09-06 NOTE — Telephone Encounter (Signed)
 Left a message with the after hour service on 09-05-24 at 4:45 pm   Caller is calling to schedule Botox  for patient   [name] from [place] [facility]   [phone]

## 2024-09-08 ENCOUNTER — Telehealth: Payer: Self-pay | Admitting: Pharmacy Technician

## 2024-09-08 NOTE — Telephone Encounter (Addendum)
 Pharmacy Patient Advocate Encounter   Received notification from CoverMyMeds that prior authorization for BOTOX  200 is required/requested.   Insurance verification completed.   The patient is insured through [facility].   Per test claim: PA required; PA submitted to above mentioned insurance via Latent Key/confirmation #/EOC [id number] Status is pending  Benefit Verification [id number] Submitted

## 2024-09-09 ENCOUNTER — Other Ambulatory Visit (HOSPITAL_COMMUNITY): Payer: Self-pay

## 2024-09-09 NOTE — Telephone Encounter (Signed)
 Pharmacy Patient Advocate Encounter- Injection via Pharmacy Benefit:  PA was submitted  for Botox - [id number] to [facility] and has been approved through: 11.20.25 TO 11.20.26 Authorization# [id number]  Please send prescription to Specialty Pharmacy: [facility]: [phone]  Estimated Pharmacy Copay is: -

## 2024-09-30 ENCOUNTER — Ambulatory Visit: Admitting: Neurology

## 2024-09-30 DIAGNOSIS — G43709 Chronic migraine without aura, not intractable, without status migrainosus: Secondary | ICD-10-CM | POA: Diagnosis not present

## 2024-09-30 MED ORDER — ONABOTULINUMTOXINA 100 UNITS IJ SOLR
200.0000 [IU] | Freq: Once | INTRAMUSCULAR | Status: AC
  Administered 2024-09-30: 155 [IU] via INTRAMUSCULAR

## 2024-09-30 NOTE — Progress Notes (Signed)

## 2024-11-20 ENCOUNTER — Telehealth: Admitting: Family

## 2024-11-20 ENCOUNTER — Telehealth

## 2024-11-20 DIAGNOSIS — R399 Unspecified symptoms and signs involving the genitourinary system: Secondary | ICD-10-CM

## 2024-11-20 DIAGNOSIS — R300 Dysuria: Secondary | ICD-10-CM

## 2024-11-20 MED ORDER — CEPHALEXIN 500 MG PO CAPS: 500.0000 mg | ORAL_CAPSULE | Freq: Two times a day (BID) | ORAL | 0 refills | Status: AC

## 2024-11-20 NOTE — Progress Notes (Signed)

## 2024-12-30 ENCOUNTER — Ambulatory Visit: Admitting: Neurology

## 2025-05-26 ENCOUNTER — Ambulatory Visit: Admitting: Neurology
